# Patient Record
Sex: Male | Born: 1945 | Race: White | Hispanic: No | Marital: Married | Smoking: Never smoker
Health system: Southern US, Community
[De-identification: ages and names within clinical notes are randomized; demographics above are authoritative.]

## PROBLEM LIST (undated history)

## (undated) DIAGNOSIS — T7840XA Allergy, unspecified, initial encounter: Secondary | ICD-10-CM

## (undated) DIAGNOSIS — J45909 Unspecified asthma, uncomplicated: Secondary | ICD-10-CM

## (undated) DIAGNOSIS — I509 Heart failure, unspecified: Secondary | ICD-10-CM

## (undated) DIAGNOSIS — I219 Acute myocardial infarction, unspecified: Secondary | ICD-10-CM

## (undated) DIAGNOSIS — M1990 Unspecified osteoarthritis, unspecified site: Secondary | ICD-10-CM

## (undated) DIAGNOSIS — I10 Essential (primary) hypertension: Secondary | ICD-10-CM

## (undated) DIAGNOSIS — G4730 Sleep apnea, unspecified: Secondary | ICD-10-CM

## (undated) HISTORY — PX: ROTATOR CUFF REPAIR: SHX139

## (undated) HISTORY — DX: Allergy, unspecified, initial encounter: T78.40XA

## (undated) HISTORY — PX: TONSILLECTOMY: SUR1361

---

## 1988-11-19 HISTORY — PX: FINGER ARTHROSCOPY: SHX5001

## 2004-11-19 HISTORY — PX: BACK SURGERY: SHX140

## 2005-06-08 ENCOUNTER — Ambulatory Visit (HOSPITAL_COMMUNITY): Admission: RE | Admit: 2005-06-08 | Discharge: 2005-06-09 | Payer: Self-pay | Admitting: Neurosurgery

## 2006-11-04 ENCOUNTER — Encounter: Admission: RE | Admit: 2006-11-04 | Discharge: 2006-11-04 | Payer: Self-pay | Admitting: Family Medicine

## 2007-07-18 ENCOUNTER — Emergency Department: Payer: Self-pay | Admitting: Emergency Medicine

## 2011-05-16 ENCOUNTER — Emergency Department: Payer: Self-pay | Admitting: Emergency Medicine

## 2011-06-11 ENCOUNTER — Encounter: Payer: Self-pay | Admitting: Physician Assistant

## 2011-06-20 ENCOUNTER — Encounter: Payer: Self-pay | Admitting: Physician Assistant

## 2011-07-21 ENCOUNTER — Encounter: Payer: Self-pay | Admitting: Physician Assistant

## 2011-09-06 ENCOUNTER — Ambulatory Visit: Payer: Self-pay | Admitting: Orthopedic Surgery

## 2015-11-14 DIAGNOSIS — K429 Umbilical hernia without obstruction or gangrene: Secondary | ICD-10-CM | POA: Diagnosis not present

## 2015-11-14 DIAGNOSIS — K8000 Calculus of gallbladder with acute cholecystitis without obstruction: Secondary | ICD-10-CM | POA: Diagnosis not present

## 2015-11-14 DIAGNOSIS — K8020 Calculus of gallbladder without cholecystitis without obstruction: Secondary | ICD-10-CM | POA: Diagnosis not present

## 2015-11-15 ENCOUNTER — Emergency Department: Payer: Medicare Other | Admitting: Anesthesiology

## 2015-11-15 ENCOUNTER — Emergency Department: Payer: Medicare Other

## 2015-11-15 ENCOUNTER — Observation Stay
Admission: EM | Admit: 2015-11-15 | Discharge: 2015-11-16 | Disposition: A | Discharge status: Home / Self Care | Payer: Medicare Other | Attending: General Surgery | Admitting: General Surgery

## 2015-11-15 ENCOUNTER — Encounter
Admission: EM | Disposition: A | Discharge status: Home / Self Care | Payer: Self-pay | Source: Home / Self Care | Attending: Emergency Medicine

## 2015-11-15 ENCOUNTER — Encounter: Payer: Self-pay | Admitting: Emergency Medicine

## 2015-11-15 DIAGNOSIS — R1013 Epigastric pain: Secondary | ICD-10-CM | POA: Diagnosis not present

## 2015-11-15 DIAGNOSIS — K8000 Calculus of gallbladder with acute cholecystitis without obstruction: Secondary | ICD-10-CM | POA: Diagnosis not present

## 2015-11-15 DIAGNOSIS — R918 Other nonspecific abnormal finding of lung field: Secondary | ICD-10-CM | POA: Insufficient documentation

## 2015-11-15 DIAGNOSIS — J449 Chronic obstructive pulmonary disease, unspecified: Secondary | ICD-10-CM | POA: Insufficient documentation

## 2015-11-15 DIAGNOSIS — Z7982 Long term (current) use of aspirin: Secondary | ICD-10-CM | POA: Insufficient documentation

## 2015-11-15 DIAGNOSIS — Z79899 Other long term (current) drug therapy: Secondary | ICD-10-CM | POA: Insufficient documentation

## 2015-11-15 DIAGNOSIS — R079 Chest pain, unspecified: Secondary | ICD-10-CM | POA: Insufficient documentation

## 2015-11-15 DIAGNOSIS — N644 Mastodynia: Secondary | ICD-10-CM | POA: Diagnosis not present

## 2015-11-15 DIAGNOSIS — K810 Acute cholecystitis: Secondary | ICD-10-CM

## 2015-11-15 DIAGNOSIS — I10 Essential (primary) hypertension: Secondary | ICD-10-CM | POA: Diagnosis present

## 2015-11-15 DIAGNOSIS — K429 Umbilical hernia without obstruction or gangrene: Secondary | ICD-10-CM | POA: Insufficient documentation

## 2015-11-15 HISTORY — PX: CHOLECYSTECTOMY: SHX55

## 2015-11-15 HISTORY — DX: Sleep apnea, unspecified: G47.30

## 2015-11-15 HISTORY — PX: UMBILICAL HERNIA REPAIR: SHX196

## 2015-11-15 HISTORY — DX: Essential (primary) hypertension: I10

## 2015-11-15 LAB — LIPASE, BLOOD: LIPASE: 31 U/L (ref 11–51)

## 2015-11-15 LAB — CBC
HEMATOCRIT: 48.9 % (ref 40.0–52.0)
HEMOGLOBIN: 16.4 g/dL (ref 13.0–18.0)
MCH: 30.9 pg (ref 26.0–34.0)
MCHC: 33.5 g/dL (ref 32.0–36.0)
MCV: 92.1 fL (ref 80.0–100.0)
Platelets: 114 10*3/uL — ABNORMAL LOW (ref 150–440)
RBC: 5.31 MIL/uL (ref 4.40–5.90)
RDW: 14 % (ref 11.5–14.5)
WBC: 9 10*3/uL (ref 3.8–10.6)

## 2015-11-15 LAB — HEPATIC FUNCTION PANEL
ALBUMIN: 4.1 g/dL (ref 3.5–5.0)
ALK PHOS: 52 U/L (ref 38–126)
ALT: 28 U/L (ref 17–63)
AST: 23 U/L (ref 15–41)
Bilirubin, Direct: <0.1 mg/dL — ABNORMAL LOW (ref 0.1–0.5)
TOTAL PROTEIN: 6.7 g/dL (ref 6.5–8.1)
Total Bilirubin: 0.5 mg/dL (ref 0.3–1.2)

## 2015-11-15 LAB — TROPONIN I: Troponin I: <0.03 ng/mL (ref <0.031)

## 2015-11-15 LAB — BASIC METABOLIC PANEL
Anion gap: 7 (ref 5–15)
BUN: 15 mg/dL (ref 6–20)
CHLORIDE: 105 mmol/L (ref 101–111)
CO2: 25 mmol/L (ref 22–32)
CREATININE: 1.17 mg/dL (ref 0.61–1.24)
Calcium: 8.8 mg/dL — ABNORMAL LOW (ref 8.9–10.3)
GFR calc non Af Amer: >60 mL/min (ref >60)
Glucose, Bld: 107 mg/dL — ABNORMAL HIGH (ref 65–99)
POTASSIUM: 3.9 mmol/L (ref 3.5–5.1)
SODIUM: 137 mmol/L (ref 135–145)

## 2015-11-15 SURGERY — LAPAROSCOPIC CHOLECYSTECTOMY WITH INTRAOPERATIVE CHOLANGIOGRAM
Anesthesia: General | Site: Abdomen | Wound class: Clean Contaminated

## 2015-11-15 MED ORDER — LACTATED RINGERS IV SOLN
INTRAVENOUS | Status: DC | PRN
  Administered 2015-11-15 (×2): via INTRAVENOUS

## 2015-11-15 MED ORDER — LABETALOL HCL 5 MG/ML IV SOLN: 10.0000 mg | Freq: Once | INTRAVENOUS | Status: DC

## 2015-11-15 MED ORDER — SODIUM CHLORIDE 0.9 % IJ SOLN
INTRAMUSCULAR | Status: AC
  Filled 2015-11-15: qty 50

## 2015-11-15 MED ORDER — ONDANSETRON HCL 4 MG/2ML IJ SOLN
4.0000 mg | Freq: Once | INTRAMUSCULAR | Status: AC
  Administered 2015-11-15: 4 mg via INTRAVENOUS
  Filled 2015-11-15: qty 2

## 2015-11-15 MED ORDER — CEFAZOLIN SODIUM-DEXTROSE 2-3 GM-% IV SOLR
2.0000 g | Freq: Once | INTRAVENOUS | Status: AC
Start: 2015-11-15 — End: 2015-11-15
  Administered 2015-11-15: 2 g via INTRAVENOUS
  Filled 2015-11-15 (×2): qty 50

## 2015-11-15 MED ORDER — SODIUM CHLORIDE 0.9 % IV SOLN
Freq: Once | INTRAVENOUS | Status: AC
  Administered 2015-11-15: 21:00:00 via INTRAVENOUS

## 2015-11-15 MED ORDER — ACETAMINOPHEN 10 MG/ML IV SOLN
INTRAVENOUS | Status: AC
  Filled 2015-11-15: qty 100

## 2015-11-15 MED ORDER — MORPHINE SULFATE (PF) 4 MG/ML IV SOLN
4.0000 mg | Freq: Once | INTRAVENOUS | Status: AC
  Administered 2015-11-15: 4 mg via INTRAVENOUS
  Filled 2015-11-15: qty 1

## 2015-11-15 SURGICAL SUPPLY — 38 items
ANCHOR TIS RET SYS 235ML (MISCELLANEOUS) ×5 IMPLANT
APPLICATOR SURGIFLO (MISCELLANEOUS) IMPLANT
APPLIER CLIP LOGIC TI 5 (MISCELLANEOUS) ×5 IMPLANT
BLADE SURG 11 STRL SS SAFETY (MISCELLANEOUS) ×5 IMPLANT
CANISTER SUCT 1200ML W/VALVE (MISCELLANEOUS) ×5 IMPLANT
CANNULA DILATOR 10 W/SLV (CANNULA) ×4 IMPLANT
CANNULA DILATOR 10MM W/SLV (CANNULA) ×1
CATH CHOLANG 76X19 KUMAR (CATHETERS) IMPLANT
CHLORAPREP W/TINT 26ML (MISCELLANEOUS) ×5 IMPLANT
CLOSURE WOUND 1/2 X4 (GAUZE/BANDAGES/DRESSINGS) ×1
DEFOGGER SCOPE WARMER CLEARIFY (MISCELLANEOUS) ×5 IMPLANT
DRAPE C-ARM XRAY 36X54 (DRAPES) IMPLANT
DRAPE INCISE IOBAN 66X45 STRL (DRAPES) ×5 IMPLANT
DRESSING TELFA 4X3 1S ST N-ADH (GAUZE/BANDAGES/DRESSINGS) ×5 IMPLANT
DRSG TEGADERM 2-3/8X2-3/4 SM (GAUZE/BANDAGES/DRESSINGS) ×20 IMPLANT
GLOVE BIO SURGEON STRL SZ7 (GLOVE) ×25 IMPLANT
GOWN STRL REUS W/ TWL LRG LVL3 (GOWN DISPOSABLE) ×9 IMPLANT
GOWN STRL REUS W/TWL LRG LVL3 (GOWN DISPOSABLE) ×6
GRASPER SUT TROCAR 14GX15 (MISCELLANEOUS) IMPLANT
HEMOSTAT SURGICEL 2X3 (HEMOSTASIS) IMPLANT
IRRIGATION STRYKERFLOW (MISCELLANEOUS) ×3 IMPLANT
IRRIGATOR STRYKERFLOW (MISCELLANEOUS) ×5
IV LACTATED RINGERS 1000ML (IV SOLUTION) ×5 IMPLANT
KIT RM TURNOVER STRD PROC AR (KITS) ×5 IMPLANT
LABEL OR SOLS (LABEL) ×5 IMPLANT
NDL INSUFF ACCESS 14 VERSASTEP (NEEDLE) ×5 IMPLANT
PACK LAP CHOLECYSTECTOMY (MISCELLANEOUS) ×5 IMPLANT
PAD GROUND ADULT SPLIT (MISCELLANEOUS) ×5 IMPLANT
SCISSORS METZENBAUM CVD 33 (INSTRUMENTS) ×5 IMPLANT
SLEEVE ENDOPATH XCEL 5M (ENDOMECHANICALS) ×10 IMPLANT
SPOGE SURGIFLO 8M (HEMOSTASIS)
SPONGE SURGIFLO 8M (HEMOSTASIS) IMPLANT
STRIP CLOSURE SKIN 1/2X4 (GAUZE/BANDAGES/DRESSINGS) ×4 IMPLANT
SUT VIC AB 0 SH 27 (SUTURE) ×5 IMPLANT
SUT VIC AB 4-0 FS2 27 (SUTURE) ×5 IMPLANT
SWABSTK COMLB BENZOIN TINCTURE (MISCELLANEOUS) ×5 IMPLANT
TROCAR XCEL NON-BLD 5MMX100MML (ENDOMECHANICALS) ×5 IMPLANT
TUBING INSUFFLATOR HI FLOW (MISCELLANEOUS) ×5 IMPLANT

## 2015-11-15 NOTE — ED Notes (Signed)
Lab called for add on of blood work.

## 2015-11-15 NOTE — Anesthesia Preprocedure Evaluation (Signed)
Anesthesia Evaluation  Patient identified by MRN, date of birth, ID band Patient awake    Reviewed: Allergy & Precautions, NPO status , Patient's Chart, lab work & pertinent test results, reviewed documented beta blocker date and time   History of Anesthesia Complications Negative for: history of anesthetic complications  Airway Mallampati: III       Dental   Pulmonary sleep apnea and Continuous Positive Airway Pressure Ventilation , COPD,  COPD inhaler,           Cardiovascular hypertension, Pt. on medications and Pt. 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      Musculoskeletal   Abdominal   Peds  Hematology negative hematology ROS (+)   Anesthesia Other Findings   Reproductive/Obstetrics                             Anesthesia Physical Anesthesia Plan  ASA: III and emergent  Anesthesia Plan: General   Post-op Pain Management:    Induction: Intravenous  Airway Management Planned: Oral ETT  Additional Equipment:   Intra-op Plan:   Post-operative Plan:   Informed Consent: I have reviewed the patients History and Physical, chart, labs and discussed the procedure including the risks, benefits and alternatives for the proposed anesthesia with the patient or authorized representative who has indicated his/her understanding and acceptance.     Plan Discussed with:   Anesthesia Plan Comments:         Anesthesia Quick Evaluation

## 2015-11-15 NOTE — H&P (Signed)
Walter Anderson is an 69 y.o. male.   Chief Complaint: Abdominal pain HPI: This is a 69 year old male who in November had an episode of severe pain in the epigastric region. He could not get comfortable but the pain passed away in a couple of hours. About 3 days ago he had another episode of similar pain and lasted a couple of hours and resolved again. This morning around 7-8:00 he developed the same epigastric pain but this time have persisted and constant fashion. The pain is localized this region and does not radiate. No associated symptoms of  nausea or vomiting fever or chills.  Past Medical History  Diagnosis Date  . Hypertension   . COPD (chronic obstructive pulmonary disease) ([place])     History reviewed. No pertinent past surgical history.  History reviewed. No pertinent family history. Social History:  reports that he has never smoked. He does not have any smokeless tobacco history on file. He reports that he drinks alcohol. His drug history is not on file.  Allergies: No Known Allergies   (Not in a hospital admission)  Results for orders placed or performed during the hospital encounter of 11/15/15 (from the past 48 hour(s))  Basic metabolic panel     Status: Abnormal   Collection Time: 11/15/15  2:57 PM  Result Value Ref Range   Sodium 137 135 - 145 mmol/L   Potassium 3.9 3.5 - 5.1 mmol/L   Chloride 105 101 - 111 mmol/L   CO2 25 22 - 32 mmol/L   Glucose, Bld 107 (H) 65 - 99 mg/dL   BUN 15 6 - 20 mg/dL   Creatinine, Ser 1.17 0.61 - 1.24 mg/dL   Calcium 8.8 (L) 8.9 - 10.3 mg/dL   GFR calc non Af Amer >60 >60 mL/min   GFR calc Af Amer >60 >60 mL/min    Comment: (NOTE) The eGFR has been calculated using the CKD EPI equation. This calculation has not been validated in all clinical situations. eGFR's persistently <60 mL/min signify possible Chronic Kidney Disease.    Anion gap 7 5 - 15  Troponin I     Status: None   Collection Time: 11/15/15  2:57 PM  Result Value Ref  Range   Troponin I <0.03 <0.031 ng/mL    Comment:        NO INDICATION OF MYOCARDIAL INJURY.   CBC     Status: Abnormal   Collection Time: 11/15/15  2:57 PM  Result Value Ref Range   WBC 9.0 3.8 - 10.6 K/uL   RBC 5.31 4.40 - 5.90 MIL/uL   Hemoglobin 16.4 13.0 - 18.0 g/dL   HCT 48.9 40.0 - 52.0 %   MCV 92.1 80.0 - 100.0 fL   MCH 30.9 26.0 - 34.0 pg   MCHC 33.5 32.0 - 36.0 g/dL   RDW 14.0 11.5 - 14.5 %   Platelets 114 (L) 150 - 440 K/uL  Lipase, blood     Status: None   Collection Time: 11/15/15  2:57 PM  Result Value Ref Range   Lipase 31 11 - 51 U/L  Hepatic function panel     Status: Abnormal   Collection Time: 11/15/15  2:57 PM  Result Value Ref Range   Total Protein 6.7 6.5 - 8.1 g/dL   Albumin 4.1 3.5 - 5.0 g/dL   AST 23 15 - 41 U/L   ALT 28 17 - 63 U/L   Alkaline Phosphatase 52 38 - 126 U/L   Total Bilirubin 0.5  0.3 - 1.2 mg/dL   Bilirubin, Direct <0.1 (L) 0.1 - 0.5 mg/dL   Indirect Bilirubin NOT CALCULATED 0.3 - 0.9 mg/dL  Troponin I     Status: None   Collection Time: 11/15/15  6:56 PM  Result Value Ref Range   Troponin I <0.03 <0.031 ng/mL    Comment:        NO INDICATION OF MYOCARDIAL INJURY.    Dg Chest 2 View  11/15/2015  CLINICAL DATA:  69 year old male with pain radiating across the diaphragm and pain in the right upper quadrant and right breast as well as some shortness of breath. EXAM: CHEST  2 VIEW COMPARISON:  Prior chest x-ray 06/06/2005 FINDINGS: Cardiac and mediastinal contours remain within normal limits. Trace atherosclerosis in the transverse aorta. Stable chronic bronchitic change in interstitial prominence. No focal airspace consolidation, pulmonary edema, pleural effusion or pneumothorax. No suspicious mass or nodule. No acute osseous abnormality. IMPRESSION: No active cardiopulmonary disease. Electronically Signed   By: Jacqulynn Cadet M.D.   On: 11/15/2015 15:22   US Abdomen Limited Ruq  11/15/2015  CLINICAL DATA:  Epigastric pain for 1  month. EXAM: US ABDOMEN LIMITED - RIGHT UPPER QUADRANT COMPARISON:  None. FINDINGS: Gallbladder: Cholelithiasis is noted with mild gallbladder wall thickening measuring 4.2 mm. Mild pericholecystic fluid is noted. No sonographic Murphy's sign was reported. Largest stone measures 8 mm in diameter. Common bile duct: Diameter: 3.3 mm which is within normal limits. Liver: No focal lesion identified. Within normal limits in parenchymal echogenicity. IMPRESSION: Cholelithiasis with mild gallbladder wall thickening and mild pericholecystic fluid concerning for acute cholecystitis. HIDA scan may be performed for further evaluation. Electronically Signed   By: Marijo Conception, M.D.   On: 11/15/2015 19:53    Review of Systems  Constitutional: Negative for fever and chills.  HENT: Negative.   Respiratory: Positive for shortness of breath ( only with pain in epigastric area). Negative for cough, hemoptysis, sputum production and wheezing.   Cardiovascular: Negative.   Gastrointestinal: Positive for abdominal pain ( epigastric cobstant since 7 am today). Negative for heartburn, nausea, vomiting, diarrhea and constipation.  Genitourinary: Negative.   Neurological: Negative.     Blood pressure 195/104, pulse 74, temperature 98.1 F (36.7 C), temperature source Oral, resp. rate 20, height 6' (1.829 m), weight 280 lb (127.007 kg), SpO2 97 %. Physical Exam  Constitutional: He is oriented to person, place, and time. He appears well-developed and well-nourished.  Moderately obese, in NAD  Eyes: Conjunctivae are normal. No scleral icterus.  Neck: Neck supple. No tracheal deviation present. No thyroid mass and no thyromegaly present.  Cardiovascular: Normal rate, regular rhythm and normal heart sounds.   Respiratory: Effort normal and breath sounds normal.  GI: Soft. Bowel sounds are normal. There is no hepatomegaly. There is tenderness in the epigastric area. There is positive Murphy's sign. There is no rigidity,  no rebound and no guarding. A hernia is present.    Lymphadenopathy:    He has no cervical adenopathy.  Neurological: He is alert and oriented to person, place, and time.  Skin: Skin is warm and intact.  Psychiatric: He has a normal mood and affect. His speech is normal and behavior is normal. Judgment and thought content normal. Cognition and memory are normal.     Assessment/Plan Clinical findings and ultrasound supporting diagnosis of acute cholecystitis with cholelithiasis. Lab data were normal and showed no evidence of pancreatitis or   common duct involvement. Patient was advised fully on the findings  and recommended a cholecystectomy. Laparoscopic approach possible need for open cholecystectomy risks and benefits were all fully explained to the patient. He is agreeable to the procedure.  Lura Falor G 11/15/2015, 9:26 PM

## 2015-11-15 NOTE — ED Provider Notes (Signed)
[facility] Emergency Department Provider Note  ____________________________________________  Time seen: Approximately 6:55 PM  I have reviewed the triage vital signs and the nursing notes.   HISTORY  Chief Complaint Chest Pain    HPI Walter Anderson is a 69 y.o. male with a history of obesity, hypertension, and COPD who presents with about 4 days of intermittent upper abdominal and/or chest pain.  It tends to occur after he eats, although this is not consistent.  Gradual onset, lasts several hours.  Nothing makes better, nothing specifically makes worse.  Sharp and stabbing and aching at top of abdomen or lower thorax.    Constant pain since about 7am today, stabbing and aching.  Denies SOB, vomiting (some nausea though), diarrhea.  Describes pain as moderate but has a high tolerance for pain.  Denies fever/chills.   Past Medical History  Diagnosis Date  . Hypertension   . COPD (chronic obstructive pulmonary disease) ([facility])     There are no active problems to display for this patient.   History reviewed. No pertinent past surgical history.  No current outpatient prescriptions on file.  Allergies Review of patient's allergies indicates no known allergies.  History reviewed. No pertinent family history.  Social History Social History  Substance Use Topics  . Smoking status: Never Smoker   . Smokeless tobacco: None  . Alcohol Use: Yes    Review of Systems Constitutional: No fever/chills Eyes: No visual changes. ENT: No sore throat. Cardiovascular: Lower chest vs upper abdominal pain Respiratory: Denies shortness of breath. Gastrointestinal: Upper abdominal pain.  No nausea, no vomiting.  No diarrhea.  No constipation. Genitourinary: Negative for dysuria. Musculoskeletal: Negative for back pain. Skin: Negative for rash. Neurological: Negative for headaches, focal weakness or numbness.  10-point ROS otherwise  negative.  ____________________________________________   PHYSICAL EXAM:  VITAL SIGNS: ED Triage Vitals  Enc Vitals Group     BP 11/15/15 1448 196/106 mmHg     Pulse Rate 11/15/15 1448 75     Resp 11/15/15 1448 20     Temp 11/15/15 1448 98.1 F (36.7 C)     Temp Source 11/15/15 1448 Oral     SpO2 11/15/15 1448 96 %     Weight 11/15/15 1448 280 lb (127.007 kg)     Height 11/15/15 1448 6' (1.829 m)     Head Cir --      Peak Flow --      Pain Score 11/15/15 1446 5     Pain Loc --      Pain Edu? --      Excl. in GC? --     Constitutional: Alert and oriented. Well appearing and in no acute distress. Eyes: Conjunctivae are normal. PERRL. EOMI. Head: Atraumatic. Nose: No congestion/rhinnorhea. Mouth/Throat: Mucous membranes are moist.  Oropharynx non-erythematous. Neck: No stridor.   Cardiovascular: Normal rate, regular rhythm. Grossly normal heart sounds.  Good peripheral circulation. Respiratory: Normal respiratory effort.  No retractions. Lungs CTAB. Gastrointestinal: Obese, Soft and "uncomfortable" to palpation but no acute pain/tenderness. No distention. No abdominal bruits. No CVA tenderness. Musculoskeletal: No lower extremity tenderness nor edema.  No joint effusions. Neurologic:  Normal speech and language. No gross focal neurologic deficits are appreciated.  Skin:  Skin is warm, dry and intact. No rash noted. Psychiatric: Mood and affect are normal. Speech and behavior are normal.  ____________________________________________   LABS (all labs ordered are listed, but only abnormal results are displayed)  Labs Reviewed  BASIC METABOLIC PANEL -  Abnormal; Notable for the following:    Glucose, Bld 107 (*)    Calcium 8.8 (*)    All other components within normal limits  CBC - Abnormal; Notable for the following:    Platelets 114 (*)    All other components within normal limits  HEPATIC FUNCTION PANEL - Abnormal; Notable for the following:    Bilirubin, Direct <0.1  (*)    All other components within normal limits  TROPONIN I  TROPONIN I  LIPASE, BLOOD   ____________________________________________  EKG  ED ECG REPORT #1 I, Beckam Abdulaziz, the attending physician, personally viewed and interpreted this ECG.  Date: 11/15/2015 EKG Time: 14:44 Rate: 74 Rhythm: normal sinus rhythm QRS Axis: normal Intervals: normal ST/T Wave abnormalities: normal Conduction Disutrbances: none Narrative Interpretation: unremarkable  ED ECG REPORT #2 I, Darey Hershberger, the attending physician, personally viewed and interpreted this ECG.  Date: 11/15/2015 EKG Time: 19:57 Rate: 65 Rhythm: normal sinus rhythm QRS Axis: normal Intervals: normal ST/T Wave abnormalities: normal Conduction Disutrbances: none Narrative Interpretation: unremarkable   ____________________________________________  RADIOLOGY   Dg Chest 2 View  11/15/2015  CLINICAL DATA:  69 year old male with pain radiating across the diaphragm and pain in the right upper quadrant and right breast as well as some shortness of breath. EXAM: CHEST  2 VIEW COMPARISON:  Prior chest x-ray 06/06/2005 FINDINGS: Cardiac and mediastinal contours remain within normal limits. Trace atherosclerosis in the transverse aorta. Stable chronic bronchitic change in interstitial prominence. No focal airspace consolidation, pulmonary edema, pleural effusion or pneumothorax. No suspicious mass or nodule. No acute osseous abnormality. IMPRESSION: No active cardiopulmonary disease. Electronically Signed   By: Malachy MoanHeath  McCullough M.D.   On: 11/15/2015 15:22   [place] Abdomen Limited Ruq  11/15/2015  CLINICAL DATA:  Epigastric pain for 1 month. EXAM: US ABDOMEN LIMITED - RIGHT UPPER QUADRANT COMPARISON:  None. FINDINGS: Gallbladder: Cholelithiasis is noted with mild gallbladder wall thickening measuring 4.2 mm. Mild pericholecystic fluid is noted. No sonographic Murphy's sign was reported. Largest stone measures 8 mm in diameter.  Common bile duct: Diameter: 3.3 mm which is within normal limits. Liver: No focal lesion identified. Within normal limits in parenchymal echogenicity. IMPRESSION: Cholelithiasis with mild gallbladder wall thickening and mild pericholecystic fluid concerning for acute cholecystitis. HIDA scan may be performed for further evaluation. Electronically Signed   By: Lupita RaiderJames  Green Jr, M.D.   On: 11/15/2015 19:53    ____________________________________________   PROCEDURES  Procedure(s) performed: None  Critical Care performed: No ____________________________________________   INITIAL IMPRESSION / ASSESSMENT AND PLAN / ED COURSE  Pertinent labs & imaging results that were available during my care of the patient were reviewed by me and considered in my medical decision making (see chart for details).  Initially he was complaining of pain in the chest, but the more he rubs his stomach it is clear the pain is actually in the epigastrium.  When it initially started it seemed to be primarily after he ate and his wife appropriately suggests the possibility of biliary colic.  I obtained a right upper quadrant ultrasound and his ultrasound is concerning for cholecystitis with gallbladder wall thickening and some pericholecystic fluid.  I am treating empirically with cefazolin 2 g IV, IV fluids, nothing by mouth, and will call surgery as soon as the remainder of his labs come back.  ----------------------------------------- 8:50 PM on 11/15/2015 -----------------------------------------  Spoke with him with Dr. Evette CristalSankar approximately 30 minutes ago about the patient and he is coming to the emergency  department to personally evaluate and admit the patient.patient and wife understand and agree with the plan. ____________________________________________  FINAL CLINICAL IMPRESSION(S) / ED DIAGNOSES  Final diagnoses:  Acute cholecystitis  Essential hypertension      NEW MEDICATIONS STARTED DURING THIS  VISIT:  New Prescriptions   No medications on file     Loleta Rose, MD 11/15/15 2054

## 2015-11-15 NOTE — Anesthesia Procedure Notes (Signed)
Procedure Name: Intubation Date/Time: 11/15/2015 11:01 PM Performed by: Walter Anderson, Walter Anderson Pre-anesthesia Checklist: Patient identified, Emergency Drugs available, Suction available, Patient being monitored and Timeout performed Patient Re-evaluated:Patient Re-evaluated prior to inductionOxygen Delivery Method: Circle system utilized Preoxygenation: Pre-oxygenation with 100% oxygen Intubation Type: IV induction Ventilation: Mask ventilation with difficulty Laryngoscope Size: Miller, 2, Mac and 4 Grade View: Grade IV Tube size: 7.5 mm Number of attempts: 4 Airway Equipment and Method: Stylet,  Bougie stylet and Video-laryngoscopy Placement Confirmation: positive ETCO2,  ETT inserted through vocal cords under direct vision and breath sounds checked- equal and bilateral Secured at: 23 cm Tube secured with: Tape Dental Injury: Teeth and Oropharynx as per pre-operative assessment  Difficulty Due To: Difficulty was anticipated, Difficult Airway- due to large tongue and Difficult Airway- due to anterior larynx Future Recommendations: Recommend- induction with short-acting agent, and alternative techniques readily available

## 2015-11-15 NOTE — ED Notes (Addendum)
Developed chest pain this am about 7am denies any n/v or diaphoresis  Slight SOB   Per family he has had 3 episodes over the past 3 -4 days

## 2015-11-16 ENCOUNTER — Encounter: Payer: Self-pay | Admitting: *Deleted

## 2015-11-16 DIAGNOSIS — K8000 Calculus of gallbladder with acute cholecystitis without obstruction: Secondary | ICD-10-CM | POA: Diagnosis present

## 2015-11-16 MED ORDER — IRBESARTAN 75 MG PO TABS
300.0000 mg | ORAL_TABLET | Freq: Every day | ORAL | Status: DC
  Administered 2015-11-16: 300 mg via ORAL
  Filled 2015-11-16: qty 4

## 2015-11-16 MED ORDER — ONDANSETRON HCL 4 MG/2ML IJ SOLN
INTRAMUSCULAR | Status: DC | PRN
  Administered 2015-11-16: 4 mg via INTRAVENOUS

## 2015-11-16 MED ORDER — ASPIRIN 81 MG PO CHEW
81.0000 mg | CHEWABLE_TABLET | Freq: Every day | ORAL | Status: DC
  Administered 2015-11-16: 81 mg via ORAL
  Filled 2015-11-16: qty 1

## 2015-11-16 MED ORDER — ACETAMINOPHEN 650 MG RE SUPP: 650.0000 mg | Freq: Four times a day (QID) | RECTAL | Status: DC | PRN

## 2015-11-16 MED ORDER — MORPHINE SULFATE (PF) 2 MG/ML IV SOLN: 2.0000 mg | INTRAVENOUS | Status: DC | PRN

## 2015-11-16 MED ORDER — DEXAMETHASONE SODIUM PHOSPHATE 10 MG/ML IJ SOLN
INTRAMUSCULAR | Status: DC | PRN
  Administered 2015-11-15: 10 mg via INTRAVENOUS

## 2015-11-16 MED ORDER — ACETAMINOPHEN 325 MG PO TABS
650.0000 mg | ORAL_TABLET | Freq: Four times a day (QID) | ORAL | Status: DC | PRN
Start: 2015-11-16 — End: 2015-11-16

## 2015-11-16 MED ORDER — SUCCINYLCHOLINE CHLORIDE 20 MG/ML IJ SOLN
INTRAMUSCULAR | Status: DC | PRN
  Administered 2015-11-15 (×2): 120 mg via INTRAVENOUS

## 2015-11-16 MED ORDER — OXYCODONE-ACETAMINOPHEN 5-325 MG PO TABS: 1.0000 | ORAL_TABLET | ORAL | Status: DC | PRN

## 2015-11-16 MED ORDER — DEXTROSE-NACL 5-0.45 % IV SOLN
INTRAVENOUS | Status: DC
  Administered 2015-11-16: 02:00:00 via INTRAVENOUS

## 2015-11-16 MED ORDER — LIDOCAINE HCL (CARDIAC) 20 MG/ML IV SOLN
INTRAVENOUS | Status: DC | PRN
  Administered 2015-11-15: 50 mg via INTRAVENOUS

## 2015-11-16 MED ORDER — ALBUTEROL SULFATE (2.5 MG/3ML) 0.083% IN NEBU: 3.0000 mL | INHALATION_SOLUTION | Freq: Four times a day (QID) | RESPIRATORY_TRACT | Status: DC | PRN

## 2015-11-16 MED ORDER — PROPOFOL 10 MG/ML IV BOLUS
INTRAVENOUS | Status: DC | PRN
  Administered 2015-11-15: 200 mg via INTRAVENOUS
  Administered 2015-11-15: 150 mg via INTRAVENOUS

## 2015-11-16 MED ORDER — GLYCOPYRROLATE 0.2 MG/ML IJ SOLN
INTRAMUSCULAR | Status: DC | PRN
  Administered 2015-11-16: 0.6 mg via INTRAVENOUS

## 2015-11-16 MED ORDER — OMEGA-3-ACID ETHYL ESTERS 1 G PO CAPS
2.0000 g | ORAL_CAPSULE | Freq: Every day | ORAL | Status: DC
  Administered 2015-11-16: 2 g via ORAL
  Filled 2015-11-16: qty 2

## 2015-11-16 MED ORDER — DOXAZOSIN MESYLATE 4 MG PO TABS
4.0000 mg | ORAL_TABLET | Freq: Two times a day (BID) | ORAL | Status: DC
  Administered 2015-11-16: 4 mg via ORAL
  Filled 2015-11-16: qty 1

## 2015-11-16 MED ORDER — METOPROLOL SUCCINATE ER 50 MG PO TB24
50.0000 mg | ORAL_TABLET | Freq: Every day | ORAL | Status: DC
  Administered 2015-11-16: 50 mg via ORAL
  Filled 2015-11-16: qty 1

## 2015-11-16 MED ORDER — POTASSIUM CHLORIDE CRYS ER 20 MEQ PO TBCR
20.0000 meq | EXTENDED_RELEASE_TABLET | Freq: Every day | ORAL | Status: DC
  Administered 2015-11-16: 20 meq via ORAL
  Filled 2015-11-16: qty 1

## 2015-11-16 MED ORDER — COENZYME Q10 400 MG PO CAPS: 1.0000 | ORAL_CAPSULE | Freq: Every day | ORAL | Status: DC

## 2015-11-16 MED ORDER — TESTOSTERONE 50 MG/5GM (1%) TD GEL
5.0000 g | Freq: Every day | TRANSDERMAL | Status: DC
Start: 2015-11-16 — End: 2015-11-16

## 2015-11-16 MED ORDER — LACTATED RINGERS IR SOLN
Status: DC | PRN
  Administered 2015-11-16: 1000 mL

## 2015-11-16 MED ORDER — MIDAZOLAM HCL 2 MG/2ML IJ SOLN
INTRAMUSCULAR | Status: DC | PRN
  Administered 2015-11-15 (×2): 2 mg via INTRAVENOUS

## 2015-11-16 MED ORDER — ACETAMINOPHEN 10 MG/ML IV SOLN
INTRAVENOUS | Status: DC | PRN
  Administered 2015-11-16: 1000 mg via INTRAVENOUS

## 2015-11-16 MED ORDER — FENTANYL CITRATE (PF) 100 MCG/2ML IJ SOLN: 25.0000 ug | INTRAMUSCULAR | Status: DC | PRN

## 2015-11-16 MED ORDER — PSYLLIUM 95 % PO PACK
2.0000 | PACK | Freq: Every day | ORAL | Status: DC
  Administered 2015-11-16: 2 via ORAL
  Filled 2015-11-16: qty 2

## 2015-11-16 MED ORDER — ADULT MULTIVITAMIN W/MINERALS CH
1.0000 | ORAL_TABLET | Freq: Every day | ORAL | Status: DC
  Administered 2015-11-16: 1 via ORAL
  Filled 2015-11-16: qty 1

## 2015-11-16 MED ORDER — ONDANSETRON HCL 4 MG/2ML IJ SOLN: 4.0000 mg | Freq: Once | INTRAMUSCULAR | Status: DC | PRN

## 2015-11-16 MED ORDER — NEOSTIGMINE METHYLSULFATE 10 MG/10ML IV SOLN
INTRAVENOUS | Status: DC | PRN
  Administered 2015-11-16: 4 mg via INTRAVENOUS

## 2015-11-16 MED ORDER — ROCURONIUM BROMIDE 100 MG/10ML IV SOLN
INTRAVENOUS | Status: DC | PRN
  Administered 2015-11-15: 30 mg via INTRAVENOUS

## 2015-11-16 NOTE — Discharge Summary (Signed)
Physician Discharge Summary  Patient ID: Walter Anderson MRN: [id number] DOB/AGE: 67/02/1946 67 y.o.  Admit date: 11/15/2015 Discharge date: 11/16/2015  Admission Diagnoses:Acute cholecystitis  Discharge Diagnoses:  Active Problems:   Cholecystitis, acute with cholelithiasis   Discharged Condition: good  Hospital Course: 69 yr old male with acute cholecystitis.  He underwent lap chole overnight and is doing well this AM. He denis any pain, has been up and walking around and has been eating well.    Consults: None  Significant Diagnostic Studies: U/S  Treatments: surgery: Lap chole  Discharge Exam: Blood pressure 138/78, pulse 72, temperature 98.1 F (36.7 C), temperature source Oral, resp. rate 20, height 6' (1.829 m), weight 288 lb 8 oz (130.863 kg), SpO2 94 %. General appearance: alert, cooperative and no distress GI: soft, mild distension, non-tender, incision sites c/d/i Extremities: extremities normal, atraumatic, no cyanosis or edema  Disposition:   Discharge Instructions    Call MD for:  difficulty breathing, headache or visual disturbances    Complete by:  As directed      Call MD for:  persistant nausea and vomiting    Complete by:  As directed      Call MD for:  redness, tenderness, or signs of infection (pain, swelling, redness, odor or green/yellow discharge around incision site)    Complete by:  As directed      Call MD for:  severe uncontrolled pain    Complete by:  As directed      Call MD for:  temperature >100.4    Complete by:  As directed      Diet - low sodium heart healthy    Complete by:  As directed      Increase activity slowly    Complete by:  As directed      Lifting restrictions    Complete by:  As directed   No lifting over 15 lbs for 3 weeks     May shower / Bathe    Complete by:  As directed      May walk up steps    Complete by:  As directed      Walk with assistance    Complete by:  As directed             Medication List     TAKE these medications        albuterol 108 (90 Base) MCG/ACT inhaler  Commonly known as:  PROVENTIL HFA;VENTOLIN HFA  Inhale 1-2 puffs into the lungs every 6 (six) hours as needed for wheezing or shortness of breath.     aspirin 81 MG chewable tablet  Chew 81 mg by mouth daily.     Coenzyme Q10 400 MG Caps  Take 1 capsule by mouth daily.     doxazosin 4 MG tablet  Commonly known as:  CARDURA  Take 1 tablet by mouth 2 (two) times daily.     metoprolol succinate 100 MG 24 hr tablet  Commonly known as:  TOPROL-XL  Take 50 mg by mouth daily.     multivitamin with minerals Tabs tablet  Take 1 tablet by mouth daily.     omega-3 acid ethyl esters 1 g capsule  Commonly known as:  LOVAZA  Take 2 g by mouth daily.     oxyCODONE-acetaminophen 5-325 MG tablet  Commonly known as:  PERCOCET/ROXICET  Take 1-2 tablets by mouth every 4 (four) hours as needed for moderate pain.     potassium chloride SA 20 MEQ tablet  Commonly known as:  K-DUR,KLOR-CON  Take 1 tablet by mouth daily.     psyllium 95 % Pack  Commonly known as:  HYDROCIL/METAMUCIL  Take 2 packets by mouth daily.     testosterone 50 MG/5GM (1%) Gel  Commonly known as:  ANDROGEL  Place 5 g onto the skin daily.     valsartan 320 MG tablet  Commonly known as:  DIOVAN  Take 1 tablet by mouth daily.           Follow-up Information    Follow up with Gladis Riffle, MD On 11/23/2015.   Specialty:  Surgery   Why:  @ 1:30 PM [facility], 2nd floor   Contact information:   [address] [place] [address] [phone]       Signed: Gladis Riffle 11/16/2015, 3:01 PM

## 2015-11-16 NOTE — Anesthesia Postprocedure Evaluation (Signed)
Anesthesia Post Note  Patient: Francoise CeoHoyt D Wallander  Procedure(s) Performed: Procedure(s) (LRB): LAPAROSCOPIC CHOLECYSTECTOMY WITH INTRAOPERATIVE CHOLANGIOGRAM (N/A) HERNIA REPAIR UMBILICAL ADULT  Patient location during evaluation: PACU Anesthesia Type: General Level of consciousness: awake and alert Pain management: pain level controlled Vital Signs Assessment: post-procedure vital signs reviewed and stable Respiratory status: spontaneous breathing and respiratory function stable Cardiovascular status: stable Anesthetic complications: no    Last Vitals:  Filed Vitals:   11/16/15 0024 11/16/15 0101  BP:    Pulse:    Temp: 37 C 37.2 C  Resp:      Last Pain:  Filed Vitals:   11/16/15 0104  PainSc: 3                  KEPHART,WILLIAM K

## 2015-11-16 NOTE — Op Note (Signed)
Preop diagnosis: Acute cholecystitis with cholelithiasis. Small umbilical hernia.  Post op diagnosis: Same  Operation: Laparoscopy cholecystectomy and repair umbilical hernia  Surgeon: S.G.Sherlene Rickel  Assistant:     Anesthesia: Gen.  Complications: None  EBL: 25-50 mL  Drains: None  Description: Patient was put to sleep in the supine position the operating table.. The abdomen was prepped and draped as sterile field. Timeout was. Patient had a fingerbreadth-sized umbilical fascial defect. A transverse incision was made overlying this. The hernia constituted a 1-1/2 2 cm size of preperitoneal fat. The Veress needle was positioned into the peritoneal cavity through this area successfully and pneumoperitoneum obtained. A 10 mm port was then placed and the camera was introduced with subsequent placement of the epigastric and 2 lateral right upper quadrant 5 mm ports and angled scope was utilized to have better visualization of the gallbladder area. Given the patient's size of this angled scope allow for better visualization. Gallbladder was noted to be edematous and distended. A needle trocar was used to decompress the gallbladder containing mostly mucoid material. After this the gallbladder was easy to manipulate and was retracted cephalad. Adhesions surrounding the gallbladder were then taken down with the use of cautery and in the course of all this that couldn't there was noted be moderate amount of from oozing from the gallbladder wall itself consistent with the mildly hemorrhagic cholecystitis. After the adhesions were peeled away with the Hartman's pouch was identified and was then further dissected until the cystic duct was identified and freed. The cystic duct was noted to be very narrow. Since there was no clinical involvement of the common bile duct   attempt was not made to do a cholangiogram at this time. The cystic duct was hemoclipped and cut the cystic artery was then dissected and it was  noted that there were a posterior branch and the vein branch both of which were hemoclipped and cut separately. Following this the gallbladder was dissected free from its bed using cautery for control of bleeding. After this was freed the gallbladder bed was inspected and no unusual bleeding at this time was noted. The clips over the arteries and the cystic duct was noted to be intact. Moderate amount of fluid was used to irrigate out the right upper quadrant and all of this was suctioned out. The gallbladder was placed in a retrieval bag and brought out through the umbilical port site. The gallbladder contained compacted multiple tiny stones. The pneumoperitoneum was released and the ports were removed. The umbilical hernial fascial defect was then closed with 2 figure-of-eight stitches of 0 PDS. The skin incisions were then closed with subcuticular 4-0 Vicryl reinforced with Steri-Strips and tincture benzoin.. Telfa and Tegaderm dressings were placed. Patient subsequently was extubated and then returned to recovery room stable condition.

## 2015-11-16 NOTE — Transfer of Care (Signed)
Immediate Anesthesia Transfer of Care Note  Patient: Walter Anderson  Procedure(s) Performed: Procedure(s): LAPAROSCOPIC CHOLECYSTECTOMY WITH INTRAOPERATIVE CHOLANGIOGRAM (N/A) HERNIA REPAIR UMBILICAL ADULT  Patient Location: PACU  Anesthesia Type:General  Level of Consciousness: awake, alert , oriented and patient cooperative  Airway & Oxygen Therapy: Patient Spontanous Breathing and Patient connected to face mask oxygen  Post-op Assessment: Report given to RN and Post -op Vital signs reviewed and stable  Post vital signs: Reviewed and stable  Last Vitals:  Filed Vitals:   11/15/15 1858 11/15/15 2030  BP: 195/104 179/86  Pulse: 74   Temp:    Resp: 20 20    Complications: No apparent anesthesia complications

## 2015-11-16 NOTE — Progress Notes (Signed)
Pt d/c home; d/c instructions reviewed w/ pt; pt understanding was verbalized; IV's removed, catheters in tact, gauze dressings applied; incision dressings CDI at d/c;  all pt questions answered; pt left unit via wheelchair accompanied by staff

## 2015-11-16 NOTE — Discharge Instructions (Signed)
Follow all MD discharge instructions. Take all medications as prescribed. Keep all follow up appointments. If your symptoms return, call your doctor. If you experience any new symptoms that are of concern to you or that are bothersome to you, call your doctor. For all questions and/or concerns, call your doctor. ° °If you experience any bleeding or discharge from your incision sites, redness or swelling at your incision sites, fever, pain uncontrolled by medication, increase in pain, or any nausea, call your doctor.  ° ° If you have a medical emergency, call 911 °

## 2015-11-16 NOTE — Progress Notes (Signed)
PHARMACIST - PHYSICIAN ORDER COMMUNICATION  CONCERNING: P&T Medication Policy on Herbal Medications  DESCRIPTION:  This patient's order for:  Coenzyme Q-10  has been noted.  This product(s) is classified as an "herbal" or natural product. Due to a lack of definitive safety studies or FDA approval, nonstandard manufacturing practices, plus the potential risk of unknown drug-drug interactions while on inpatient medications, the Pharmacy and Therapeutics Committee does not permit the use of "herbal" or natural products of this type within [facility].   ACTION TAKEN: The pharmacy department is unable to verify this order at this time. Please reevaluate patient's clinical condition at discharge and address if the herbal or natural product(s) should be resumed at that time.   

## 2015-11-17 ENCOUNTER — Telehealth: Payer: Self-pay

## 2015-11-17 LAB — SURGICAL PATHOLOGY

## 2015-11-17 NOTE — Telephone Encounter (Signed)
Post discharge call to patient made at this time. Spoke with patient's wife as patient is in the restroom currently. Pain is controlled. Only discomfort patient is having is Bilateral shoulder pain. I explained that with the gas used during surgery, this is normal. He needs to increase is walking around as much as possible to promote this gas pain being relieved. She verbalizes understanding of this.  Denies nausea, vomiting, any difficulties with bowels, and fever at this time. No other questions or concerns.   Confirmed patient appointment scheduled. Encouraged patient to call with any questions that arise prior to appointment.

## 2015-11-23 ENCOUNTER — Ambulatory Visit (INDEPENDENT_AMBULATORY_CARE_PROVIDER_SITE_OTHER): Payer: Medicare Other | Admitting: Surgery

## 2015-11-23 ENCOUNTER — Encounter: Payer: Self-pay | Admitting: Surgery

## 2015-11-23 VITALS — BP 158/81 | HR 78 | Temp 98.5°F | Ht 72.0 in | Wt 281.4 lb

## 2015-11-23 DIAGNOSIS — K8000 Calculus of gallbladder with acute cholecystitis without obstruction: Secondary | ICD-10-CM

## 2015-11-23 NOTE — Progress Notes (Signed)
70 yr old s/p Lap chole for acute cholecystitis.  Patient doing well today, states he feels better now than he has in at least a year.  He states appetite is good, not taking any pain medications and has been back to normal.   Filed Vitals:   11/23/15 1326  BP: 158/81  Pulse: 78  Temp: 98.5 F (36.9 C)    PE:  Gen: NAD Abd: soft, nt, nd, incision sites, superior ones clean and dry, umbilical site with slight amount of erythema after steristrips removed but no drainage  A/P:  Doing well post op.  Instructed not to lift anything over 15 lbs for 3 more weeks.  Discussed with patient and wife that if redness spreads or the wound begins draining to call so he can start an antibiotic. Otherwise can f/u prn.

## 2015-11-23 NOTE — Patient Instructions (Signed)
If this area around your belly button becomes more red, call the office and we will place you on an antibiotic.  Please do not submerge in a tub, hot tub, or pool until incisions are completely sealed.  Use sun block to incision area over the next year if this area will be exposed to sun. This helps decrease scarring.  You may now resume your normal activities. Listen to your body when lifting, if you have pain when lifting, stop and then try again in a few days.  If you develop redness, drainage, or pain at incision sites- call our office immediately and speak with a nurse.  Please call with ANY questions or concerns.

## 2016-12-12 IMAGING — CR DG CHEST 2V
1 series · 2 of 2 positions shown · non-contrast
Comparison: Prior chest x-ray 06/06/2005

CLINICAL DATA: 69-year-old female with pain radiating across the
diaphragm and pain in the right upper quadrant and right breast as
well as some shortness of breath.

EXAM:
CHEST  2 VIEW

[Series 1: dg chest 2 view · 0.14mm/px · 2 of 2 slices shown]
[im 1/2]
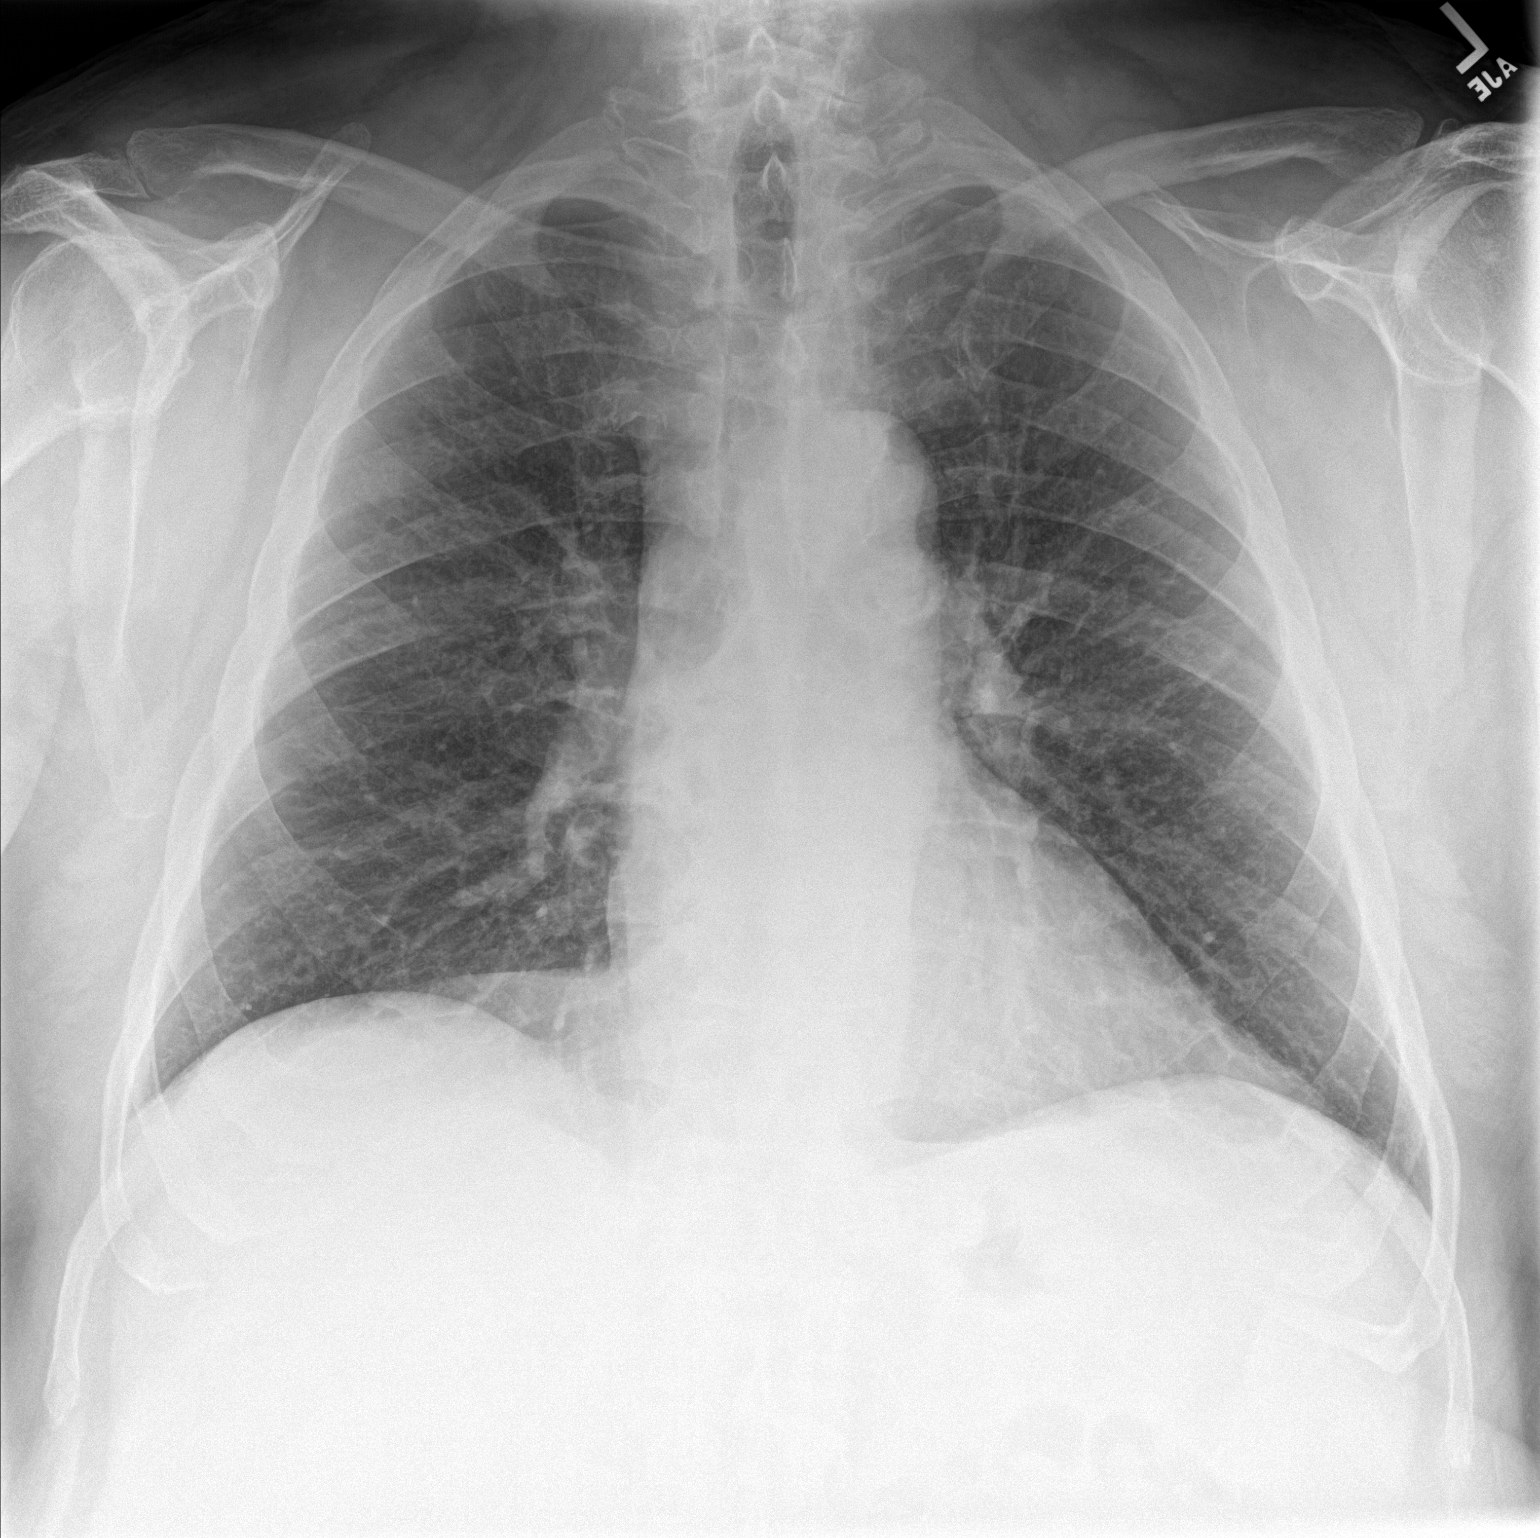
[im 2/2]
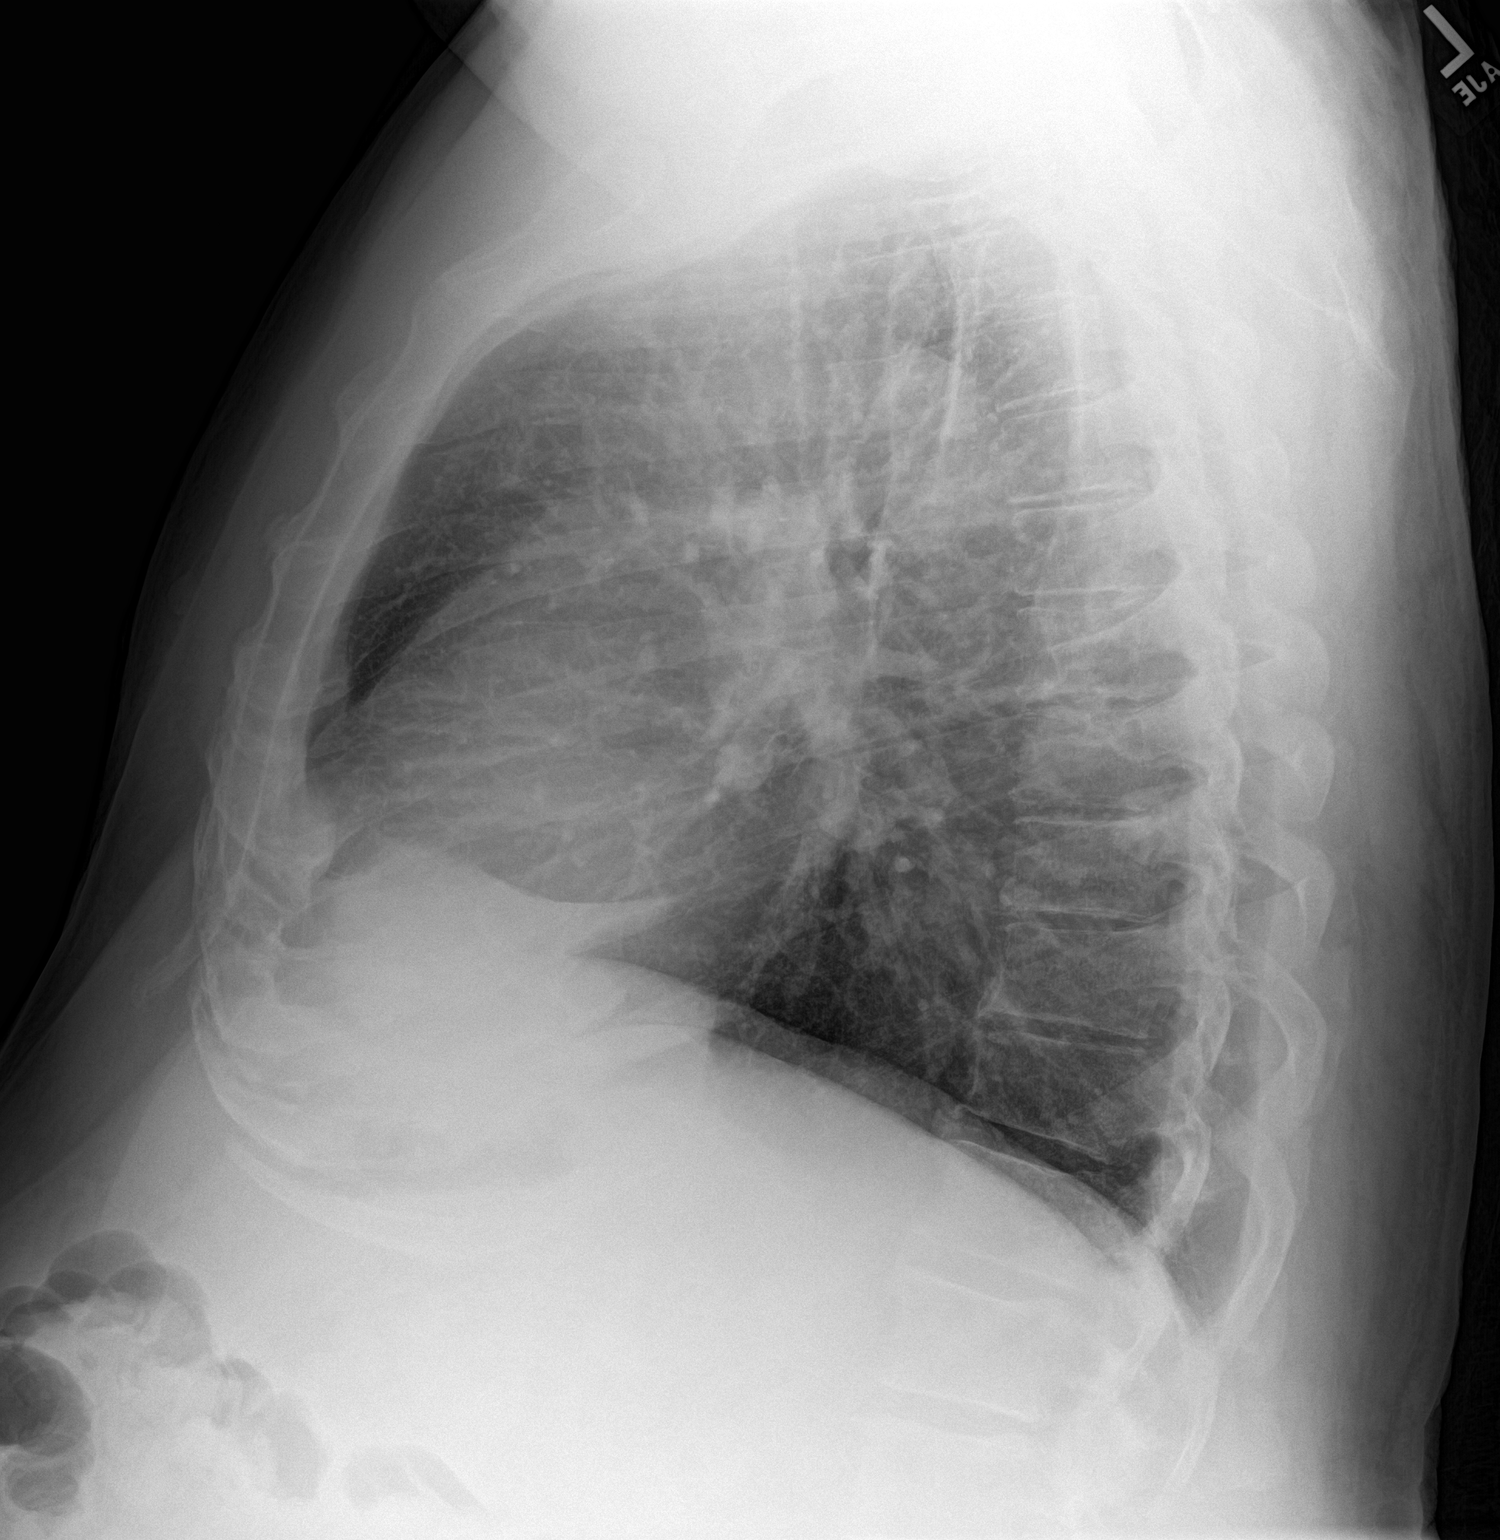

[2 of 2 positions shown; findings below may reference images not displayed]

FINDINGS: Cardiac and mediastinal contours remain within normal limits. Trace
atherosclerosis in the transverse aorta. Stable chronic bronchitic
change in interstitial prominence. No focal airspace consolidation,
pulmonary edema, pleural effusion or pneumothorax. No suspicious
mass or nodule. No acute osseous abnormality.
IMPRESSION: No active cardiopulmonary disease.

## 2018-02-18 ENCOUNTER — Encounter: Payer: Self-pay | Admitting: Physician Assistant

## 2018-03-10 ENCOUNTER — Encounter (HOSPITAL_COMMUNITY): Payer: Self-pay | Admitting: *Deleted

## 2018-03-21 NOTE — Patient Instructions (Addendum)
Walter Anderson  03/21/2018   Your procedure is scheduled on: Tuesday 04/01/2018  Report to [facility] Main  Entrance             Report to admitting at  1150  AM    Call this number if you have problems the morning of surgery [phone]              Please bring your CPAP mask and tubing with you to the hospital!   Remember: Do not eat food  :After Midnight. You may have clear liquids from midnight up until 0820 am then nothing until after surgery!     CLEAR LIQUID DIET   Foods Allowed                                                                     Foods Excluded  Coffee and tea, regular and decaf                             liquids that you cannot  Plain Jell-O in any flavor                                             see through such as: Fruit ices (not with fruit pulp)                                     milk, soups, orange juice  Iced Popsicles                                    All solid food Carbonated beverages, regular and diet                                    Cranberry, grape and apple juices Sports drinks like Gatorade Lightly seasoned clear broth or consume(fat free) Sugar, honey syrup  Sample Menu Breakfast                                Lunch                                     Supper Cranberry juice                    Beef broth                            Chicken broth Jell-O  Grape juice                           Apple juice Coffee or tea                        Jell-O                                      Popsicle                                                Coffee or tea                        Coffee or tea  _____________________________________________________________________     Take these medicines the morning of surgery with A SIP OF WATER: Metoprolol succinate (Toprol-XL), use Albuterol inhaler if needed and bring inhaler with you to the hospital.                                 You  may not have any metal on your body including hair pins and              piercings  Do not wear jewelry, make-up, lotions, powders or perfumes, deodorant             Men may shave face and neck.   Do not bring valuables to the hospital. [facility] IS NOT             RESPONSIBLE   FOR VALUABLES.  Contacts, dentures or bridgework may not be worn into surgery.  Leave suitcase in the car. After surgery it may be brought to your room.                  Please read over the following fact sheets you were given: _____________________________________________________________________             [facility] - Preparing for Surgery Before surgery, you can play an important role.  Because skin is not sterile, your skin needs to be as free of germs as possible.  You can reduce the number of germs on your skin by washing with CHG (chlorahexidine gluconate) soap before surgery.  CHG is an antiseptic cleaner which kills germs and bonds with the skin to continue killing germs even after washing. Please DO NOT use if you have an allergy to CHG or antibacterial soaps.  If your skin becomes reddened/irritated stop using the CHG and inform your nurse when you arrive at Short Stay. Do not shave (including legs and underarms) for at least 48 hours prior to the first CHG shower.  You may shave your face/neck. Please follow these instructions carefully:  1.  Shower with CHG Soap the night before surgery and the  morning of Surgery.  2.  If you choose to wash your hair, wash your hair first as usual with your  normal  shampoo.  3.  After you shampoo, rinse your hair and body thoroughly to remove the  shampoo.  4.  Use CHG as you would any other liquid soap.  You can apply chg directly  to the skin and wash                       Gently with a scrungie or clean washcloth.  5.  Apply the CHG Soap to your body ONLY FROM THE NECK DOWN.   Do not use on face/ open                           Wound  or open sores. Avoid contact with eyes, ears mouth and genitals (private parts).                       Wash face,  Genitals (private parts) with your normal soap.             6.  Wash thoroughly, paying special attention to the area where your surgery  will be performed.  7.  Thoroughly rinse your body with warm water from the neck down.  8.  DO NOT shower/wash with your normal soap after using and rinsing off  the CHG Soap.                9.  Pat yourself dry with a clean towel.            10.  Wear clean pajamas.            11.  Place clean sheets on your bed the night of your first shower and do not  sleep with pets. Day of Surgery : Do not apply any lotions/deodorants the morning of surgery.  Please wear clean clothes to the hospital/surgery center.  FAILURE TO FOLLOW THESE INSTRUCTIONS MAY RESULT IN THE CANCELLATION OF YOUR SURGERY PATIENT SIGNATURE_________________________________  NURSE SIGNATURE__________________________________  ________________________________________________________________________   Adam Phenix  An incentive spirometer is a tool that can help keep your lungs clear and active. This tool measures how well you are filling your lungs with each breath. Taking long deep breaths may help reverse or decrease the chance of developing breathing (pulmonary) problems (especially infection) following:  A long period of time when you are unable to move or be active. BEFORE THE PROCEDURE   If the spirometer includes an indicator to show your best effort, your nurse or respiratory therapist will set it to a desired goal.  If possible, sit up straight or lean slightly forward. Try not to slouch.  Hold the incentive spirometer in an upright position. INSTRUCTIONS FOR USE  1. Sit on the edge of your bed if possible, or sit up as far as you can in bed or on a chair. 2. Hold the incentive spirometer in an upright position. 3. Breathe out normally. 4. Place the  mouthpiece in your mouth and seal your lips tightly around it. 5. Breathe in slowly and as deeply as possible, raising the piston or the ball toward the top of the column. 6. Hold your breath for 3-5 seconds or for as long as possible. Allow the piston or ball to fall to the bottom of the column. 7. Remove the mouthpiece from your mouth and breathe out normally. 8. Rest for a few seconds and repeat Steps 1 through 7 at least 10 times every 1-2 hours when you are awake. Take your time and take a few normal breaths between deep breaths. 9. The spirometer may include an indicator to  show your best effort. Use the indicator as a goal to work toward during each repetition. 10. After each set of 10 deep breaths, practice coughing to be sure your lungs are clear. If you have an incision (the cut made at the time of surgery), support your incision when coughing by placing a pillow or rolled up towels firmly against it. Once you are able to get out of bed, walk around indoors and cough well. You may stop using the incentive spirometer when instructed by your caregiver.  RISKS AND COMPLICATIONS  Take your time so you do not get dizzy or light-headed.  If you are in pain, you may need to take or ask for pain medication before doing incentive spirometry. It is harder to take a deep breath if you are having pain. AFTER USE  Rest and breathe slowly and easily.  It can be helpful to keep track of a log of your progress. Your caregiver can provide you with a simple table to help with this. If you are using the spirometer at home, follow these instructions: [place] IF:   You are having difficultly using the spirometer.  You have trouble using the spirometer as often as instructed.  Your pain medication is not giving enough relief while using the spirometer.  You develop fever of 100.5 F (38.1 C) or higher. SEEK IMMEDIATE MEDICAL CARE IF:   You cough up bloody sputum that had not been present  before.  You develop fever of 102 F (38.9 C) or greater.  You develop worsening pain at or near the incision site. MAKE SURE YOU:   Understand these instructions.  Will watch your condition.  Will get help right away if you are not doing well or get worse. Document Released: 03/18/2007 Document Revised: 01/28/2012 Document Reviewed: 05/19/2007 Exit[employer] Patient Information 2014 [employer], [place].   ________________________________________________________________________  WHAT IS A BLOOD TRANSFUSION? Blood Transfusion Information  A transfusion is the replacement of blood or some of its parts. Blood is made up of multiple cells which provide different functions.  Red blood cells carry oxygen and are used for blood loss replacement.  White blood cells fight against infection.  Platelets control bleeding.  Plasma helps clot blood.  Other blood products are available for specialized needs, such as hemophilia or other clotting disorders. BEFORE THE TRANSFUSION  Who gives blood for transfusions?   Healthy volunteers who are fully evaluated to make sure their blood is safe. This is blood bank blood. Transfusion therapy is the safest it has ever been in the practice of medicine. Before blood is taken from a donor, a complete history is taken to make sure that person has no history of diseases nor engages in risky social behavior (examples are intravenous drug use or sexual activity with multiple partners). The donor's travel history is screened to minimize risk of transmitting infections, such as malaria. The donated blood is tested for signs of infectious diseases, such as HIV and hepatitis. The blood is then tested to be sure it is compatible with you in order to minimize the chance of a transfusion reaction. If you or a relative donates blood, this is often done in anticipation of surgery and is not appropriate for emergency situations. It takes many days to process the donated  blood. RISKS AND COMPLICATIONS Although transfusion therapy is very safe and saves many lives, the main dangers of transfusion include:   Getting an infectious disease.  Developing a transfusion reaction. This is an allergic reaction  to something in the blood you were given. Every precaution is taken to prevent this. The decision to have a blood transfusion has been considered carefully by your caregiver before blood is given. Blood is not given unless the benefits outweigh the risks. AFTER THE TRANSFUSION  Right after receiving a blood transfusion, you will usually feel much better and more energetic. This is especially true if your red blood cells have gotten low (anemic). The transfusion raises the level of the red blood cells which carry oxygen, and this usually causes an energy increase.  The nurse administering the transfusion will monitor you carefully for complications. HOME CARE INSTRUCTIONS  No special instructions are needed after a transfusion. You may find your energy is better. Speak with your caregiver about any limitations on activity for underlying diseases you may have. SEEK MEDICAL CARE IF:   Your condition is not improving after your transfusion.  You develop redness or irritation at the intravenous (IV) site. SEEK IMMEDIATE MEDICAL CARE IF:  Any of the following symptoms occur over the next 12 hours:  Shaking chills.  You have a temperature by mouth above 102 F (38.9 C), not controlled by medicine.  Chest, back, or muscle pain.  People around you feel you are not acting correctly or are confused.  Shortness of breath or difficulty breathing.  Dizziness and fainting.  You get a rash or develop hives.  You have a decrease in urine output.  Your urine turns a dark color or changes to pink, red, or brown. Any of the following symptoms occur over the next 10 days:  You have a temperature by mouth above 102 F (38.9 C), not controlled by  medicine.  Shortness of breath.  Weakness after normal activity.  The white part of the eye turns yellow (jaundice).  You have a decrease in the amount of urine or are urinating less often.  Your urine turns a dark color or changes to pink, red, or brown. Document Released: 11/02/2000 Document Revised: 01/28/2012 Document Reviewed: 06/21/2008 [facility] Patient Information 2014 [place], [place].  _______________________________________________________________________

## 2018-03-24 ENCOUNTER — Encounter (HOSPITAL_COMMUNITY): Payer: Self-pay

## 2018-03-24 ENCOUNTER — Other Ambulatory Visit: Payer: Self-pay

## 2018-03-24 ENCOUNTER — Encounter (HOSPITAL_COMMUNITY)
Admission: RE | Admit: 2018-03-24 | Discharge: 2018-03-24 | Disposition: A | Discharge status: Home / Self Care | Payer: Medicare Other | Source: Ambulatory Visit | Attending: Orthopedic Surgery | Admitting: Orthopedic Surgery

## 2018-03-24 DIAGNOSIS — I10 Essential (primary) hypertension: Secondary | ICD-10-CM | POA: Diagnosis not present

## 2018-03-24 DIAGNOSIS — R9431 Abnormal electrocardiogram [ECG] [EKG]: Secondary | ICD-10-CM | POA: Diagnosis not present

## 2018-03-24 DIAGNOSIS — Z01812 Encounter for preprocedural laboratory examination: Secondary | ICD-10-CM | POA: Diagnosis not present

## 2018-03-24 DIAGNOSIS — Z01810 Encounter for preprocedural cardiovascular examination: Secondary | ICD-10-CM | POA: Diagnosis not present

## 2018-03-24 DIAGNOSIS — M25551 Pain in right hip: Secondary | ICD-10-CM | POA: Diagnosis not present

## 2018-03-24 DIAGNOSIS — M1611 Unilateral primary osteoarthritis, right hip: Secondary | ICD-10-CM | POA: Insufficient documentation

## 2018-03-24 HISTORY — DX: Unspecified osteoarthritis, unspecified site: M19.90

## 2018-03-24 LAB — CBC
HCT: 48.8 % (ref 39.0–52.0)
Hemoglobin: 16.4 g/dL (ref 13.0–17.0)
MCH: 31.5 pg (ref 26.0–34.0)
MCHC: 33.6 g/dL (ref 30.0–36.0)
MCV: 93.7 fL (ref 78.0–100.0)
Platelets: 122 10*3/uL — ABNORMAL LOW (ref 150–400)
RBC: 5.21 MIL/uL (ref 4.22–5.81)
RDW: 14.2 % (ref 11.5–15.5)
WBC: 7.6 10*3/uL (ref 4.0–10.5)

## 2018-03-24 LAB — BASIC METABOLIC PANEL
Anion gap: 9 (ref 5–15)
BUN: 13 mg/dL (ref 6–20)
CALCIUM: 8.8 mg/dL — AB (ref 8.9–10.3)
CO2: 24 mmol/L (ref 22–32)
Chloride: 103 mmol/L (ref 101–111)
Creatinine, Ser: 1.08 mg/dL (ref 0.61–1.24)
GLUCOSE: 130 mg/dL — AB (ref 65–99)
POTASSIUM: 4.5 mmol/L (ref 3.5–5.1)
SODIUM: 136 mmol/L (ref 135–145)

## 2018-03-24 LAB — SURGICAL PCR SCREEN
MRSA, PCR: NEGATIVE
STAPHYLOCOCCUS AUREUS: NEGATIVE

## 2018-03-24 LAB — ABO/RH: ABO/RH(D): O POS

## 2018-03-31 MED ORDER — TRANEXAMIC ACID 1000 MG/10ML IV SOLN
1000.0000 mg | INTRAVENOUS | Status: AC
  Administered 2018-04-01: 1000 mg via INTRAVENOUS
  Filled 2018-03-31: qty 1100

## 2018-03-31 MED ORDER — DEXTROSE 5 % IV SOLN
3.0000 g | INTRAVENOUS | Status: AC
  Administered 2018-04-01: 3 g via INTRAVENOUS
  Filled 2018-03-31: qty 3

## 2018-03-31 NOTE — H&P (Signed)
TOTAL HIP ADMISSION H&P  Patient is admitted for right total hip arthroplasty, anterior approach.  Subjective:  Chief Complaint:    Right hip primary OA / pain  HPI: Walter Anderson, 72 y.o. male, has a history of pain and functional disability in the right hip(s) due to arthritis and patient has failed non-surgical conservative treatments for greater than 12 weeks to include NSAID's and/or analgesics and activity modification.  Onset of symptoms was gradual starting ~6 years ago with gradually worsening course since that time.The patient noted no past surgery on the right hip(s).  Patient currently rates pain in the right hip at 8 out of 10 with activity. Patient has night pain, worsening of pain with activity and weight bearing, trendelenberg gait, pain that interfers with activities of daily living and pain with passive range of motion. Patient has evidence of periarticular osteophytes and joint space narrowing by imaging studies. This condition presents safety issues increasing the risk of falls.   There is no current active infection.  Risks, benefits and expectations were discussed with the patient.  Risks including but not limited to the risk of anesthesia, blood clots, nerve damage, blood vessel damage, failure of the prosthesis, infection and up to and including death.  Patient understand the risks, benefits and expectations and wishes to proceed with surgery.   PCP: Lonie Peak, PA-C  D/C Plans:       Home  Post-op Meds:       No Rx given  Tranexamic Acid:      To be given - IV   Decadron:      Is to be given  FYI:      ASA  Norco  CPAP  DME:    Pt already has equipment  PT:    No PT   Patient Active Problem List   Diagnosis Date Noted  . Cholecystitis, acute with cholelithiasis 11/16/2015   Past Medical History:  Diagnosis Date  . Allergy   . Arthritis   . Hypertension   . Sleep apnea    uses CPAP    Past Surgical History:  Procedure Laterality Date  . BACK  SURGERY  2006  . CHOLECYSTECTOMY N/A 11/15/2015   Procedure: LAPAROSCOPIC CHOLECYSTECTOMY WITH INTRAOPERATIVE CHOLANGIOGRAM;  Surgeon: Kieth Brightly, MD;  Location: [facility] ORS;  Service: General;  Laterality: N/A;  . FINGER ARTHROSCOPY  1990   3rd Finger Left Hand  . ROTATOR CUFF REPAIR     right shoulder  . TONSILLECTOMY  72 years old  . UMBILICAL HERNIA REPAIR  11/15/2015   Procedure: HERNIA REPAIR UMBILICAL ADULT;  Surgeon: Kieth Brightly, MD;  Location: [facility] ORS;  Service: General;;    No current facility-administered medications for this encounter.    Current Outpatient Medications  Medication Sig Dispense Refill Last Dose  . albuterol (PROVENTIL HFA;VENTOLIN HFA) 108 (90 Base) MCG/ACT inhaler Inhale 1-2 puffs into the lungs every 6 (six) hours as needed for wheezing or shortness of breath.   Taking  . aspirin 81 MG chewable tablet Chew 81 mg by mouth daily.   Taking  . Bromelains (BROMELAIN PO) Take 240 mg by mouth daily.     . Coenzyme Q10 400 MG CAPS Take 1 capsule by mouth daily.   Taking  . doxazosin (CARDURA) 4 MG tablet Take 1 tablet by mouth 2 (two) times daily.   Taking  . magnesium gluconate (MAGONATE) 500 MG tablet Take 500 mg by mouth 2 (two) times daily.     Marland Kitchen  metoprolol succinate (TOPROL-XL) 100 MG 24 hr tablet Take 100 mg by mouth daily.    Taking  . Multiple Vitamin (MULTIVITAMIN WITH MINERALS) TABS tablet Take 1 tablet by mouth daily.   Taking  . niacin 500 MG tablet Take 500 mg by mouth at bedtime.     Marland Kitchen omega-3 acid ethyl esters (LOVAZA) 1 g capsule Take 2 g by mouth daily.   Taking  . potassium chloride SA (K-DUR,KLOR-CON) 20 MEQ tablet Take 1 tablet by mouth daily.   Taking  . psyllium (HYDROCIL/METAMUCIL) 95 % PACK Take 2 packets by mouth daily.   Taking  . testosterone (ANDROGEL) 50 MG/5GM (1%) GEL Place 5 g onto the skin daily.   Taking  . TURMERIC PO Take 750 mg by mouth daily.     . valsartan (DIOVAN) 320 MG tablet Take 1 tablet by mouth daily.    Taking   No Known Allergies   Social History   Tobacco Use  . Smoking status: Never Smoker  . Smokeless tobacco: Former Neurosurgeon    Types: Chew  Substance Use Topics  . Alcohol use: Yes    Comment: 1-2 Weekly    Family History  Problem Relation Age of Onset  . Diabetes Mother   . COPD Father   . Heart disease Father      Review of Systems  Constitutional: Negative.   HENT: Negative.   Eyes: Negative.   Respiratory: Negative.   Cardiovascular: Negative.   Gastrointestinal: Negative.   Genitourinary: Negative.   Musculoskeletal: Positive for joint pain.  Skin: Negative.   Neurological: Negative.   Endo/Heme/Allergies: Positive for environmental allergies.  Psychiatric/Behavioral: Negative.     Objective:  Physical Exam  Constitutional: He is oriented to person, place, and time. He appears well-developed.  HENT:  Head: Normocephalic.  Eyes: Pupils are equal, round, and reactive to light.  Neck: Neck supple. No JVD present. No tracheal deviation present. No thyromegaly present.  Cardiovascular: Normal rate, regular rhythm and intact distal pulses.  Respiratory: Effort normal and breath sounds normal. No respiratory distress. He has no wheezes.  GI: Soft. There is no tenderness. There is no guarding.  Musculoskeletal:       Right hip: He exhibits decreased range of motion, decreased strength, tenderness and bony tenderness. He exhibits no swelling, no deformity and no laceration.  Lymphadenopathy:    He has no cervical adenopathy.  Neurological: He is alert and oriented to person, place, and time. A sensory deficit (neuropathy bilateral LEs) is present.  Skin: Skin is warm and dry.  Psychiatric: He has a normal mood and affect.      Labs:  Estimated body mass index is 39.36 kg/m as calculated from the following:   Height as of 03/24/18: 6' (1.829 m).   Weight as of 03/24/18: 131.6 kg (290 lb 3.2 oz).   Imaging Review Plain radiographs demonstrate severe  degenerative joint disease of the right hip(s). The bone quality appears to be good for age and reported activity level.    Preoperative templating of the joint replacement has been completed, documented, and submitted to the Operating Room personnel in order to optimize intra-operative equipment management.       Assessment/Plan:  End stage arthritis, right hip  The patient history, physical examination, clinical judgement of the provider and imaging studies are consistent with end stage degenerative joint disease of the right hip and total hip arthroplasty is deemed medically necessary. The treatment options including medical management, injection therapy, arthroscopy and arthroplasty  were discussed at length. The risks and benefits of total hip arthroplasty were presented and reviewed. The risks due to aseptic loosening, infection, stiffness, dislocation/subluxation,  thromboembolic complications and other imponderables were discussed.  The patient acknowledged the explanation, agreed to proceed with the plan and consent was signed. Patient is being admitted for inpatient treatment for surgery, pain control, PT, OT, prophylactic antibiotics, VTE prophylaxis, progressive ambulation and ADL's and discharge planning.The patient is planning to be discharged home.     Anastasio Auerbach Judithe Keetch   PA-C  03/31/2018, 9:06 AM

## 2018-04-01 ENCOUNTER — Other Ambulatory Visit: Payer: Self-pay

## 2018-04-01 ENCOUNTER — Inpatient Hospital Stay (HOSPITAL_COMMUNITY): Payer: Medicare Other

## 2018-04-01 ENCOUNTER — Encounter (HOSPITAL_COMMUNITY)
Admission: RE | Disposition: A | Discharge status: Home / Self Care | Payer: Self-pay | Source: Ambulatory Visit | Attending: Orthopedic Surgery

## 2018-04-01 ENCOUNTER — Inpatient Hospital Stay (HOSPITAL_COMMUNITY): Payer: Medicare Other | Admitting: Certified Registered Nurse Anesthetist

## 2018-04-01 ENCOUNTER — Encounter (HOSPITAL_COMMUNITY): Payer: Self-pay | Admitting: *Deleted

## 2018-04-01 ENCOUNTER — Inpatient Hospital Stay (HOSPITAL_COMMUNITY)
Admission: RE | Admit: 2018-04-01 | Discharge: 2018-04-02 | DRG: 470 | Disposition: A | Discharge status: Home / Self Care | Payer: Medicare Other | Source: Ambulatory Visit | Attending: Orthopedic Surgery | Admitting: Orthopedic Surgery

## 2018-04-01 DIAGNOSIS — Z9049 Acquired absence of other specified parts of digestive tract: Secondary | ICD-10-CM | POA: Diagnosis not present

## 2018-04-01 DIAGNOSIS — Z96641 Presence of right artificial hip joint: Secondary | ICD-10-CM

## 2018-04-01 DIAGNOSIS — Z87891 Personal history of nicotine dependence: Secondary | ICD-10-CM

## 2018-04-01 DIAGNOSIS — M1611 Unilateral primary osteoarthritis, right hip: Secondary | ICD-10-CM | POA: Diagnosis present

## 2018-04-01 DIAGNOSIS — I10 Essential (primary) hypertension: Secondary | ICD-10-CM | POA: Diagnosis present

## 2018-04-01 DIAGNOSIS — Z9181 History of falling: Secondary | ICD-10-CM

## 2018-04-01 DIAGNOSIS — Z8249 Family history of ischemic heart disease and other diseases of the circulatory system: Secondary | ICD-10-CM

## 2018-04-01 DIAGNOSIS — Z6837 Body mass index (BMI) 37.0-37.9, adult: Secondary | ICD-10-CM | POA: Diagnosis not present

## 2018-04-01 DIAGNOSIS — Z96649 Presence of unspecified artificial hip joint: Secondary | ICD-10-CM

## 2018-04-01 DIAGNOSIS — Z7982 Long term (current) use of aspirin: Secondary | ICD-10-CM | POA: Diagnosis not present

## 2018-04-01 HISTORY — PX: TOTAL HIP ARTHROPLASTY: SHX124

## 2018-04-01 LAB — TYPE AND SCREEN
ABO/RH(D): O POS
Antibody Screen: NEGATIVE

## 2018-04-01 SURGERY — ARTHROPLASTY, HIP, TOTAL, ANTERIOR APPROACH
Anesthesia: Monitor Anesthesia Care | Site: Hip | Laterality: Right

## 2018-04-01 MED ORDER — MIDAZOLAM HCL 5 MG/5ML IJ SOLN
INTRAMUSCULAR | Status: DC | PRN
  Administered 2018-04-01: 2 mg via INTRAVENOUS

## 2018-04-01 MED ORDER — HYDROCODONE-ACETAMINOPHEN 7.5-325 MG PO TABS: 1.0000 | ORAL_TABLET | ORAL | 0 refills | Status: DC | PRN

## 2018-04-01 MED ORDER — CELECOXIB 200 MG PO CAPS
200.0000 mg | ORAL_CAPSULE | Freq: Two times a day (BID) | ORAL | Status: DC
  Administered 2018-04-01 – 2018-04-02 (×3): 200 mg via ORAL
  Filled 2018-04-01 (×3): qty 1

## 2018-04-01 MED ORDER — HYDROCODONE-ACETAMINOPHEN 5-325 MG PO TABS: 1.0000 | ORAL_TABLET | ORAL | Status: DC | PRN

## 2018-04-01 MED ORDER — ALBUTEROL SULFATE (2.5 MG/3ML) 0.083% IN NEBU: 3.0000 mL | INHALATION_SOLUTION | Freq: Four times a day (QID) | RESPIRATORY_TRACT | Status: DC | PRN

## 2018-04-01 MED ORDER — PHENOL 1.4 % MT LIQD
1.0000 | OROMUCOSAL | Status: DC | PRN
  Filled 2018-04-01: qty 177

## 2018-04-01 MED ORDER — NIACIN 500 MG PO TABS
500.0000 mg | ORAL_TABLET | Freq: Every day | ORAL | Status: DC
  Administered 2018-04-01: 500 mg via ORAL
  Filled 2018-04-01: qty 1

## 2018-04-01 MED ORDER — PROPOFOL 500 MG/50ML IV EMUL
INTRAVENOUS | Status: DC | PRN
  Administered 2018-04-01: 75 ug/kg/min via INTRAVENOUS
  Administered 2018-04-01: 50 ug/kg/min via INTRAVENOUS

## 2018-04-01 MED ORDER — DOXAZOSIN MESYLATE 4 MG PO TABS
4.0000 mg | ORAL_TABLET | Freq: Two times a day (BID) | ORAL | Status: DC
  Administered 2018-04-01 – 2018-04-02 (×2): 4 mg via ORAL
  Filled 2018-04-01 (×2): qty 1

## 2018-04-01 MED ORDER — METOCLOPRAMIDE HCL 5 MG/ML IJ SOLN: 5.0000 mg | Freq: Three times a day (TID) | INTRAMUSCULAR | Status: DC | PRN

## 2018-04-01 MED ORDER — TRANEXAMIC ACID 1000 MG/10ML IV SOLN
1000.0000 mg | Freq: Once | INTRAVENOUS | Status: AC
  Administered 2018-04-01: 1000 mg via INTRAVENOUS
  Filled 2018-04-01: qty 1100

## 2018-04-01 MED ORDER — PROPOFOL 10 MG/ML IV BOLUS
INTRAVENOUS | Status: AC
  Filled 2018-04-01: qty 60

## 2018-04-01 MED ORDER — IRBESARTAN 300 MG PO TABS
300.0000 mg | ORAL_TABLET | Freq: Every day | ORAL | Status: DC
  Administered 2018-04-01 – 2018-04-02 (×2): 300 mg via ORAL
  Filled 2018-04-01: qty 1
  Filled 2018-04-01 (×2): qty 2
  Filled 2018-04-01: qty 1

## 2018-04-01 MED ORDER — FERROUS SULFATE 325 (65 FE) MG PO TABS: 325.0000 mg | ORAL_TABLET | Freq: Three times a day (TID) | ORAL | 3 refills | Status: DC

## 2018-04-01 MED ORDER — BISACODYL 10 MG RE SUPP: 10.0000 mg | Freq: Every day | RECTAL | Status: DC | PRN

## 2018-04-01 MED ORDER — MENTHOL 3 MG MT LOZG: 1.0000 | LOZENGE | OROMUCOSAL | Status: DC | PRN

## 2018-04-01 MED ORDER — DEXAMETHASONE SODIUM PHOSPHATE 10 MG/ML IJ SOLN
INTRAMUSCULAR | Status: AC
  Filled 2018-04-01: qty 1

## 2018-04-01 MED ORDER — ONDANSETRON HCL 4 MG PO TABS: 4.0000 mg | ORAL_TABLET | Freq: Four times a day (QID) | ORAL | Status: DC | PRN

## 2018-04-01 MED ORDER — MAGNESIUM GLUCONATE 500 MG PO TABS
500.0000 mg | ORAL_TABLET | Freq: Two times a day (BID) | ORAL | Status: DC
  Administered 2018-04-01 – 2018-04-02 (×3): 500 mg via ORAL
  Filled 2018-04-01 (×3): qty 1

## 2018-04-01 MED ORDER — ALUM & MAG HYDROXIDE-SIMETH 200-200-20 MG/5ML PO SUSP: 15.0000 mL | ORAL | Status: DC | PRN

## 2018-04-01 MED ORDER — DEXAMETHASONE SODIUM PHOSPHATE 10 MG/ML IJ SOLN
10.0000 mg | Freq: Once | INTRAMUSCULAR | Status: AC
  Administered 2018-04-02: 10 mg via INTRAVENOUS
  Filled 2018-04-01: qty 1

## 2018-04-01 MED ORDER — FENTANYL CITRATE (PF) 100 MCG/2ML IJ SOLN
INTRAMUSCULAR | Status: DC | PRN
  Administered 2018-04-01: 50 ug via INTRAVENOUS

## 2018-04-01 MED ORDER — DIPHENHYDRAMINE HCL 12.5 MG/5ML PO ELIX: 12.5000 mg | ORAL_SOLUTION | ORAL | Status: DC | PRN

## 2018-04-01 MED ORDER — CHLORHEXIDINE GLUCONATE 4 % EX LIQD: 60.0000 mL | Freq: Once | CUTANEOUS | Status: DC

## 2018-04-01 MED ORDER — DOCUSATE SODIUM 100 MG PO CAPS: 100.0000 mg | ORAL_CAPSULE | Freq: Two times a day (BID) | ORAL | 0 refills | Status: DC

## 2018-04-01 MED ORDER — ONDANSETRON HCL 4 MG/2ML IJ SOLN
INTRAMUSCULAR | Status: AC
  Filled 2018-04-01: qty 2

## 2018-04-01 MED ORDER — PROPOFOL 10 MG/ML IV BOLUS
INTRAVENOUS | Status: DC | PRN
  Administered 2018-04-01: 10 mg via INTRAVENOUS
  Administered 2018-04-01: 20 mg via INTRAVENOUS
  Administered 2018-04-01 (×2): 10 mg via INTRAVENOUS
  Administered 2018-04-01 (×2): 20 mg via INTRAVENOUS

## 2018-04-01 MED ORDER — METHOCARBAMOL 500 MG PO TABS
500.0000 mg | ORAL_TABLET | Freq: Four times a day (QID) | ORAL | Status: DC | PRN
  Administered 2018-04-01 – 2018-04-02 (×2): 500 mg via ORAL
  Filled 2018-04-01 (×3): qty 1

## 2018-04-01 MED ORDER — BUPIVACAINE IN DEXTROSE 0.75-8.25 % IT SOLN
INTRATHECAL | Status: DC | PRN
  Administered 2018-04-01: 2 mL via INTRATHECAL

## 2018-04-01 MED ORDER — MAGNESIUM CITRATE PO SOLN: 1.0000 | Freq: Once | ORAL | Status: DC | PRN

## 2018-04-01 MED ORDER — POLYETHYLENE GLYCOL 3350 17 G PO PACK
17.0000 g | PACK | Freq: Two times a day (BID) | ORAL | Status: DC
  Administered 2018-04-01 – 2018-04-02 (×3): 17 g via ORAL
  Filled 2018-04-01 (×4): qty 1

## 2018-04-01 MED ORDER — POTASSIUM CHLORIDE CRYS ER 20 MEQ PO TBCR
20.0000 meq | EXTENDED_RELEASE_TABLET | Freq: Every day | ORAL | Status: DC
  Administered 2018-04-01 – 2018-04-02 (×2): 20 meq via ORAL
  Filled 2018-04-01 (×2): qty 1

## 2018-04-01 MED ORDER — CEFAZOLIN SODIUM-DEXTROSE 2-4 GM/100ML-% IV SOLN
2.0000 g | Freq: Four times a day (QID) | INTRAVENOUS | Status: AC
  Administered 2018-04-01 (×2): 2 g via INTRAVENOUS
  Filled 2018-04-01 (×2): qty 100

## 2018-04-01 MED ORDER — METOPROLOL SUCCINATE ER 100 MG PO TB24
100.0000 mg | ORAL_TABLET | Freq: Every day | ORAL | Status: DC
  Administered 2018-04-02: 100 mg via ORAL
  Filled 2018-04-01: qty 1

## 2018-04-01 MED ORDER — PROPOFOL 10 MG/ML IV BOLUS
INTRAVENOUS | Status: AC
Start: 2018-04-01 — End: ?
  Filled 2018-04-01: qty 20

## 2018-04-01 MED ORDER — METHOCARBAMOL 500 MG PO TABS: 500.0000 mg | ORAL_TABLET | Freq: Four times a day (QID) | ORAL | 0 refills | Status: DC | PRN

## 2018-04-01 MED ORDER — ONDANSETRON HCL 4 MG/2ML IJ SOLN: 4.0000 mg | Freq: Four times a day (QID) | INTRAMUSCULAR | Status: DC | PRN

## 2018-04-01 MED ORDER — HYDROMORPHONE HCL 1 MG/ML IJ SOLN: 0.5000 mg | INTRAMUSCULAR | Status: DC | PRN

## 2018-04-01 MED ORDER — MIDAZOLAM HCL 2 MG/2ML IJ SOLN
INTRAMUSCULAR | Status: AC
  Filled 2018-04-01: qty 2

## 2018-04-01 MED ORDER — SODIUM CHLORIDE 0.9 % IR SOLN
Status: DC | PRN
  Administered 2018-04-01: 1000 mL

## 2018-04-01 MED ORDER — POLYETHYLENE GLYCOL 3350 17 G PO PACK: 17.0000 g | PACK | Freq: Two times a day (BID) | ORAL | 0 refills | Status: DC

## 2018-04-01 MED ORDER — METHOCARBAMOL 1000 MG/10ML IJ SOLN
500.0000 mg | Freq: Four times a day (QID) | INTRAMUSCULAR | Status: DC | PRN
  Administered 2018-04-01: 500 mg via INTRAVENOUS
  Filled 2018-04-01: qty 550

## 2018-04-01 MED ORDER — HYDROCODONE-ACETAMINOPHEN 7.5-325 MG PO TABS
1.0000 | ORAL_TABLET | ORAL | Status: DC | PRN
  Administered 2018-04-01 – 2018-04-02 (×3): 1 via ORAL
  Filled 2018-04-01: qty 2
  Filled 2018-04-01 (×2): qty 1

## 2018-04-01 MED ORDER — LACTATED RINGERS IV SOLN
INTRAVENOUS | Status: DC
  Administered 2018-04-01 (×2): via INTRAVENOUS

## 2018-04-01 MED ORDER — FENTANYL CITRATE (PF) 100 MCG/2ML IJ SOLN
25.0000 ug | INTRAMUSCULAR | Status: DC | PRN
Start: 2018-04-01 — End: 2018-04-01

## 2018-04-01 MED ORDER — FERROUS SULFATE 325 (65 FE) MG PO TABS
325.0000 mg | ORAL_TABLET | Freq: Three times a day (TID) | ORAL | Status: DC
  Administered 2018-04-01 – 2018-04-02 (×2): 325 mg via ORAL
  Filled 2018-04-01 (×2): qty 1

## 2018-04-01 MED ORDER — ASPIRIN 81 MG PO CHEW: 81.0000 mg | CHEWABLE_TABLET | Freq: Two times a day (BID) | ORAL | 0 refills | Status: AC

## 2018-04-01 MED ORDER — SODIUM CHLORIDE 0.9 % IV SOLN
INTRAVENOUS | Status: DC
  Administered 2018-04-01: 21:00:00 via INTRAVENOUS
  Administered 2018-04-01: 1000 mL via INTRAVENOUS

## 2018-04-01 MED ORDER — DEXAMETHASONE SODIUM PHOSPHATE 10 MG/ML IJ SOLN
10.0000 mg | Freq: Once | INTRAMUSCULAR | Status: AC
  Administered 2018-04-01: 10 mg via INTRAVENOUS

## 2018-04-01 MED ORDER — FENTANYL CITRATE (PF) 100 MCG/2ML IJ SOLN
INTRAMUSCULAR | Status: AC
  Filled 2018-04-01: qty 2

## 2018-04-01 MED ORDER — ACETAMINOPHEN 325 MG PO TABS: 325.0000 mg | ORAL_TABLET | Freq: Four times a day (QID) | ORAL | Status: DC | PRN

## 2018-04-01 MED ORDER — ASPIRIN 81 MG PO CHEW
81.0000 mg | CHEWABLE_TABLET | Freq: Two times a day (BID) | ORAL | Status: DC
  Administered 2018-04-01 – 2018-04-02 (×2): 81 mg via ORAL
  Filled 2018-04-01 (×2): qty 1

## 2018-04-01 MED ORDER — ONDANSETRON HCL 4 MG/2ML IJ SOLN
INTRAMUSCULAR | Status: DC | PRN
  Administered 2018-04-01: 4 mg via INTRAVENOUS

## 2018-04-01 MED ORDER — PROPOFOL 10 MG/ML IV BOLUS
INTRAVENOUS | Status: AC
  Filled 2018-04-01: qty 20

## 2018-04-01 MED ORDER — DOCUSATE SODIUM 100 MG PO CAPS
100.0000 mg | ORAL_CAPSULE | Freq: Two times a day (BID) | ORAL | Status: DC
  Administered 2018-04-01 – 2018-04-02 (×3): 100 mg via ORAL
  Filled 2018-04-01 (×3): qty 1

## 2018-04-01 MED ORDER — METOCLOPRAMIDE HCL 5 MG PO TABS: 5.0000 mg | ORAL_TABLET | Freq: Three times a day (TID) | ORAL | Status: DC | PRN

## 2018-04-01 MED ORDER — PROMETHAZINE HCL 25 MG/ML IJ SOLN
6.2500 mg | INTRAMUSCULAR | Status: DC | PRN
Start: 2018-04-01 — End: 2018-04-01

## 2018-04-01 SURGICAL SUPPLY — 35 items
BAG DECANTER FOR FLEXI CONT (MISCELLANEOUS) IMPLANT
BAG ZIPLOCK 12X15 (MISCELLANEOUS) IMPLANT
BLADE SAG 18X100X1.27 (BLADE) ×3 IMPLANT
CAPT HIP TOTAL 2 ×3 IMPLANT
CLOTH BEACON ORANGE TIMEOUT ST (SAFETY) ×3 IMPLANT
COVER PERINEAL POST (MISCELLANEOUS) ×3 IMPLANT
COVER SURGICAL LIGHT HANDLE (MISCELLANEOUS) ×3 IMPLANT
DERMABOND ADVANCED (GAUZE/BANDAGES/DRESSINGS) ×2
DERMABOND ADVANCED .7 DNX12 (GAUZE/BANDAGES/DRESSINGS) ×1 IMPLANT
DRAPE STERI IOBAN 125X83 (DRAPES) ×3 IMPLANT
DRAPE U-SHAPE 47X51 STRL (DRAPES) ×6 IMPLANT
DRESSING AQUACEL AG SP 3.5X10 (GAUZE/BANDAGES/DRESSINGS) ×1 IMPLANT
DRSG AQUACEL AG SP 3.5X10 (GAUZE/BANDAGES/DRESSINGS) ×3
DURAPREP 26ML APPLICATOR (WOUND CARE) ×3 IMPLANT
ELECT REM PT RETURN 15FT ADLT (MISCELLANEOUS) ×3 IMPLANT
GLOVE BIOGEL M STRL SZ7.5 (GLOVE) IMPLANT
GLOVE BIOGEL PI IND STRL 7.5 (GLOVE) ×5 IMPLANT
GLOVE BIOGEL PI IND STRL 8.5 (GLOVE) ×1 IMPLANT
GLOVE BIOGEL PI INDICATOR 7.5 (GLOVE) ×10
GLOVE BIOGEL PI INDICATOR 8.5 (GLOVE) ×2
GLOVE ECLIPSE 8.0 STRL XLNG CF (GLOVE) ×6 IMPLANT
GLOVE ORTHO TXT STRL SZ7.5 (GLOVE) ×3 IMPLANT
GOWN STRL REUS W/TWL 2XL LVL3 (GOWN DISPOSABLE) ×6 IMPLANT
GOWN STRL REUS W/TWL LRG LVL3 (GOWN DISPOSABLE) ×6 IMPLANT
HOLDER FOLEY CATH W/STRAP (MISCELLANEOUS) ×3 IMPLANT
PACK ANTERIOR HIP CUSTOM (KITS) ×3 IMPLANT
SUT MNCRL AB 4-0 PS2 18 (SUTURE) ×3 IMPLANT
SUT STRATAFIX 0 PDS 27 VIOLET (SUTURE) ×3
SUT VIC AB 1 CT1 36 (SUTURE) ×9 IMPLANT
SUT VIC AB 2-0 CT1 27 (SUTURE) ×4
SUT VIC AB 2-0 CT1 TAPERPNT 27 (SUTURE) ×2 IMPLANT
SUTURE STRATFX 0 PDS 27 VIOLET (SUTURE) ×1 IMPLANT
TRAY FOLEY MTR SLVR 16FR STAT (SET/KITS/TRAYS/PACK) IMPLANT
WATER STERILE IRR 1000ML POUR (IV SOLUTION) ×3 IMPLANT
YANKAUER SUCT BULB TIP 10FT TU (MISCELLANEOUS) IMPLANT

## 2018-04-01 NOTE — Evaluation (Addendum)
Physical Therapy Evaluation Patient Details Name: Walter Anderson MRN: [id number] DOB: 03/30/1946 Today's Date: 04/01/2018   History of Present Illness  72 yo male s/p R THA-direct anterior 04/01/18  Clinical Impression  On eval POD 0, pt was Min guard assist for mobility. He walked ~100 feet with a RW. Pt c/o mild pain with activity. Will progress activity as tolerated. Per chart, plan is for HEP.     Follow Up Recommendations Follow surgeon's recommendation for DC plan and follow-up therapies    Equipment Recommendations  None recommended by PT    Recommendations for Other Services       Precautions / Restrictions Precautions Precautions: Fall Restrictions Weight Bearing Restrictions: No Other Position/Activity Restrictions: WBAT      Mobility  Bed Mobility Overal bed mobility: Needs Assistance Bed Mobility: Supine to Sit     Supine to sit: Min guard;HOB elevated     General bed mobility comments: close guard for safety.   Transfers Overall transfer level: Needs assistance Equipment used: Rolling walker (2 wheeled) Transfers: Sit to/from Stand Sit to Stand: Min guard;From elevated surface         General transfer comment: close guard for safety. VCS safety, hand placement  Ambulation/Gait Ambulation/Gait assistance: Min guard Ambulation Distance (Feet): 100 Feet Assistive device: Rolling walker (2 wheeled) Gait Pattern/deviations: Step-to pattern;Step-through pattern;Decreased stride length     General Gait Details: close guard for safety.  VCs safety, sequence  Stairs            Wheelchair Mobility    Modified Rankin (Stroke Patients Only)       Balance Overall balance assessment: Mild deficits observed, not formally tested                                           Pertinent Vitals/Pain Pain Assessment: 0-10 Pain Score: 5  Pain Location: R hip Pain Descriptors / Indicators: Discomfort;Sore Pain Intervention(s):  Monitored during session;Repositioned;Ice applied    Home Living Family/patient expects to be discharged to:: Private residence Living Arrangements: Spouse/significant other Available Help at Discharge: Family Type of Home: House Home Access: Stairs to enter Entrance Stairs-Rails: Right Entrance Stairs-Number of Steps: 2 Home Layout: One level Home Equipment: Environmental consultant - 2 wheels;Cane - single point      Prior Function Level of Independence: Independent               Hand Dominance        Extremity/Trunk Assessment   Upper Extremity Assessment Upper Extremity Assessment: Overall WFL for tasks assessed    Lower Extremity Assessment Lower Extremity Assessment: Generalized weakness(s/p R THA)    Cervical / Trunk Assessment Cervical / Trunk Assessment: Normal  Communication   Communication: No difficulties  Cognition Arousal/Alertness: Awake/alert Behavior During Therapy: WFL for tasks assessed/performed Overall Cognitive Status: Within Functional Limits for tasks assessed                                        General Comments      Exercises     Assessment/Plan    PT Assessment Patient needs continued PT services  PT Problem List Decreased strength;Decreased range of motion;Decreased balance;Decreased mobility;Pain;Decreased activity tolerance       PT Treatment Interventions DME instruction;Gait training;Functional mobility training;Therapeutic activities;Patient/family education;Balance training;Therapeutic exercise;Stair  training    PT Goals (Current goals can be found in the Care Plan section)  Acute Rehab PT Goals Patient Stated Goal: regain plof.  PT Goal Formulation: With patient/family Time For Goal Achievement: 04/15/18 Potential to Achieve Goals: Good    Frequency 7X/week   Barriers to discharge        Co-evaluation               AM-PAC PT "6 Clicks" Daily Activity  Outcome Measure Difficulty turning over in bed  (including adjusting bedclothes, sheets and blankets)?: A Little Difficulty moving from lying on back to sitting on the side of the bed? : A Little Difficulty sitting down on and standing up from a chair with arms (e.g., wheelchair, bedside commode, etc,.)?: A Little Help needed moving to and from a bed to chair (including a wheelchair)?: A Little Help needed walking in hospital room?: A Little Help needed climbing 3-5 steps with a railing? : A Little 6 Click Score: 18    End of Session Equipment Utilized During Treatment: Gait belt Activity Tolerance: Patient tolerated treatment well Patient left: in chair;with call bell/phone within reach;with family/visitor present   PT Visit Diagnosis: Pain;Difficulty in walking, not elsewhere classified (R26.2);Other abnormalities of gait and mobility (R26.89) Pain - Right/Left: Right Pain - part of body: Hip    Time: 9147-8295 PT Time Calculation (min) (ACUTE ONLY): 10 min   Charges:   PT Evaluation $PT Eval Low Complexity: 1 Low     PT G Codes:          Rebeca Alert, MPT Pager: [phone]

## 2018-04-01 NOTE — Anesthesia Preprocedure Evaluation (Addendum)
Anesthesia Evaluation  Patient identified by MRN, date of birth, ID band Patient awake    Reviewed: Allergy & Precautions, NPO status , Patient's Chart, lab work & pertinent test results, reviewed documented beta blocker date and time   History of Anesthesia Complications Negative for: history of anesthetic complications  Airway Mallampati: III  TM Distance: >3 FB Neck ROM: Full    Dental  (+) Poor Dentition, Dental Advisory Given   Pulmonary sleep apnea ,    Pulmonary exam normal        Cardiovascular hypertension, Pt. on home beta blockers Normal cardiovascular exam     Neuro/Psych negative neurological ROS  negative psych ROS   GI/Hepatic negative GI ROS, Neg liver ROS,   Endo/Other  Morbid obesity  Renal/GU negative Renal ROS     Musculoskeletal  (+) Arthritis ,   Abdominal   Peds  Hematology   Anesthesia Other Findings   Reproductive/Obstetrics                            Anesthesia Physical Anesthesia Plan  ASA: III  Anesthesia Plan: Spinal and MAC   Post-op Pain Management:    Induction:   PONV Risk Score and Plan: 2 and Ondansetron and Propofol infusion  Airway Management Planned: Natural Airway  Additional Equipment:   Intra-op Plan:   Post-operative Plan:   Informed Consent: I have reviewed the patients History and Physical, chart, labs and discussed the procedure including the risks, benefits and alternatives for the proposed anesthesia with the patient or authorized representative who has indicated his/her understanding and acceptance.   Dental advisory given  Plan Discussed with: Anesthesiologist and CRNA  Anesthesia Plan Comments:        Anesthesia Quick Evaluation

## 2018-04-01 NOTE — Op Note (Signed)
NAME:  Walter Anderson                ACCOUNT NO.: [id number]      MEDICAL RECORD NO.: [id number]      FACILITY:  [facility]      PHYSICIAN:  Shelda Pal  DATE OF BIRTH:  [birth year]     DATE OF PROCEDURE:  04/01/2018                                 OPERATIVE REPORT         PREOPERATIVE DIAGNOSIS: Right  hip osteoarthritis.      POSTOPERATIVE DIAGNOSIS:  Right hip osteoarthritis.      PROCEDURE:  Right total hip replacement through an anterior approach   utilizing DePuy THR system, component size 52mm pinnacle cup, a size 36+4 neutral   Altrex liner, a size 6 Hi Actis stem with a 36+8.5 delta ceramic   ball.      SURGEON:  Madlyn Frankel. Charlann Boxer, M.D.      ASSISTANT:  Lanney Gins, PA-C     ANESTHESIA:  Spinal.      SPECIMENS:  None.      COMPLICATIONS:  None.      BLOOD LOSS:  400 cc     DRAINS:  None.      INDICATION OF THE PROCEDURE:  Walter Anderson is a 72 y.o. male who had   presented to office for evaluation of right hip pain.  Radiographs revealed   progressive degenerative changes with bone-on-bone   articulation to the  hip joint.  The patient had painful limited range of   motion significantly affecting their overall quality of life.  The patient was failing to    respond to conservative measures, and at this point was ready   to proceed with more definitive measures.  The patient has noted progressive   degenerative changes in his hip, progressive problems and dysfunction   with regarding the hip prior to surgery.  Consent was obtained for   benefit of pain relief.  Specific risk of infection, DVT, component   failure, dislocation, need for revision surgery, as well discussion of   the anterior versus posterior approach were reviewed.  Consent was   obtained for benefit of anterior pain relief through an anterior   approach.      PROCEDURE IN DETAIL:  The patient was brought to operative theater.   Once adequate anesthesia, preoperative  antibiotics, 3 gm of Ancef, 1 gm of Tranexamic Acid, and 10 mg of Decadron administered.   The patient was positioned supine on the OSI Hanna table.  Once adequate   padding of boney process was carried out, we had predraped out the hip, and  used fluoroscopy to confirm orientation of the pelvis and position.      The right hip was then prepped and draped from proximal iliac crest to   mid thigh with shower curtain technique.      Time-out was performed identifying the patient, planned procedure, and   extremity.     An incision was then made 2 cm distal and lateral to the   anterior superior iliac spine extending over the orientation of the   tensor fascia lata muscle and sharp dissection was carried down to the   fascia of the muscle and protractor placed in the soft tissues.      The fascia  was then incised.  The muscle belly was identified and swept   laterally and retractor placed along the superior neck.  Following   cauterization of the circumflex vessels and removing some pericapsular   fat, a second cobra retractor was placed on the inferior neck.  A third   retractor was placed on the anterior acetabulum after elevating the   anterior rectus.  A L-capsulotomy was along the line of the   superior neck to the trochanteric fossa, then extended proximally and   distally.  Tag sutures were placed and the retractors were then placed   intracapsular.  We then identified the trochanteric fossa and   orientation of my neck cut, confirmed this radiographically   and then made a neck osteotomy with the femur on traction.  The femoral   head was removed without difficulty or complication.  Traction was let   off and retractors were placed posterior and anterior around the   acetabulum.      The labrum and foveal tissue were debrided.  I began reaming with a 46mm   reamer and reamed up to 51mm reamer with good bony bed preparation and a 52 mm  cup was chosen.  The final 52mm Pinnacle  cup was then impacted under fluoroscopy  to confirm the depth of penetration and orientation with respect to   abduction.  A screw was placed followed by the hole eliminator.  The final   36+4 neutral Altrex liner was impacted with good visualized rim fit.  The cup was positioned anatomically within the acetabular portion of the pelvis.      At this point, the femur was rolled at 80 degrees.  Further capsule was   released off the inferior aspect of the femoral neck.  I then   released the superior capsule proximally.  The hook was placed laterally   along the femur and elevated manually and held in position with the bed   hook.  The leg was then extended and adducted with the leg rolled to 100   degrees of external rotation.  Once the proximal femur was fully   exposed, I used a box osteotome to set orientation.  I then began   broaching with the starting chili pepper broach and passed this by hand and then broached up to 6.  With the 6 broach in place I chose a high offset neck and did several trial reductions.  The offset was appropriate, leg lengths   appeared to be equal best matched to preop films and based on comparing to his left hip with the +8.5 head ball confirmed radiographically.   Given these findings, I went ahead and dislocated the hip, repositioned all   retractors and positioned the right hip in the extended and abducted position.  The final 6 Hi Actis stem was   chosen and it was impacted down to the level of neck cut.  Based on this   and the trial reduction, a 36+8.5 delta ceramic ball was chosen and   impacted onto a clean and dry trunnion, and the hip was reduced.  The   hip had been irrigated throughout the case again at this point.  I did   reapproximate the superior capsular leaflet to the anterior leaflet   using #1 Vicryl.  The fascia of the   tensor fascia lata muscle was then reapproximated using #1 Vicryl and #0 Stratafix sutures.  The   remaining wound was  closed with 2-0 Vicryl and  running 4-0 Monocryl.   The hip was cleaned, dried, and dressed sterilely using Dermabond and   Aquacel dressing.  He was then brought   to recovery room in stable condition tolerating the procedure well.    Lanney Gins, PA-C was present for the entirety of the case involved from   preoperative positioning, perioperative retractor management, general   facilitation of the case, as well as primary wound closure as assistant.            Madlyn Frankel Charlann Boxer, M.D.        04/01/2018 10:58 AM

## 2018-04-01 NOTE — Anesthesia Procedure Notes (Signed)
Spinal  Patient location during procedure: OR Start time: 04/01/2018 9:32 AM End time: 04/01/2018 9:42 AM Staffing Anesthesiologist: Heather Roberts, MD Performed: anesthesiologist  Preanesthetic Checklist Completed: patient identified, surgical consent, pre-op evaluation, timeout performed, IV checked, risks and benefits discussed and monitors and equipment checked Spinal Block Patient position: sitting Prep: DuraPrep Patient monitoring: cardiac monitor, continuous pulse ox and blood pressure Approach: midline Location: L2-3 Injection technique: single-shot Needle Needle type: Pencan  Needle gauge: 24 G Needle length: 9 cm Additional Notes Functioning IV was confirmed and monitors were applied. Sterile prep and drape, including hand hygiene and sterile gloves were used. The patient was positioned and the spine was prepped. The skin was anesthetized with lidocaine.  Free flow of clear CSF was obtained prior to injecting local anesthetic into the CSF.  The spinal needle aspirated freely following injection.  The needle was carefully withdrawn.  The patient tolerated the procedure well.

## 2018-04-01 NOTE — Discharge Instructions (Signed)

## 2018-04-01 NOTE — Interval H&P Note (Signed)
History and Physical Interval Note:  04/01/2018 8:46 AM  Walter Anderson  has presented today for surgery, with the diagnosis of Right hip osteoarthritis  The various methods of treatment have been discussed with the patient and family. After consideration of risks, benefits and other options for treatment, the patient has consented to  Procedure(s) with comments: RIGHT TOTAL HIP ARTHROPLASTY ANTERIOR APPROACH (Right) - 70 mins as a surgical intervention .  The patient's history has been reviewed, patient examined, no change in status, stable for surgery.  I have reviewed the patient's chart and labs.  Questions were answered to the patient's satisfaction.     Shelda Pal

## 2018-04-01 NOTE — Transfer of Care (Signed)
Immediate Anesthesia Transfer of Care Note  Patient: Walter Anderson  Procedure(s) Performed: RIGHT TOTAL HIP ARTHROPLASTY ANTERIOR APPROACH (Right Hip)  Patient Location: PACU  Anesthesia Type:Spinal  Level of Consciousness: drowsy and patient cooperative  Airway & Oxygen Therapy: Patient Spontanous Breathing and Patient connected to face mask oxygen  Post-op Assessment: Report given to RN and Post -op Vital signs reviewed and stable  Post vital signs: Reviewed and stable  Last Vitals:  Vitals Value Taken Time  BP    Temp    Pulse 53 04/01/2018 11:31 AM  Resp 15 04/01/2018 11:31 AM  SpO2 96 % 04/01/2018 11:31 AM  Vitals shown include unvalidated device data.  Last Pain:  Vitals:   04/01/18 0803  TempSrc:   PainSc: 0-No pain         Complications: No apparent anesthesia complications

## 2018-04-01 NOTE — Anesthesia Postprocedure Evaluation (Signed)
Anesthesia Post Note  Patient: Walter Anderson  Procedure(s) Performed: RIGHT TOTAL HIP ARTHROPLASTY ANTERIOR APPROACH (Right Hip)     Patient location during evaluation: PACU Anesthesia Type: MAC and Spinal Level of consciousness: awake and alert Pain management: pain level controlled Vital Signs Assessment: post-procedure vital signs reviewed and stable Respiratory status: spontaneous breathing and respiratory function stable Cardiovascular status: blood pressure returned to baseline and stable Postop Assessment: spinal receding Anesthetic complications: no    Last Vitals:  Vitals:   04/01/18 1245 04/01/18 1258  BP: 134/79   Pulse: (!) 49   Resp: 16 16  Temp: (!) 36.3 C   SpO2: 99%     Last Pain:  Vitals:   04/01/18 1258  TempSrc:   PainSc: 0-No pain    LLE Motor Response: Purposeful movement (04/01/18 1258) LLE Sensation: Decreased;Numbness (04/01/18 1258) RLE Motor Response: Purposeful movement (04/01/18 1258) RLE Sensation: Decreased;Numbness (04/01/18 1258) L Sensory Level: L2-Upper inner thigh, upper buttock (04/01/18 1258) R Sensory Level: L2-Upper inner thigh, upper buttock (04/01/18 1258)  Fortune Torosian DANIEL

## 2018-04-02 LAB — BASIC METABOLIC PANEL
ANION GAP: 8 (ref 5–15)
BUN: 16 mg/dL (ref 6–20)
CALCIUM: 8.3 mg/dL — AB (ref 8.9–10.3)
CO2: 22 mmol/L (ref 22–32)
CREATININE: 0.98 mg/dL (ref 0.61–1.24)
Chloride: 106 mmol/L (ref 101–111)
Glucose, Bld: 146 mg/dL — ABNORMAL HIGH (ref 65–99)
Potassium: 4.5 mmol/L (ref 3.5–5.1)
SODIUM: 136 mmol/L (ref 135–145)

## 2018-04-02 LAB — CBC
HCT: 42.2 % (ref 39.0–52.0)
HEMOGLOBIN: 14.3 g/dL (ref 13.0–17.0)
MCH: 31.4 pg (ref 26.0–34.0)
MCHC: 33.9 g/dL (ref 30.0–36.0)
MCV: 92.7 fL (ref 78.0–100.0)
PLATELETS: 139 10*3/uL — AB (ref 150–400)
RBC: 4.55 MIL/uL (ref 4.22–5.81)
RDW: 14.5 % (ref 11.5–15.5)
WBC: 16.2 10*3/uL — ABNORMAL HIGH (ref 4.0–10.5)

## 2018-04-02 NOTE — Progress Notes (Signed)
Physical Therapy Treatment Patient Details Name: Walter Anderson MRN: [id number] DOB: Mar 20, 1946 Today's Date: 04/02/2018    History of Present Illness 72 yo male s/p R THA-direct anterior 04/01/18    PT Comments    Mobility is progressing very well. Reviewed/practiced exercises, gait training, and stair training. Issued HEP for pt to perform 2x/day until he follows up with surgeon. All education completed. Okay to d/c from PT standpoint-made RN aware.     Follow Up Recommendations  Follow surgeon's recommendation for DC plan and follow-up therapies     Equipment Recommendations  None recommended by PT    Recommendations for Other Services       Precautions / Restrictions Precautions Precautions: Fall Restrictions Weight Bearing Restrictions: No Other Position/Activity Restrictions: WBAT    Mobility  Bed Mobility Overal bed mobility: Modified Independent                Transfers Overall transfer level: Needs assistance Equipment used: Rolling walker (2 wheeled) Transfers: Sit to/from Stand Sit to Stand: Supervision         General transfer comment: for safety. Cues for hand placement  Ambulation/Gait Ambulation/Gait assistance: Supervision Ambulation Distance (Feet): 125 Feet Assistive device: Rolling walker (2 wheeled) Gait Pattern/deviations: Step-to pattern;Step-through pattern;Decreased stride length     General Gait Details: for safety   Stairs Stairs: Yes Stairs assistance: Min guard Stair Management: One rail Right;Step to pattern Number of Stairs: 5 General stair comments: up and over portable steps x 2. VCs safety, sequence. close guard for safety   Wheelchair Mobility    Modified Rankin (Stroke Patients Only)       Balance Overall balance assessment: Mild deficits observed, not formally tested                                          Cognition Arousal/Alertness: Awake/alert Behavior During Therapy: WFL for  tasks assessed/performed Overall Cognitive Status: Within Functional Limits for tasks assessed                                        Exercises Total Joint Exercises Ankle Circles/Pumps: AROM;Both;15 reps;Supine Quad Sets: AROM;15 reps;Supine Heel Slides: AAROM;Right;15 reps;Supine;AROM Hip ABduction/ADduction: AROM;Right;15 reps;Supine Knee Flexion: AROM;Right;Standing;10 reps Marching in Standing: AROM;Both;Standing;10 reps General Exercises - Lower Extremity Heel Raises: AROM;Both;10 reps;Standing    General Comments        Pertinent Vitals/Pain Pain Assessment: 0-10 Pain Score: 5  Pain Location: R hip Pain Descriptors / Indicators: Discomfort;Sore Pain Intervention(s): Patient requesting pain meds-RN notified    Home Living                      Prior Function            PT Goals (current goals can now be found in the care plan section) Progress towards PT goals: Progressing toward goals    Frequency    7X/week      PT Plan Current plan remains appropriate    Co-evaluation              AM-PAC PT "6 Clicks" Daily Activity  Outcome Measure  Difficulty turning over in bed (including adjusting bedclothes, sheets and blankets)?: None Difficulty moving from lying on back to sitting on the side of the bed? : None  Difficulty sitting down on and standing up from a chair with arms (e.g., wheelchair, bedside commode, etc,.)?: A Little Help needed moving to and from a bed to chair (including a wheelchair)?: A Little Help needed walking in hospital room?: A Little Help needed climbing 3-5 steps with a railing? : A Little 6 Click Score: 20    End of Session Equipment Utilized During Treatment: Gait belt Activity Tolerance: Patient tolerated treatment well Patient left: in bed;with call bell/phone within reach;with family/visitor present   PT Visit Diagnosis: Pain;Difficulty in walking, not elsewhere classified (R26.2);Other  abnormalities of gait and mobility (R26.89) Pain - Right/Left: Right Pain - part of body: Hip     Time: 1610-9604 PT Time Calculation (min) (ACUTE ONLY): 20 min  Charges:  $Gait Training: 8-22 mins                    G Codes:          Rebeca Alert, MPT Pager: [phone]

## 2018-04-02 NOTE — Progress Notes (Signed)
Patient discharged to home with family. Given all belongings, instructions prescriptions. Escorted to pov via w/c.

## 2018-04-02 NOTE — Progress Notes (Signed)
     Subjective: 1 Day Post-Op Procedure(s) (LRB): RIGHT TOTAL HIP ARTHROPLASTY ANTERIOR APPROACH (Right)   Patient reports pain as mild, pain controlled.  No events throughout the night.  Feels that he is doing well already.  Looking forward to progressing in his recovery.  Ready to be discharged home.   Patient's anticipated LOS is less than 2 midnights, meeting these requirements: - Lives within 1 hour of care - Has a competent adult at home to recover with post-op recover - NO history of  - Chronic pain requiring opiods  - Diabetes  - Coronary Artery Disease  - Heart failure  - Heart attack  - Stroke  - DVT/VTE  - Cardiac arrhythmia  - Respiratory Failure/COPD  - Renal failure  - Anemia  - Advanced Liver disease       Objective:   VITALS:   Vitals:   04/02/18 0121 04/02/18 0634  BP: 140/75 123/78  Pulse: (!) 59 (!) 54  Resp: 18 20  Temp: 98.4 F (36.9 C) (!) 97.5 F (36.4 C)  SpO2: 98% 95%    Dorsiflexion/Plantar flexion intact Incision: dressing C/D/I No cellulitis present Compartment soft  LABS Recent Labs    04/02/18 0508  HGB 14.3  HCT 42.2  WBC 16.2*  PLT 139*    Recent Labs    04/02/18 0508  NA 136  K 4.5  BUN 16  CREATININE 0.98  GLUCOSE 146*     Assessment/Plan: 1 Day Post-Op Procedure(s) (LRB): RIGHT TOTAL HIP ARTHROPLASTY ANTERIOR APPROACH (Right) Foley cath d/c'ed Advance diet Up with therapy D/C IV fluids Discharge home Follow up in 2 weeks at [facility]. Follow up with OLIN,Jalysa Swopes D in 2 weeks.  Contact information:  [facility] [address] [place] [address] [phone]    Obese (BMI 30-39.9) Estimated body mass index is 37.3 kg/m as calculated from the following:   Height as of this encounter: 6' (1.829 m).   Weight as of this encounter: 124.7 kg (275 lb). Patient also counseled that weight may inhibit the healing process Patient counseled  that losing weight will help with future health issues         Anastasio Auerbach. Merida Alcantar   PAC  04/02/2018, 8:57 AM

## 2018-04-08 NOTE — Discharge Summary (Signed)
Physician Discharge Summary  Patient ID: Walter Anderson MRN: [id number] DOB/AGE: 1946/07/16 72 y.o.  Admit date: 04/01/2018 Discharge date: 04/02/2018   Procedures:  Procedure(s) (LRB): RIGHT TOTAL HIP ARTHROPLASTY ANTERIOR APPROACH (Right)  Attending Physician:  Dr. Durene Romans   Admission Diagnoses:   Right hip primary OA / pain  Discharge Diagnoses:  Principal Problem:   S/P right THA, AA Active Problems:   S/P hip replacement  Past Medical History:  Diagnosis Date  . Allergy   . Arthritis   . Hypertension   . Sleep apnea    uses CPAP    HPI:    SHEDRICK Anderson, 72 y.o. male, has a history of pain and functional disability in the right hip(s) due to arthritis and patient has failed non-surgical conservative treatments for greater than 12 weeks to include NSAID's and/or analgesics and activity modification.  Onset of symptoms was gradual starting ~6 years ago with gradually worsening course since that time.The patient noted no past surgery on the right hip(s).  Patient currently rates pain in the right hip at 8 out of 10 with activity. Patient has night pain, worsening of pain with activity and weight bearing, trendelenberg gait, pain that interfers with activities of daily living and pain with passive range of motion. Patient has evidence of periarticular osteophytes and joint space narrowing by imaging studies. This condition presents safety issues increasing the risk of falls.  There is no current active infection.  Risks, benefits and expectations were discussed with the patient.  Risks including but not limited to the risk of anesthesia, blood clots, nerve damage, blood vessel damage, failure of the prosthesis, infection and up to and including death.  Patient understand the risks, benefits and expectations and wishes to proceed with surgery.   PCP: Lonie Peak, PA-C   Discharged Condition: good  Hospital Course:  Patient underwent the above stated procedure on  04/01/2018. Patient tolerated the procedure well and brought to the recovery room in good condition and subsequently to the floor.  POD #1 BP: 123/78 ; Pulse: 54 ; Temp: 97.5 F (36.4 C) ; Resp: 20 Patient reports pain as mild, pain controlled.  No events throughout the night.  Feels that he is doing well already.  Looking forward to progressing in his recovery.  Ready to be discharged home. Dorsiflexion/plantar flexion intact, incision: dressing C/D/I, no cellulitis present and compartment soft.   LABS  Basename    HGB     14.3  HCT     42.2    Discharge Exam: General appearance: alert, cooperative and no distress Extremities: Homans sign is negative, no sign of DVT, no edema, redness or tenderness in the calves or thighs and no ulcers, gangrene or trophic changes  Disposition:  Home with follow up in 2 weeks   Follow-up Information    Durene Romans, MD. Schedule an appointment as soon as possible for a visit in 2 weeks.   Specialty:  Orthopedic Surgery Contact information: [address] [place] [address] [phone]           Discharge Instructions    Call MD / Call 911   Complete by:  As directed    If you experience chest pain or shortness of breath, CALL 911 and be transported to the hospital emergency room.  If you develope a fever above 101 F, pus (white drainage) or increased drainage or redness at the wound, or calf pain, call your surgeon's office.   Call MD /  Call 911   Complete by:  As directed    If you experience chest pain or shortness of breath, CALL 911 and be transported to the hospital emergency room.  If you develope a fever above 101 F, pus (white drainage) or increased drainage or redness at the wound, or calf pain, call your surgeon's office.   Change dressing   Complete by:  As directed    Maintain surgical dressing until follow up in the clinic. If the edges start to pull up, may reinforce with tape. If the dressing is no longer  working, may remove and cover with gauze and tape, but must keep the area dry and clean.  Call with any questions or concerns.   Change dressing   Complete by:  As directed    Maintain surgical dressing until follow up in the clinic. If the edges start to pull up, may reinforce with tape. If the dressing is no longer working, may remove and cover with gauze and tape, but must keep the area dry and clean.  Call with any questions or concerns.   Constipation Prevention   Complete by:  As directed    Drink plenty of fluids.  Prune juice may be helpful.  You may use a stool softener, such as Colace (over the counter) 100 mg twice a day.  Use MiraLax (over the counter) for constipation as needed.   Constipation Prevention   Complete by:  As directed    Drink plenty of fluids.  Prune juice may be helpful.  You may use a stool softener, such as Colace (over the counter) 100 mg twice a day.  Use MiraLax (over the counter) for constipation as needed.   Diet - low sodium heart healthy   Complete by:  As directed    Diet - low sodium heart healthy   Complete by:  As directed    Discharge instructions   Complete by:  As directed    Maintain surgical dressing until follow up in the clinic. If the edges start to pull up, may reinforce with tape. If the dressing is no longer working, may remove and cover with gauze and tape, but must keep the area dry and clean.  Follow up in 2 weeks at [facility]. Call with any questions or concerns.   Discharge instructions   Complete by:  As directed    Maintain surgical dressing until follow up in the clinic. If the edges start to pull up, may reinforce with tape. If the dressing is no longer working, may remove and cover with gauze and tape, but must keep the area dry and clean.  Follow up in 2 weeks at [facility]. Call with any questions or concerns.   Increase activity slowly as tolerated   Complete by:  As directed    Weight bearing as  tolerated with assist device (walker, cane, etc) as directed, use it as long as suggested by your surgeon or therapist, typically at least 4-6 weeks.   Increase activity slowly as tolerated   Complete by:  As directed    Weight bearing as tolerated with assist device (walker, cane, etc) as directed, use it as long as suggested by your surgeon or therapist, typically at least 4-6 weeks.   TED hose   Complete by:  As directed    Use stockings (TED hose) for 2 weeks on both leg(s).  You may remove them at night for sleeping.   TED hose   Complete by:  As directed    Use stockings (TED hose) for 2 weeks on both leg(s).  You may remove them at night for sleeping.      Allergies as of 04/02/2018   No Known Allergies     Medication List    TAKE these medications   albuterol 108 (90 Base) MCG/ACT inhaler Commonly known as:  PROVENTIL HFA;VENTOLIN HFA Inhale 1-2 puffs into the lungs every 6 (six) hours as needed for wheezing or shortness of breath.   aspirin 81 MG chewable tablet Commonly known as:  ASPIRIN CHILDRENS Chew 1 tablet (81 mg total) by mouth 2 (two) times daily. Take for 4 weeks, then resume regular dose. What changed:    when to take this  additional instructions   BROMELAIN PO Take 240 mg by mouth daily.   Coenzyme Q10 400 MG Caps Take 1 capsule by mouth daily.   docusate sodium 100 MG capsule Commonly known as:  COLACE Take 1 capsule (100 mg total) by mouth 2 (two) times daily.   doxazosin 4 MG tablet Commonly known as:  CARDURA Take 1 tablet by mouth 2 (two) times daily.   ferrous sulfate 325 (65 FE) MG tablet Commonly known as:  FERROUSUL Take 1 tablet (325 mg total) by mouth 3 (three) times daily with meals.   HYDROcodone-acetaminophen 7.5-325 MG tablet Commonly known as:  NORCO Take 1-2 tablets by mouth every 4 (four) hours as needed for moderate pain.   magnesium gluconate 500 MG tablet Commonly known as:  MAGONATE Take 500 mg by mouth 2 (two) times  daily.   methocarbamol 500 MG tablet Commonly known as:  ROBAXIN Take 1 tablet (500 mg total) by mouth every 6 (six) hours as needed for muscle spasms.   metoprolol succinate 100 MG 24 hr tablet Commonly known as:  TOPROL-XL Take 100 mg by mouth daily.   multivitamin with minerals Tabs tablet Take 1 tablet by mouth daily.   niacin 500 MG tablet Take 500 mg by mouth at bedtime.   omega-3 acid ethyl esters 1 g capsule Commonly known as:  LOVAZA Take 2 g by mouth daily.   polyethylene glycol packet Commonly known as:  MIRALAX / GLYCOLAX Take 17 g by mouth 2 (two) times daily.   potassium chloride SA 20 MEQ tablet Commonly known as:  K-DUR,KLOR-CON Take 1 tablet by mouth daily.   psyllium 95 % Pack Commonly known as:  HYDROCIL/METAMUCIL Take 2 packets by mouth daily.   testosterone 50 MG/5GM (1%) Gel Commonly known as:  ANDROGEL Place 5 g onto the skin daily.   TURMERIC PO Take 750 mg by mouth daily.   valsartan 320 MG tablet Commonly known as:  DIOVAN Take 1 tablet by mouth daily.            Discharge Care Instructions  (From admission, onward)        Start     Ordered   04/02/18 0000  Change dressing    Comments:  Maintain surgical dressing until follow up in the clinic. If the edges start to pull up, may reinforce with tape. If the dressing is no longer working, may remove and cover with gauze and tape, but must keep the area dry and clean.  Call with any questions or concerns.   04/02/18 0901   04/02/18 0000  Change dressing    Comments:  Maintain surgical dressing until follow up in the clinic. If the edges start to pull up, may reinforce with tape. If the dressing is  no longer working, may remove and cover with gauze and tape, but must keep the area dry and clean.  Call with any questions or concerns.   04/02/18 1191       Signed: Anastasio Auerbach. Zia Kanner   PA-C  04/08/2018, 9:35 AM

## 2019-08-13 ENCOUNTER — Other Ambulatory Visit: Payer: Self-pay | Admitting: Physician Assistant

## 2019-08-13 DIAGNOSIS — R0602 Shortness of breath: Secondary | ICD-10-CM

## 2019-08-24 ENCOUNTER — Ambulatory Visit
Admission: RE | Admit: 2019-08-24 | Discharge: 2019-08-24 | Disposition: A | Discharge status: Home / Self Care | Payer: Medicare Other | Source: Ambulatory Visit | Attending: Physician Assistant | Admitting: Physician Assistant

## 2019-08-24 ENCOUNTER — Other Ambulatory Visit: Payer: Self-pay

## 2019-08-24 DIAGNOSIS — R0602 Shortness of breath: Secondary | ICD-10-CM | POA: Insufficient documentation

## 2019-08-24 NOTE — Progress Notes (Signed)
*  PRELIMINARY RESULTS* Echocardiogram 2D Echocardiogram has been performed.  [name] 08/24/2019, 10:29 AM

## 2019-09-10 ENCOUNTER — Ambulatory Visit
Admission: RE | Admit: 2019-09-10 | Discharge: 2019-09-10 | Disposition: A | Discharge status: Home / Self Care | Payer: Medicare Other | Source: Ambulatory Visit | Attending: Pulmonary Disease | Admitting: Pulmonary Disease

## 2019-09-10 ENCOUNTER — Encounter: Payer: Self-pay | Admitting: Pulmonary Disease

## 2019-09-10 ENCOUNTER — Ambulatory Visit: Payer: Medicare Other | Admitting: Pulmonary Disease

## 2019-09-10 ENCOUNTER — Other Ambulatory Visit: Payer: Self-pay

## 2019-09-10 VITALS — BP 144/80 | HR 82 | Temp 97.4°F | Ht 72.0 in | Wt 301.8 lb

## 2019-09-10 DIAGNOSIS — Z9989 Dependence on other enabling machines and devices: Secondary | ICD-10-CM

## 2019-09-10 DIAGNOSIS — J302 Other seasonal allergic rhinitis: Secondary | ICD-10-CM

## 2019-09-10 DIAGNOSIS — G4733 Obstructive sleep apnea (adult) (pediatric): Secondary | ICD-10-CM

## 2019-09-10 DIAGNOSIS — R0600 Dyspnea, unspecified: Secondary | ICD-10-CM

## 2019-09-10 DIAGNOSIS — Z6841 Body Mass Index (BMI) 40.0 and over, adult: Secondary | ICD-10-CM

## 2019-09-10 DIAGNOSIS — J3089 Other allergic rhinitis: Secondary | ICD-10-CM | POA: Diagnosis not present

## 2019-09-10 MED ORDER — MONTELUKAST SODIUM 10 MG PO TABS: 10.0000 mg | ORAL_TABLET | Freq: Every day | ORAL | 6 refills | Status: DC

## 2019-09-10 NOTE — Patient Instructions (Signed)
1.  We are going to add a chest x-ray and breathing test.  2.  We sent a prescription for Singulair 1 tablet daily for your congestion.  3.  We will see you in follow-up in 2 months time.  Call sooner should any new difficulties arise.

## 2019-09-10 NOTE — Progress Notes (Signed)
Subjective:    Patient ID: Walter Anderson, male    DOB: [birth year], 73 y.o.   MRN: [id number]  HPI This is a 73 year old morbidly obese lifelong never smoker, who presents for evaluation of dyspnea and upper airway congestion with seasonal variation for the last 5 years.  Patient has noted increasing dyspnea of late.  He also notices that over the last year he has had over 20 pounds of weight gain.  Today he weighs in at 301 pounds.  BMI is 41.  The patient has not had any pulmonary function testing previously last chest x-ray was in 2017.  He recently had a 2D echo that does show that he has grade 1 diastolic dysfunction.  He has a history of sleep apnea and is on CPAP he has unsure what pressure setting he is on.  He notes good sleep with his CPAP.  He still has occasional daytime somnolence.  He does not endorse any gastroesophageal reflux symptoms or heartburn.  He does note occasional lower extremity edema.  He does not describe any paroxysmal nocturnal dyspnea nor orthopnea.  He has not had any fevers, chills or sweats.  When he has cough is mostly in the mornings he wakes up feeling congestion which he points to his throat.  Office productive of mostly whitish sputum.  Does not occur again for the rest of the day.  He has been given albuterol inhaler he uses with mixed results.  Past medical history, surgical history and family history have been reviewed.  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  He has never smoked as noted above.  He has not done any [employer].   Review of Systems  Constitutional: Positive for unexpected weight change (Weight gain of over 20 pounds over the last year).  HENT: Positive for congestion and postnasal drip.   Eyes: Positive for redness and itching.  Respiratory: Positive for cough and shortness of breath.   Cardiovascular: Positive for leg swelling.  Gastrointestinal: Negative.   Endocrine: Negative.   Genitourinary: Negative.    Musculoskeletal: Negative.   Skin: Negative.   Allergic/Immunologic: Negative.   Neurological: Negative.   Hematological: Negative.   Psychiatric/Behavioral: Negative.   All other systems reviewed and are negative.      Objective:   Physical Exam Vitals signs and nursing note reviewed.  Constitutional:      Appearance: He is morbidly obese.     Comments: Plethoric appearing  HENT:     Head: Normocephalic and atraumatic.     Right Ear: Tympanic membrane, ear canal and external ear normal.     Left Ear: Tympanic membrane, ear canal and external ear normal.     Nose: Mucosal edema and congestion present.     Right Turbinates: Enlarged and swollen.     Left Turbinates: Enlarged and swollen.     Mouth/Throat:     Lips: Pink.     Mouth: Mucous membranes are moist.     Dentition: Dental caries present.     Tongue: No lesions.     Palate: No mass.     Pharynx: Oropharynx is clear. Uvula midline.     Comments: Mallampati III Neck:     Musculoskeletal: Normal range of motion.     Thyroid: No thyromegaly.     Trachea: Trachea and phonation normal.     Comments: Thick neck Cardiovascular:     Rate and Rhythm: Normal rate and regular rhythm.     Pulses:  Normal pulses.  Pulmonary:     Effort: Pulmonary effort is normal.     Breath sounds: Normal breath sounds. No wheezing or rhonchi.  Abdominal:     General: Abdomen is protuberant. There is no distension.  Musculoskeletal: Normal range of motion.     Right lower leg: No edema.     Left lower leg: No edema.  Lymphadenopathy:     Cervical: No cervical adenopathy.  Skin:    General: Skin is warm and dry.     Comments: Plethoric  Neurological:     General: No focal deficit present.     Mental Status: He is alert and oriented to person, place, and time.  Psychiatric:        Mood and Affect: Mood normal.        Behavior: Behavior normal.     No recent imaging studies     Assessment & Plan:   1.  Dyspnea: Possible  multiple etiologies.  It appears that he is having some issues with diastolic dysfunction of left ventricle and has significant obesity which could be leading to some obesity with obesity hypoventilation syndrome.  We will obtain pulmonary function testing to better characterize this issue.  We will also obtain chest x-ray.  He also has issues with upper airway congestion, he has evidence of significant rhinitis, this may be aggravating this issue.  2.  Perennial rhinitis with seasonal variation: We will give him a trial of Singulair 10 mg daily.  3.  Morbid obesity BMI of 41: This issue adds complexity to his management and may also be aggravating dyspnea sensation above.  Patient was counseled regards to weight loss.  4.  Struct of sleep apnea on CPAP: He appears to be well compensated in this regard.  Continue follow-up with his primary provider.   We will see the patient back in 2 months time.  He is to contact us prior to that time should any new difficulties arise.  Thank you for allowing of [place] to participate in this patient's care.  This chart was dictated using voice recognition software/Dragon.  Despite best efforts to proofread, errors can occur which can change the meaning.  Any change was purely unintentional.

## 2019-09-28 ENCOUNTER — Telehealth: Payer: Self-pay | Admitting: Pulmonary Disease

## 2019-09-28 NOTE — Telephone Encounter (Signed)
Left message for pt's spouse, Vaughan Basta (dpr).  Notes recorded by Tyler Pita, MD on 09/11/2019 at 10:31 AM EDT  Chest xray was normal.

## 2019-09-30 NOTE — Telephone Encounter (Signed)
Pt's spouse, Velva Harman is aware of results and voiced her understanding.  Nothing further is needed.

## 2019-10-19 ENCOUNTER — Telehealth: Payer: Self-pay | Admitting: Pulmonary Disease

## 2019-10-19 NOTE — Telephone Encounter (Signed)
Lm to relay date/time of covid test. 112/2020 prior to 11:[facility].

## 2019-10-20 ENCOUNTER — Other Ambulatory Visit: Payer: Self-pay

## 2019-10-20 NOTE — Telephone Encounter (Signed)
Pt's spouse, Linda(DPR) is aware of date/time of covid test.

## 2019-10-21 ENCOUNTER — Other Ambulatory Visit: Payer: Self-pay

## 2019-10-21 ENCOUNTER — Other Ambulatory Visit
Admission: RE | Admit: 2019-10-21 | Discharge: 2019-10-21 | Disposition: A | Discharge status: Home / Self Care | Payer: Medicare Other | Source: Ambulatory Visit | Attending: Pulmonary Disease | Admitting: Pulmonary Disease

## 2019-10-21 DIAGNOSIS — Z01812 Encounter for preprocedural laboratory examination: Secondary | ICD-10-CM | POA: Insufficient documentation

## 2019-10-21 DIAGNOSIS — Z20828 Contact with and (suspected) exposure to other viral communicable diseases: Secondary | ICD-10-CM | POA: Insufficient documentation

## 2019-10-21 LAB — SARS CORONAVIRUS 2 (TAT 6-24 HRS): SARS Coronavirus 2: NEGATIVE

## 2019-10-22 ENCOUNTER — Ambulatory Visit: Payer: Medicare Other | Attending: Pulmonary Disease

## 2019-10-22 DIAGNOSIS — R0600 Dyspnea, unspecified: Secondary | ICD-10-CM | POA: Diagnosis not present

## 2019-10-22 DIAGNOSIS — J984 Other disorders of lung: Secondary | ICD-10-CM | POA: Insufficient documentation

## 2019-10-22 MED ORDER — ALBUTEROL SULFATE (2.5 MG/3ML) 0.083% IN NEBU
2.5000 mg | INHALATION_SOLUTION | Freq: Once | RESPIRATORY_TRACT | Status: AC
  Administered 2019-10-22: 2.5 mg via RESPIRATORY_TRACT
  Filled 2019-10-22: qty 3

## 2019-11-02 ENCOUNTER — Ambulatory Visit: Payer: Medicare Other | Admitting: Pulmonary Disease

## 2019-11-02 ENCOUNTER — Other Ambulatory Visit: Payer: Self-pay

## 2019-11-02 ENCOUNTER — Encounter: Payer: Self-pay | Admitting: Pulmonary Disease

## 2019-11-02 VITALS — BP 148/88 | HR 66 | Temp 98.1°F | Ht 72.0 in | Wt 297.0 lb

## 2019-11-02 DIAGNOSIS — Z9989 Dependence on other enabling machines and devices: Secondary | ICD-10-CM

## 2019-11-02 DIAGNOSIS — Z6841 Body Mass Index (BMI) 40.0 and over, adult: Secondary | ICD-10-CM

## 2019-11-02 DIAGNOSIS — G4733 Obstructive sleep apnea (adult) (pediatric): Secondary | ICD-10-CM

## 2019-11-02 DIAGNOSIS — J3089 Other allergic rhinitis: Secondary | ICD-10-CM

## 2019-11-02 DIAGNOSIS — R0600 Dyspnea, unspecified: Secondary | ICD-10-CM | POA: Diagnosis not present

## 2019-11-02 DIAGNOSIS — J302 Other seasonal allergic rhinitis: Secondary | ICD-10-CM

## 2019-11-02 MED ORDER — BREO ELLIPTA 100-25 MCG/INH IN AEPB: 1.0000 | INHALATION_SPRAY | Freq: Every day | RESPIRATORY_TRACT | 0 refills | Status: AC

## 2019-11-02 MED ORDER — BREO ELLIPTA 100-25 MCG/INH IN AEPB: 1.0000 | INHALATION_SPRAY | Freq: Every day | RESPIRATORY_TRACT | 6 refills | Status: DC

## 2019-11-02 NOTE — Patient Instructions (Signed)
1.  We will discontinue Singulair (montelukast) as it did not help your symptoms  2.  Weight loss is highly recommended  3.  We will give you a trial of Breo Ellipta 1 inhalation daily  4.  Follow-up in 2 months time

## 2019-11-02 NOTE — Progress Notes (Signed)
Subjective:    Patient ID: Walter Anderson, male    DOB: 1946/09/24, 73 y.o.   MRN: [id number]  HPI Patient is a 73 year old morbidly obese lifelong never smoker presents for evaluation of dyspnea and upper airway congestion with seasonal variation.  We evaluate him for initially on 10 September 2019.  He has issues with obesity, he has been trying to lose weight, on his initial visit that he was 301 pounds today he is 297 pounds.  He has issues with diastolic dysfunction and a history of sleep apnea on CPAP he is unsure what pressure setting he is on.  He does note good sleep with the CPAP and is compliant.  His main issue is upper airway symptoms particularly post nasal discharge that collects in his throat and then he has to clear it.  He does note that this is worse in the mornings and then after it clears he has usually not much problems during the day.  He had pulmonary function testing performed on 22 October 2019 which were independently reviewed.  These showed mild restrictive physiology related to obesity (ERV 2%).  He had no evidence of obstruction and larger airways and diffusion capacity was 100%.  Flow volume loop was normal with perhaps some very mild component of mid flow obstruction.  We discussed his findings with the patient.  We discussed his current medication regimen he does not feel that montelukast helps him.  He has not had any fevers, chills or sweats he voices no other complaint.   Review of Systems A 10 point review of systems was performed and it is as noted above otherwise negative.    Objective:   Physical Exam BP (!) 148/88 (BP Location: Left Arm, Cuff Size: Normal)   Pulse 66   Temp 98.1 F (36.7 C) (Temporal)   Ht 6' (1.829 m)   Wt 297 lb (134.7 kg)   SpO2 98%   BMI 40.28 kg/m   GENERAL: Morbidly obese gentleman in no acute respiratory distress. HEAD: Normocephalic, atraumatic.  EYES: Pupils equal, round, reactive to light.  No scleral icterus.  MOUTH:  Nose/mouth/throat not examined due to masking requirements for COVID 19. NECK: Supple. No thyromegaly. No nodules. No JVD.  Trachea midline PULMONARY: Lungs with some coarse breath sounds in the upper lung zones otherwise, clear to auscultation bilaterally. CARDIOVASCULAR: S1 and S2. Regular rate and rhythm.  No rubs murmurs gallops heard GASTROINTESTINAL: Abdomen is protuberant, nondistended MUSCULOSKELETAL: No joint deformity, no clubbing, no edema.  NEUROLOGIC: No overt focal deficits, SKIN: Intact,warm,dry.  Seborrheic dermatitis of the face. PSYCH: Mood and behavior normal.     Assessment & Plan:   Dyspnea, multifactorial Main component is obesity, recommend weight loss Additional issues may be due to upper airway resistance from postnasal drip and upper airway congestion, nasal hygiene Trial of Breo Ellipta for minimal small airways component noted on PFTs Follow-up 2 months time call sooner should any new difficulties arise  Perennial allergic rhinitis with seasonal variation Nasal hygiene Discontinue montelukast as it is not helping him Consider ENT evaluation if symptoms persist  Obstructive sleep apnea on CPAP Patient compliant with CPAP Recommend that it moisture to CPAP if not already doing Patient aware that I do not manage sleep apnea as a general rule  Morbid obesity Recommend weight loss This issue adds complexity to his management vis--vis his dyspnea   C. Danice Goltz, MD [facility]  *This note was dictated using voice recognition software/Dragon.  Despite best efforts  to proofread, errors can occur which can change the meaning.  Any change was purely unintentional.

## 2019-12-11 ENCOUNTER — Ambulatory Visit: Payer: Medicare PPO | Attending: Internal Medicine

## 2019-12-11 DIAGNOSIS — Z23 Encounter for immunization: Secondary | ICD-10-CM | POA: Insufficient documentation

## 2019-12-11 NOTE — Progress Notes (Signed)
   Covid-19 Vaccination Clinic  Name:  Walter Anderson    MRN: [id number] DOB: August 03, 1946  12/11/2019  Mr. Walter Anderson was observed post Covid-19 immunization for 15 minutes without incidence. He was provided with Vaccine Information Sheet and instruction to access the V-Safe system.   Mr. Walter Anderson was instructed to call 911 with any severe reactions post vaccine: Marland Kitchen Difficulty breathing  . Swelling of your face and throat  . A fast heartbeat  . A bad rash all over your body  . Dizziness and weakness    Immunizations Administered    Name Date Dose VIS Date Route   Pfizer COVID-19 Vaccine 12/11/2019  1:42 PM 0.3 mL 10/30/2019 Intramuscular   Manufacturer: [employer], [employer]   Lot: [id number]   NDC: [id number]

## 2020-01-01 ENCOUNTER — Ambulatory Visit: Payer: Medicare PPO | Attending: Internal Medicine

## 2020-01-01 DIAGNOSIS — Z23 Encounter for immunization: Secondary | ICD-10-CM | POA: Insufficient documentation

## 2020-01-01 NOTE — Progress Notes (Signed)
   Covid-19 Vaccination Clinic  Name:  Walter Anderson    MRN: [id number] DOB: 05/09/1946  01/01/2020  [name] was observed post Covid-19 immunization for 15 minutes without incidence. He was provided with Vaccine Information Sheet and instruction to access the V-Safe system.   [name] was instructed to call 911 with any severe reactions post vaccine: Marland Kitchen Difficulty breathing  . Swelling of your face and throat  . A fast heartbeat  . A bad rash all over your body  . Dizziness and weakness    Immunizations Administered    Name Date Dose VIS Date Route   Pfizer COVID-19 Vaccine 01/01/2020 11:35 AM 0.3 mL 10/30/2019 Intramuscular   Manufacturer: [employer], [employer]   Lot: [id number]   NDC: [address]-7

## 2020-01-06 ENCOUNTER — Encounter: Payer: Self-pay | Admitting: Pulmonary Disease

## 2020-01-06 ENCOUNTER — Ambulatory Visit: Payer: Medicare PPO | Admitting: Pulmonary Disease

## 2020-01-06 ENCOUNTER — Other Ambulatory Visit: Payer: Self-pay

## 2020-01-06 VITALS — BP 136/64 | HR 61 | Temp 98.4°F | Ht 72.0 in | Wt 294.6 lb

## 2020-01-06 DIAGNOSIS — R0600 Dyspnea, unspecified: Secondary | ICD-10-CM

## 2020-01-06 DIAGNOSIS — E669 Obesity, unspecified: Secondary | ICD-10-CM | POA: Diagnosis not present

## 2020-01-06 DIAGNOSIS — R0982 Postnasal drip: Secondary | ICD-10-CM

## 2020-01-06 DIAGNOSIS — J302 Other seasonal allergic rhinitis: Secondary | ICD-10-CM

## 2020-01-06 DIAGNOSIS — J3089 Other allergic rhinitis: Secondary | ICD-10-CM | POA: Diagnosis not present

## 2020-01-06 NOTE — Patient Instructions (Signed)
Continue your weight loss efforts.  You are doing well.  We will see you in follow-up in 6 months time call sooner should any new difficulties arise.

## 2020-01-06 NOTE — Progress Notes (Signed)
Subjective:    Patient ID: Walter Anderson, male    DOB: 06/18/1946, 74 y.o.   MRN: [id number]  HPI Patient is a 74 year old morbidly obese lifelong never smoker who follows here for the issue of dyspnea and upper airway congestion.  We last saw him on 02 November 2019.  He has had a trial of Breo Ellipta which he finds may or may not help him.  This was for minimal mid flow obstruction noted on PFT.  The majority of his PFTs are consistent with restriction due to obesity.  He has continued on his weight loss efforts he is down to 294 pounds from 301 pounds on his original visit.  He has noted some improvement also with nasal hygiene and maintaining nasal congestion at bay.  He has a history of sleep apnea and is compliant with his CPAP.  During his last visit we discontinued montelukast as he felt this medication did not help him.  He has not noted any difficulties off of this medication.  Has had no fevers, chills or sweats.  Cough only occurs in the mornings to clear postnasal drip collected in the upper airway once he does this he is fine.  No discoloration.  No hemoptysis.  He has not had any chest pain, orthopnea, paroxysmal nocturnal dyspnea or lower extremity edema.  Today he feels well and looks well.     Review of Systems A 10 point review of systems was performed and it is as noted above otherwise negative.    Objective:   Physical Exam BP 136/64 (BP Location: Left Arm, Patient Position: Sitting, Cuff Size: Large)   Pulse 61   Temp 98.4 F (36.9 C) (Temporal)   Ht 6' (1.829 m)   Wt 294 lb 9.6 oz (133.6 kg)   SpO2 98% Comment: on ra  BMI 39.95 kg/m  GENERAL: Morbidly obese gentleman in no acute respiratory distress. HEAD: Normocephalic, atraumatic.  EYES: Pupils equal, round, reactive to light.  No scleral icterus.  MOUTH: Nose/mouth/throat not examined due to masking requirements for COVID 19. NECK: Supple. No thyromegaly. No nodules. No JVD.  Trachea midline PULMONARY: Lungs  with some coarse breath sounds in the upper lung zones otherwise, clear to auscultation bilaterally. CARDIOVASCULAR: S1 and S2. Regular rate and rhythm.  No rubs murmurs gallops heard GASTROINTESTINAL: Abdomen is protuberant, nondistended MUSCULOSKELETAL: No joint deformity, no clubbing, no edema.  NEUROLOGIC: No overt focal deficits, SKIN: Intact,warm,dry.  Seborrheic dermatitis of the face. PSYCH: Mood and behavior normal.  Exam unchanged from prior     Assessment & Plan:   Dyspnea, multifactorial  Slowly noticing improvements with weight loss Also noticing improvements with management of upper airway congestion Consider ENT evaluation if issues persist Has element of upper airway resistance due to chronic nasal congestion Continue Breo for now but consider discontinuation if no significant/dramatic change Follow-up in 6 months time call sooner should any new difficulties arise  Perennial rhinitis with seasonal variation Continue nasal hygiene as he is doing Consider ENT evaluation/allergy if symptoms persist  Obstructive sleep apnea on CPAP Continue CPAP use Patient states he is compliant Patient is aware that I do not manage sleep apnea as a general rule  Morbid obesity Patient commended on his weight loss efforts Continue weight loss This issue adds complexity to his management vis--vis his dyspnea  C. Danice Goltz, MD [facility]  *This note was dictated using voice recognition software/Dragon.  Despite best efforts to proofread, errors can occur which can change  the meaning.  Any change was purely unintentional.

## 2020-01-13 ENCOUNTER — Emergency Department: Payer: Medicare PPO

## 2020-01-13 ENCOUNTER — Other Ambulatory Visit: Payer: Self-pay | Admitting: Internal Medicine

## 2020-01-13 ENCOUNTER — Other Ambulatory Visit: Payer: Self-pay

## 2020-01-13 ENCOUNTER — Ambulatory Visit
Admission: RE | Admit: 2020-01-13 | Discharge: 2020-01-13 | Disposition: A | Discharge status: Home / Self Care | Payer: Medicare PPO | Source: Ambulatory Visit | Attending: Internal Medicine | Admitting: Internal Medicine

## 2020-01-13 ENCOUNTER — Inpatient Hospital Stay
Admission: EM | Admit: 2020-01-13 | Discharge: 2020-01-16 | DRG: 389 | Disposition: A | Discharge status: Home / Self Care | Payer: Medicare PPO | Source: Ambulatory Visit | Attending: Internal Medicine | Admitting: Internal Medicine

## 2020-01-13 DIAGNOSIS — K56609 Unspecified intestinal obstruction, unspecified as to partial versus complete obstruction: Secondary | ICD-10-CM | POA: Diagnosis not present

## 2020-01-13 DIAGNOSIS — Z79899 Other long term (current) drug therapy: Secondary | ICD-10-CM | POA: Diagnosis not present

## 2020-01-13 DIAGNOSIS — G4733 Obstructive sleep apnea (adult) (pediatric): Secondary | ICD-10-CM | POA: Diagnosis present

## 2020-01-13 DIAGNOSIS — Z9049 Acquired absence of other specified parts of digestive tract: Secondary | ICD-10-CM | POA: Diagnosis not present

## 2020-01-13 DIAGNOSIS — J4520 Mild intermittent asthma, uncomplicated: Secondary | ICD-10-CM

## 2020-01-13 DIAGNOSIS — Z9089 Acquired absence of other organs: Secondary | ICD-10-CM

## 2020-01-13 DIAGNOSIS — Z96641 Presence of right artificial hip joint: Secondary | ICD-10-CM | POA: Diagnosis present

## 2020-01-13 DIAGNOSIS — E860 Dehydration: Secondary | ICD-10-CM | POA: Diagnosis present

## 2020-01-13 DIAGNOSIS — I119 Hypertensive heart disease without heart failure: Secondary | ICD-10-CM | POA: Diagnosis present

## 2020-01-13 DIAGNOSIS — R1110 Vomiting, unspecified: Secondary | ICD-10-CM | POA: Diagnosis present

## 2020-01-13 DIAGNOSIS — I10 Essential (primary) hypertension: Secondary | ICD-10-CM

## 2020-01-13 DIAGNOSIS — Z833 Family history of diabetes mellitus: Secondary | ICD-10-CM | POA: Diagnosis not present

## 2020-01-13 DIAGNOSIS — Z8249 Family history of ischemic heart disease and other diseases of the circulatory system: Secondary | ICD-10-CM

## 2020-01-13 DIAGNOSIS — M109 Gout, unspecified: Secondary | ICD-10-CM | POA: Diagnosis present

## 2020-01-13 DIAGNOSIS — Z6841 Body Mass Index (BMI) 40.0 and over, adult: Secondary | ICD-10-CM

## 2020-01-13 DIAGNOSIS — K5732 Diverticulitis of large intestine without perforation or abscess without bleeding: Secondary | ICD-10-CM

## 2020-01-13 DIAGNOSIS — I700 Atherosclerosis of aorta: Secondary | ICD-10-CM | POA: Diagnosis present

## 2020-01-13 DIAGNOSIS — J45909 Unspecified asthma, uncomplicated: Secondary | ICD-10-CM | POA: Diagnosis present

## 2020-01-13 DIAGNOSIS — N179 Acute kidney failure, unspecified: Secondary | ICD-10-CM | POA: Diagnosis present

## 2020-01-13 DIAGNOSIS — Z825 Family history of asthma and other chronic lower respiratory diseases: Secondary | ICD-10-CM

## 2020-01-13 DIAGNOSIS — Z8719 Personal history of other diseases of the digestive system: Secondary | ICD-10-CM

## 2020-01-13 DIAGNOSIS — K56600 Partial intestinal obstruction, unspecified as to cause: Secondary | ICD-10-CM | POA: Diagnosis present

## 2020-01-13 DIAGNOSIS — Z20822 Contact with and (suspected) exposure to covid-19: Secondary | ICD-10-CM | POA: Diagnosis present

## 2020-01-13 DIAGNOSIS — Z87891 Personal history of nicotine dependence: Secondary | ICD-10-CM

## 2020-01-13 DIAGNOSIS — M1990 Unspecified osteoarthritis, unspecified site: Secondary | ICD-10-CM | POA: Diagnosis present

## 2020-01-13 HISTORY — DX: Unspecified asthma, uncomplicated: J45.909

## 2020-01-13 LAB — CBC WITH DIFFERENTIAL/PLATELET
Abs Immature Granulocytes: 0.03 10*3/uL (ref 0.00–0.07)
Basophils Absolute: 0 10*3/uL (ref 0.0–0.1)
Basophils Relative: 0 %
Eosinophils Absolute: 0 10*3/uL (ref 0.0–0.5)
Eosinophils Relative: 0 %
HCT: 48.6 % (ref 39.0–52.0)
Hemoglobin: 16.3 g/dL (ref 13.0–17.0)
Immature Granulocytes: 0 %
Lymphocytes Relative: 10 %
Lymphs Abs: 0.8 10*3/uL (ref 0.7–4.0)
MCH: 30.8 pg (ref 26.0–34.0)
MCHC: 33.5 g/dL (ref 30.0–36.0)
MCV: 91.9 fL (ref 80.0–100.0)
Monocytes Absolute: 0.8 10*3/uL (ref 0.1–1.0)
Monocytes Relative: 9 %
Neutro Abs: 7 10*3/uL (ref 1.7–7.7)
Neutrophils Relative %: 81 %
Platelets: 187 10*3/uL (ref 150–400)
RBC: 5.29 MIL/uL (ref 4.22–5.81)
RDW: 13 % (ref 11.5–15.5)
WBC: 8.6 10*3/uL (ref 4.0–10.5)
nRBC: 0 % (ref 0.0–0.2)

## 2020-01-13 LAB — COMPREHENSIVE METABOLIC PANEL
ALT: 23 U/L (ref 0–44)
AST: 18 U/L (ref 15–41)
Albumin: 4.3 g/dL (ref 3.5–5.0)
Alkaline Phosphatase: 61 U/L (ref 38–126)
Anion gap: 13 (ref 5–15)
BUN: 19 mg/dL (ref 8–23)
CO2: 19 mmol/L — ABNORMAL LOW (ref 22–32)
Calcium: 8.8 mg/dL — ABNORMAL LOW (ref 8.9–10.3)
Chloride: 103 mmol/L (ref 98–111)
Creatinine, Ser: 1.5 mg/dL — ABNORMAL HIGH (ref 0.61–1.24)
GFR calc Af Amer: 54 mL/min — ABNORMAL LOW (ref >60)
GFR calc non Af Amer: 46 mL/min — ABNORMAL LOW (ref >60)
Glucose, Bld: 120 mg/dL — ABNORMAL HIGH (ref 70–99)
Potassium: 3.9 mmol/L (ref 3.5–5.1)
Sodium: 135 mmol/L (ref 135–145)
Total Bilirubin: 1.2 mg/dL (ref 0.3–1.2)
Total Protein: 7.4 g/dL (ref 6.5–8.1)

## 2020-01-13 LAB — LACTIC ACID, PLASMA
Lactic Acid, Venous: 1 mmol/L (ref 0.5–1.9)
Lactic Acid, Venous: 1.1 mmol/L (ref 0.5–1.9)

## 2020-01-13 LAB — RESPIRATORY PANEL BY RT PCR (FLU A&B, COVID)
Influenza A by PCR: NEGATIVE
Influenza B by PCR: NEGATIVE
SARS Coronavirus 2 by RT PCR: NEGATIVE

## 2020-01-13 LAB — POCT I-STAT CREATININE: Creatinine, Ser: 1.7 mg/dL — ABNORMAL HIGH (ref 0.61–1.24)

## 2020-01-13 LAB — LIPASE, BLOOD: Lipase: 24 U/L (ref 11–51)

## 2020-01-13 MED ORDER — FLUTICASONE FUROATE-VILANTEROL 100-25 MCG/INH IN AEPB
1.0000 | INHALATION_SPRAY | Freq: Every day | RESPIRATORY_TRACT | Status: DC
  Administered 2020-01-13 – 2020-01-16 (×4): 1 via RESPIRATORY_TRACT
  Filled 2020-01-13: qty 28

## 2020-01-13 MED ORDER — BROMELAIN 250 MG PO CAPS: 240.0000 mg | ORAL_CAPSULE | Freq: Every day | ORAL | Status: DC

## 2020-01-13 MED ORDER — ENOXAPARIN SODIUM 40 MG/0.4ML ~~LOC~~ SOLN
40.0000 mg | Freq: Two times a day (BID) | SUBCUTANEOUS | Status: DC
  Administered 2020-01-13 – 2020-01-16 (×6): 40 mg via SUBCUTANEOUS
  Filled 2020-01-13 (×6): qty 0.4

## 2020-01-13 MED ORDER — FUROSEMIDE 40 MG PO TABS: 40.0000 mg | ORAL_TABLET | Freq: Two times a day (BID) | ORAL | Status: DC

## 2020-01-13 MED ORDER — OMEGA-3-ACID ETHYL ESTERS 1 G PO CAPS
2.0000 g | ORAL_CAPSULE | Freq: Every day | ORAL | Status: DC
  Administered 2020-01-15 – 2020-01-16 (×2): 2 g via ORAL
  Filled 2020-01-13 (×3): qty 2

## 2020-01-13 MED ORDER — LABETALOL HCL 5 MG/ML IV SOLN: 20.0000 mg | INTRAVENOUS | Status: DC | PRN

## 2020-01-13 MED ORDER — PANTOPRAZOLE SODIUM 40 MG IV SOLR
40.0000 mg | Freq: Two times a day (BID) | INTRAVENOUS | Status: DC
  Administered 2020-01-13 – 2020-01-16 (×6): 40 mg via INTRAVENOUS
  Filled 2020-01-13 (×6): qty 40

## 2020-01-13 MED ORDER — DOXAZOSIN MESYLATE 4 MG PO TABS
4.0000 mg | ORAL_TABLET | Freq: Two times a day (BID) | ORAL | Status: DC
  Administered 2020-01-13 – 2020-01-16 (×6): 4 mg via ORAL
  Filled 2020-01-13 (×7): qty 1

## 2020-01-13 MED ORDER — METOPROLOL SUCCINATE ER 100 MG PO TB24
100.0000 mg | ORAL_TABLET | Freq: Every day | ORAL | Status: DC
  Administered 2020-01-14 – 2020-01-16 (×3): 100 mg via ORAL
  Filled 2020-01-13 (×3): qty 1

## 2020-01-13 MED ORDER — ACETAMINOPHEN 650 MG RE SUPP: 650.0000 mg | Freq: Four times a day (QID) | RECTAL | Status: DC | PRN

## 2020-01-13 MED ORDER — SODIUM CHLORIDE 0.9 % IV SOLN: INTRAVENOUS | Status: DC

## 2020-01-13 MED ORDER — TRAZODONE HCL 50 MG PO TABS: 25.0000 mg | ORAL_TABLET | Freq: Every evening | ORAL | Status: DC | PRN

## 2020-01-13 MED ORDER — ACETAMINOPHEN 325 MG PO TABS
650.0000 mg | ORAL_TABLET | Freq: Four times a day (QID) | ORAL | Status: DC | PRN
  Administered 2020-01-15: 650 mg via ORAL
  Filled 2020-01-13: qty 2

## 2020-01-13 MED ORDER — ONDANSETRON HCL 4 MG/2ML IJ SOLN: 4.0000 mg | Freq: Four times a day (QID) | INTRAMUSCULAR | Status: DC | PRN

## 2020-01-13 MED ORDER — ALBUTEROL SULFATE (2.5 MG/3ML) 0.083% IN NEBU: 2.5000 mg | INHALATION_SOLUTION | Freq: Four times a day (QID) | RESPIRATORY_TRACT | Status: DC | PRN

## 2020-01-13 MED ORDER — AMLODIPINE BESYLATE 5 MG PO TABS
2.5000 mg | ORAL_TABLET | Freq: Every day | ORAL | Status: DC
  Administered 2020-01-14: 2.5 mg via ORAL
  Filled 2020-01-13: qty 1

## 2020-01-13 MED ORDER — TESTOSTERONE 50 MG/5GM (1%) TD GEL: 10.0000 g | Freq: Every day | TRANSDERMAL | Status: DC

## 2020-01-13 MED ORDER — IRBESARTAN 150 MG PO TABS: 150.0000 mg | ORAL_TABLET | Freq: Every day | ORAL | Status: DC

## 2020-01-13 MED ORDER — ALLOPURINOL 100 MG PO TABS
100.0000 mg | ORAL_TABLET | Freq: Every day | ORAL | Status: DC
  Administered 2020-01-14 – 2020-01-16 (×3): 100 mg via ORAL
  Filled 2020-01-13 (×3): qty 1

## 2020-01-13 MED ORDER — FERROUS SULFATE 325 (65 FE) MG PO TABS
325.0000 mg | ORAL_TABLET | Freq: Three times a day (TID) | ORAL | Status: DC
  Filled 2020-01-13: qty 1

## 2020-01-13 MED ORDER — ADULT MULTIVITAMIN W/MINERALS CH
1.0000 | ORAL_TABLET | Freq: Every day | ORAL | Status: DC
  Administered 2020-01-15 – 2020-01-16 (×2): 1 via ORAL
  Filled 2020-01-13 (×3): qty 1

## 2020-01-13 MED ORDER — IPRATROPIUM-ALBUTEROL 0.5-2.5 (3) MG/3ML IN SOLN: 3.0000 mL | Freq: Four times a day (QID) | RESPIRATORY_TRACT | Status: DC | PRN

## 2020-01-13 MED ORDER — IOHEXOL 300 MG/ML  SOLN
100.0000 mL | Freq: Once | INTRAMUSCULAR | Status: AC | PRN
  Administered 2020-01-13: 17:00:00 100 mL via INTRAVENOUS

## 2020-01-13 MED ORDER — POTASSIUM CHLORIDE CRYS ER 20 MEQ PO TBCR
20.0000 meq | EXTENDED_RELEASE_TABLET | Freq: Every day | ORAL | Status: DC
  Administered 2020-01-14: 20 meq via ORAL
  Filled 2020-01-13: qty 1

## 2020-01-13 MED ORDER — ONDANSETRON HCL 4 MG PO TABS: 4.0000 mg | ORAL_TABLET | Freq: Four times a day (QID) | ORAL | Status: DC | PRN

## 2020-01-13 MED ORDER — SODIUM CHLORIDE 0.9 % IV BOLUS
1000.0000 mL | Freq: Once | INTRAVENOUS | Status: AC
Start: 2020-01-13 — End: 2020-01-13
  Administered 2020-01-13: 1000 mL via INTRAVENOUS

## 2020-01-13 MED ORDER — SODIUM CHLORIDE 0.9 % IV SOLN: Freq: Once | INTRAVENOUS | Status: AC

## 2020-01-13 MED ORDER — COLCHICINE 0.6 MG PO TABS
1.2000 mg | ORAL_TABLET | Freq: Every day | ORAL | Status: DC | PRN
  Filled 2020-01-13: qty 2

## 2020-01-13 NOTE — ED Notes (Signed)
 noted at 1900 suctioned from NG Tube

## 2020-01-13 NOTE — ED Provider Notes (Signed)
[facility] Emergency Department Provider Note  ____________________________________________   None    (approximate)  I have reviewed the triage vital signs and the nursing notes.   HISTORY  Chief Complaint Bowel Obstruction    HPI Walter Anderson is a 74 y.o. male  With h/o HTN, asthma, morbid obesity, h/o cholecystitis s/p cholecystectomy, here with abd pain and distension. Pt has had progressively worsening abdominal pain, distension, and nausea. Sx began gradually 3 days ago as aching, diffuse, abd pain. Since then, he's had worsening distension with nausea, vomiting and inability to eat/drink. He also has had constipation, with passage of only a very small amount of stool intermittently. No fever, chills. He got an appt with Dr. Alice Reichert today who sent him for CT, which showed a high grade SBO. Subsequently brought to ED for evaluation. No other complaints.       Past Medical History:  Diagnosis Date  . Allergy   . Arthritis   . Asthma   . Hypertension   . Sleep apnea    uses CPAP    Patient Active Problem List   Diagnosis Date Noted  . 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 Cholecystitis, acute with cholelithiasis 11/16/2015    Past Surgical History:  Procedure Laterality Date  . BACK SURGERY  2006  . CHOLECYSTECTOMY N/A 11/15/2015   Procedure: LAPAROSCOPIC CHOLECYSTECTOMY WITH INTRAOPERATIVE CHOLANGIOGRAM;  Surgeon: Christene Lye, MD;  Location: [facility] ORS;  Service: General;  Laterality: N/A;  . FINGER ARTHROSCOPY  1990   3rd Finger Left Hand  . ROTATOR CUFF REPAIR     right shoulder  . TONSILLECTOMY  74 years old  . TOTAL HIP ARTHROPLASTY Right 04/01/2018   Procedure: RIGHT TOTAL HIP ARTHROPLASTY ANTERIOR APPROACH;  Surgeon: Paralee Cancel, MD;  Location: [facility] ORS;  Service: Orthopedics;  Laterality: Right;  70 mins  . UMBILICAL HERNIA REPAIR  11/15/2015   Procedure: HERNIA REPAIR UMBILICAL ADULT;   Surgeon: Christene Lye, MD;  Location: [facility] ORS;  Service: General;;    Prior to Admission medications   Medication Sig Start Date End Date Taking? Authorizing Provider  allopurinol (ZYLOPRIM) 100 MG tablet Take 100 mg by mouth daily.  12/16/19  Yes [provider]  amLODipine (NORVASC) 2.5 MG tablet Take 2.5 mg by mouth daily. 07/28/19  Yes [provider]  colchicine 0.6 MG tablet Take 1.2 mg by mouth daily as needed (gout).    Yes [provider]  doxazosin (CARDURA) 4 MG tablet Take 4 mg by mouth 2 (two) times daily.    Yes [provider]  fluticasone furoate-vilanterol (BREO ELLIPTA) 100-25 MCG/INH AEPB Inhale 1 puff into the lungs daily. 11/02/19  Yes Tyler Pita, MD  furosemide (LASIX) 40 MG tablet Take 40 mg by mouth 2 (two) times daily.    Yes [provider]  metoprolol succinate (TOPROL-XL) 100 MG 24 hr tablet Take 100 mg by mouth daily.  11/04/15  Yes [provider]  testosterone (ANDROGEL) 50 MG/5GM (1%) GEL Place 10 g onto the skin daily.    Yes [provider]  valsartan (DIOVAN) 320 MG tablet Take 320 mg by mouth daily.    Yes [provider]  albuterol (PROVENTIL HFA;VENTOLIN HFA) 108 (90 Base) MCG/ACT inhaler Inhale 1-2 puffs into the lungs every 6 (six) hours as needed for wheezing or shortness of breath.    [provider]  Bromelains (BROMELAIN PO) Take 240 mg by  mouth daily.    [provider]  Coenzyme Q10 400 MG CAPS Take 1 capsule by mouth daily.    [provider]  docusate sodium (COLACE) 100 MG capsule Take 1 capsule (100 mg total) by mouth 2 (two) times daily. 04/01/18   Lanney Gins, PA-C  ferrous sulfate (FERROUSUL) 325 (65 FE) MG tablet Take 1 tablet (325 mg total) by mouth 3 (three) times daily with meals. 04/01/18   Lanney Gins, PA-C  magnesium gluconate (MAGONATE) 500 MG tablet Take 500 mg by mouth 2 (two) times daily.    [provider]    Multiple Vitamin (MULTIVITAMIN WITH MINERALS) TABS tablet Take 1 tablet by mouth daily.    [provider]  niacin 500 MG tablet Take 500 mg by mouth at bedtime.    [provider]  omega-3 acid ethyl esters (LOVAZA) 1 g capsule Take 2 g by mouth daily.    [provider]  potassium chloride SA (K-DUR,KLOR-CON) 20 MEQ tablet Take 1 tablet by mouth daily. 11/10/15   [provider]  psyllium (HYDROCIL/METAMUCIL) 95 % PACK Take 2 packets by mouth daily.    [provider]  TURMERIC PO Take 750 mg by mouth daily.    [provider]    Allergies Patient has no known allergies.  Family History  Problem Relation Age of Onset  . Diabetes Mother   . COPD Father   . Heart disease Father     Social History Social History   Tobacco Use  . Smoking status: Never Smoker  . Smokeless tobacco: Former Neurosurgeon    Types: Chew  Substance Use Topics  . Alcohol use: Yes    Comment: 1-2 Weekly  . Drug use: No    Review of Systems  Review of Systems  Constitutional: Positive for fatigue. Negative for chills and fever.  HENT: Negative for sore throat.   Respiratory: Negative for shortness of breath.   Cardiovascular: Negative for chest pain.  Gastrointestinal: Positive for abdominal pain, constipation, nausea and vomiting.  Genitourinary: Negative for flank pain.  Musculoskeletal: Negative for neck pain.  Skin: Negative for rash and wound.  Allergic/Immunologic: Negative for immunocompromised state.  Neurological: Positive for weakness. Negative for numbness.  Hematological: Does not bruise/bleed easily.  All other systems reviewed and are negative.    ____________________________________________  PHYSICAL EXAM:      VITAL SIGNS: ED Triage Vitals  Enc Vitals Group     BP      Pulse      Resp      Temp      Temp src      SpO2      Weight      Height      Head Circumference      Peak Flow      Pain Score      Pain Loc       Pain Edu?      Excl. in GC?      Physical Exam Vitals and nursing note reviewed.  Constitutional:      General: He is not in acute distress.    Appearance: He is well-developed.  HENT:     Head: Normocephalic and atraumatic.  Eyes:     Conjunctiva/sclera: Conjunctivae normal.  Cardiovascular:     Rate and Rhythm: Regular rhythm. Tachycardia present.     Heart sounds: Normal heart sounds.  Pulmonary:     Effort: Pulmonary effort is normal. No respiratory distress.  Breath sounds: No wheezing.  Abdominal:     General: Abdomen is flat. Bowel sounds are decreased. There is distension.     Tenderness: There is abdominal tenderness.  Musculoskeletal:     Cervical back: Neck supple.  Skin:    General: Skin is warm.     Capillary Refill: Capillary refill takes less than 2 seconds.     Findings: No rash.  Neurological:     Mental Status: He is alert and oriented to person, place, and time.     Motor: No abnormal muscle tone.       ____________________________________________   LABS (all labs ordered are listed, but only abnormal results are displayed)  Labs Reviewed  COMPREHENSIVE METABOLIC PANEL - Abnormal; Notable for the following components:      Result Value   CO2 19 (*)    Glucose, Bld 120 (*)    Creatinine, Ser 1.50 (*)    Calcium 8.8 (*)    GFR calc non Af Amer 46 (*)    GFR calc Af Amer 54 (*)    All other components within normal limits  RESPIRATORY PANEL BY RT PCR (FLU A&B, COVID)  CBC WITH DIFFERENTIAL/PLATELET  LACTIC ACID, PLASMA  LIPASE, BLOOD  LACTIC ACID, PLASMA    ____________________________________________  EKG: None ________________________________________  RADIOLOGY All imaging, including plain films, CT scans, and ultrasounds, independently reviewed by me, and interpretations confirmed via formal radiology reads.  ED MD interpretation:   CT A/P W: High grade SBO, no definitive ischemia, mild mesenteric edema noted  Official  radiology report(s): CT ABDOMEN PELVIS W CONTRAST  Result Date: 01/13/2020 CLINICAL DATA:  Abdominal cramping and bloating for 4 days. Diarrhea. EXAM: CT ABDOMEN AND PELVIS WITH CONTRAST TECHNIQUE: Multidetector CT imaging of the abdomen and pelvis was performed using the standard protocol following bolus administration of intravenous contrast. CONTRAST:  OMNIPAQUE IOHEXOL 300 MG/ML  SOLN COMPARISON:  None. FINDINGS: Lower chest: Lung bases are clear. Atherosclerotic calcification of the aorta and aortic valve. Heart size normal. No pericardial or pleural effusion. Distal esophagus is unremarkable. Hepatobiliary: Liver is unremarkable. Cholecystectomy. No biliary ductal dilatation. Pancreas: Negative. Spleen: Negative. Adrenals/Urinary Tract: Adrenal glands are unremarkable. 2.0 cm low-attenuation lesion in the interpolar right kidney is likely a cyst. Left kidney is markedly atrophic and scarred. Ureters are decompressed. Bladder is low in volume and somewhat obscured by streak artifact from a right hip arthroplasty. Stomach/Bowel: Stomach and proximal and mid small bowel are distended with fluid and air. Suspected transition point in the mid abdomen (2/50 and 5/23) after which there is decompression to normal caliber small bowel. Small bowel wall attenuation is within normal limits. Appendix is unremarkable. Fluid is seen in the proximal colon. Colon is otherwise unremarkable. Vascular/Lymphatic: Atherosclerotic calcification of the aorta without aneurysm. No pathologically enlarged lymph nodes. Reproductive: Prostate is minimally prominent. Other: Scattered free fluid. Minimal mesenteric edema. No free air. Musculoskeletal: Right hip arthroplasty. Degenerative changes in the spine. No worrisome lytic or sclerotic lesions. IMPRESSION: 1. High-grade small bowel obstruction with transition point suspected in the mid small bowel. No definitive evidence of ischemia. Scattered free fluid and mild mesenteric  edema can be indicators of impending ischemia. Critical Value/emergent results were called by telephone at the time of interpretation on 01/13/2020 at 5:09 pm to provider [facility] NP, who verbally acknowledged these results. 2.  Aortic atherosclerosis (ICD10-I70.0). Electronically Signed   By: Leanna Battles M.D.   On: 01/13/2020 17:13    ____________________________________________  PROCEDURES  Procedure(s) performed (including Critical Care):  Procedures  ____________________________________________  INITIAL IMPRESSION / MDM / ASSESSMENT AND PLAN / ED COURSE  As part of my medical decision making, I reviewed the following data within the electronic MEDICAL RECORD NUMBER Nursing notes reviewed and incorporated, Old chart reviewed, Notes from prior ED visits, and [place] Controlled Substance Database       *ETHERIDGE GEIL was evaluated in Emergency Department on 01/13/2020 for the symptoms described in the history of present illness. He was evaluated in the context of the global COVID-19 pandemic, which necessitated consideration that the patient might be at risk for infection with the SARS-CoV-2 virus that causes COVID-19. Institutional protocols and algorithms that pertain to the evaluation of patients at risk for COVID-19 are in a state of rapid change based on information released by regulatory bodies including the CDC and federal and state organizations. These policies and algorithms were followed during the patient's care in the ED.  Some ED evaluations and interventions may be delayed as a result of limited staffing during the pandemic.*     Medical Decision Making:  74 yo M here with abd pain and distension 2/2 high grade SBO. Mild mesenteric edema noted on CT but LA normal, abd soft without guarding - do not suspect acute ischemia and pain is minimal. NGT placed. D/w Dr. Maia Plan. Will admit to medicine with surgical c/s. NGT, NPO,  fluids.  ____________________________________________  FINAL CLINICAL IMPRESSION(S) / ED DIAGNOSES  Final diagnoses:  Small bowel obstruction (HCC)     MEDICATIONS GIVEN DURING THIS VISIT:  Medications  sodium chloride 0.9 % bolus 1,000 mL (1,000 mLs Intravenous New Bag/Given 01/13/20 1827)  0.9 %  sodium chloride infusion ( Intravenous New Bag/Given 01/13/20 1826)     ED Discharge Orders    None       Note:  This document was prepared using Dragon voice recognition software and may include unintentional dictation errors.   Shaune Pollack, MD 01/13/20 [phone]

## 2020-01-13 NOTE — H&P (Addendum)
[place] [facility]   PATIENT NAME: Walter Anderson    MR#:  [id number]  DATE OF BIRTH:  Feb 18, 1946  DATE OF ADMISSION:  01/13/2020  PRIMARY CARE PHYSICIAN: Lonie Peak, PA-C   REQUESTING/REFERRING PHYSICIAN: Shaune Pollack, MD CHIEF COMPLAINT:   Chief Complaint  Patient presents with  . Bowel Obstruction  Intractable nausea and vomiting  HISTORY OF PRESENT ILLNESS:  Walter Anderson  is a 74 y.o. male with a known history of asthma, hypertension, obstructive sleep apnea on home CPAP, diastolic dysfunction and allergic rhinitis, who presented to the emergency room with acute onset of intractable vomiting since 3 PM on Sunday.  He was seen by Dr. Norma Fredrickson today and referred to the emergency room.  He denied any coffee-ground emesis or bilious vomitus.  He admitted to associated periumbilical abdominal pain with no fever or chills.  He has been having mild dyspnea without cough or wheezing or hemoptysis.  No chest pain or palpitations.  No dysuria, oliguria or hematuria or flank pain.  Upon presentation to the emergency room, blood pressure was 168/98 with respirate of 25 with otherwise normal vital signs.  Labs revealed a creatinine of 1.5 with a CO2 of 19 and serum lipase was 24 with lactic acid of 1 L 1.1 and unremarkable CBC.  His influenza antigens and COVID-19 PCR came back negative.  Abdominal and pelvic CT scan revealed 1. High-grade small bowel obstruction with transition point suspected in the mid small bowel. No definitive evidence of ischemia, scattered free fluid and mild mesenteric edema can be indicators of impending ischemia as well as aortic atherosclerosis. 1 view abdomen revealed enteric catheter overlying gastric body.  The patient was given 1 L bolus of IV normal saline followed by 100 mL/h.  General surgery consultation was obtained by Dr. Maia Plan and an NG tube was inserted with persistent yellowish aspirate.  He will be admitted to a medically monitored bed for  further evaluation and management. PAST MEDICAL HISTORY:   Past Medical History:  Diagnosis Date  . Allergy   . Arthritis   . Asthma   . Hypertension   . Sleep apnea    uses CPAP  Diastolic dysfunction Perineal rhinitis with seasonal variation Gout  PAST SURGICAL HISTORY:   Past Surgical History:  Procedure Laterality Date  . BACK SURGERY  2006  . CHOLECYSTECTOMY N/A 11/15/2015   Procedure: LAPAROSCOPIC CHOLECYSTECTOMY WITH INTRAOPERATIVE CHOLANGIOGRAM;  Surgeon: Kieth Brightly, MD;  Location: [facility] ORS;  Service: General;  Laterality: N/A;  . FINGER ARTHROSCOPY  1990   3rd Finger Left Hand  . ROTATOR CUFF REPAIR     right shoulder  . TONSILLECTOMY  74 years old  . TOTAL HIP ARTHROPLASTY Right 04/01/2018   Procedure: RIGHT TOTAL HIP ARTHROPLASTY ANTERIOR APPROACH;  Surgeon: Durene Romans, MD;  Location: [facility] ORS;  Service: Orthopedics;  Laterality: Right;  70 mins  . UMBILICAL HERNIA REPAIR  11/15/2015   Procedure: HERNIA REPAIR UMBILICAL ADULT;  Surgeon: Kieth Brightly, MD;  Location: [facility] ORS;  Service: General;;    SOCIAL HISTORY:   Social History   Tobacco Use  . Smoking status: Never Smoker  . Smokeless tobacco: Former Neurosurgeon    Types: Chew  Substance Use Topics  . Alcohol use: Yes    Comment: 1-2 Weekly    FAMILY HISTORY:   Family History  Problem Relation Age of Onset  . Diabetes Mother   . COPD Father   . Heart disease Father  DRUG ALLERGIES:  No Known Allergies  REVIEW OF SYSTEMS:   ROS As per history of present illness. All pertinent systems were reviewed above. Constitutional,  HEENT, cardiovascular, respiratory, GI, GU, musculoskeletal, neuro, psychiatric, endocrine,  integumentary and hematologic systems were reviewed and are otherwise  negative/unremarkable except for positive findings mentioned above in the HPI.   MEDICATIONS AT HOME:   Prior to Admission medications   Medication Sig Start Date End Date Taking?  Authorizing Provider  allopurinol (ZYLOPRIM) 100 MG tablet Take 100 mg by mouth daily.  12/16/19  Yes [provider]  amLODipine (NORVASC) 2.5 MG tablet Take 2.5 mg by mouth daily. 07/28/19  Yes [provider]  colchicine 0.6 MG tablet Take 1.2 mg by mouth daily as needed (gout).    Yes [provider]  doxazosin (CARDURA) 4 MG tablet Take 4 mg by mouth 2 (two) times daily.    Yes [provider]  fluticasone furoate-vilanterol (BREO ELLIPTA) 100-25 MCG/INH AEPB Inhale 1 puff into the lungs daily. 11/02/19  Yes Salena Saner, MD  furosemide (LASIX) 40 MG tablet Take 40 mg by mouth 2 (two) times daily.    Yes [provider]  metoprolol succinate (TOPROL-XL) 100 MG 24 hr tablet Take 100 mg by mouth daily.  11/04/15  Yes [provider]  testosterone (ANDROGEL) 50 MG/5GM (1%) GEL Place 10 g onto the skin daily.    Yes [provider]  valsartan (DIOVAN) 320 MG tablet Take 320 mg by mouth daily.    Yes [provider]  albuterol (PROVENTIL HFA;VENTOLIN HFA) 108 (90 Base) MCG/ACT inhaler Inhale 1-2 puffs into the lungs every 6 (six) hours as needed for wheezing or shortness of breath.    [provider]  Bromelains (BROMELAIN PO) Take 240 mg by mouth daily.    [provider]  Coenzyme Q10 400 MG CAPS Take 1 capsule by mouth daily.    [provider]  docusate sodium (COLACE) 100 MG capsule Take 1 capsule (100 mg total) by mouth 2 (two) times daily. 04/01/18   Lanney Gins, PA-C  ferrous sulfate (FERROUSUL) 325 (65 FE) MG tablet Take 1 tablet (325 mg total) by mouth 3 (three) times daily with meals. 04/01/18   Lanney Gins, PA-C  magnesium gluconate (MAGONATE) 500 MG tablet Take 500 mg by mouth 2 (two) times daily.    [provider]  Multiple Vitamin (MULTIVITAMIN WITH MINERALS) TABS tablet Take 1 tablet by mouth daily.    [provider]  niacin 500 MG tablet Take 500 mg by  mouth at bedtime.    [provider]  omega-3 acid ethyl esters (LOVAZA) 1 g capsule Take 2 g by mouth daily.    [provider]  potassium chloride SA (K-DUR,KLOR-CON) 20 MEQ tablet Take 1 tablet by mouth daily. 11/10/15   [provider]  psyllium (HYDROCIL/METAMUCIL) 95 % PACK Take 2 packets by mouth daily.    [provider]  TURMERIC PO Take 750 mg by mouth daily.    [provider]      VITAL SIGNS:  Blood pressure 135/85, pulse 94, temperature 98.3 F (36.8 C), temperature source Oral, resp. rate 20, height 6' (1.829 m), weight 134.5 kg, SpO2 94 %.  PHYSICAL EXAMINATION:  Physical Exam  GENERAL:  74 y.o.-year-old obese Caucasian male patient lying in the bed with no acute distress.  EYES: Pupils equal, round, reactive to light and accommodation. No scleral icterus. Extraocular muscles intact.  HEENT: Head atraumatic,  normocephalic. Oropharynx and nasopharynx clear.  NECK:  Supple, no jugular venous distention. No thyroid enlargement, no tenderness.  LUNGS: Normal breath sounds bilaterally, no wheezing, rales,rhonchi or crepitation. No use of accessory muscles of respiration.  CARDIOVASCULAR: Regular rate and rhythm, S1, S2 normal. No murmurs, rubs, or gallops.  ABDOMEN: Soft, nondistended with mild peri-umbilical tenderness without rebound tenderness guarding or rigidity.  He had tympanic bowel sounds.  No organomegaly or mass.  EXTREMITIES: No pedal edema, cyanosis, or clubbing.  NEUROLOGIC: Cranial nerves II through XII are intact. Muscle strength 5/5 in all extremities. Sensation intact. Gait not checked.  PSYCHIATRIC: The patient is alert and oriented x 3.  Normal affect and good eye contact. SKIN: No obvious rash, lesion, or ulcer.   LABORATORY PANEL:   CBC Recent Labs  Lab 01/13/20 1823  WBC 8.6  HGB 16.3  HCT 48.6  PLT 187    ------------------------------------------------------------------------------------------------------------------  Chemistries  Recent Labs  Lab 01/13/20 1823  NA 135  K 3.9  CL 103  CO2 19*  GLUCOSE 120*  BUN 19  CREATININE 1.50*  CALCIUM 8.8*  AST 18  ALT 23  ALKPHOS 61  BILITOT 1.2   ------------------------------------------------------------------------------------------------------------------  Cardiac Enzymes No results for input(s): TROPONINI in the last 168 hours. ------------------------------------------------------------------------------------------------------------------  RADIOLOGY:  DG Abdomen 1 View  Result Date: 01/13/2020 CLINICAL DATA:  Enteric catheter placement EXAM: ABDOMEN - 1 VIEW COMPARISON:  01/13/2020 FINDINGS: Frontal view of the lower chest and upper abdomen demonstrates enteric catheter tip projecting over gastric body. Distended gas-filled loops of small bowel consistent with small-bowel obstruction unchanged. Lung bases are clear. IMPRESSION: 1. Enteric catheter overlying gastric body. Electronically Signed   By: Sharlet Salina M.D.   On: 01/13/2020 19:31   CT ABDOMEN PELVIS W CONTRAST  Result Date: 01/13/2020 CLINICAL DATA:  Abdominal cramping and bloating for 4 days. Diarrhea. EXAM: CT ABDOMEN AND PELVIS WITH CONTRAST TECHNIQUE: Multidetector CT imaging of the abdomen and pelvis was performed using the standard protocol following bolus administration of intravenous contrast. CONTRAST:  OMNIPAQUE IOHEXOL 300 MG/ML  SOLN COMPARISON:  None. FINDINGS: Lower chest: Lung bases are clear. Atherosclerotic calcification of the aorta and aortic valve. Heart size normal. No pericardial or pleural effusion. Distal esophagus is unremarkable. Hepatobiliary: Liver is unremarkable. Cholecystectomy. No biliary ductal dilatation. Pancreas: Negative. Spleen: Negative. Adrenals/Urinary Tract: Adrenal glands are unremarkable. 2.0 cm low-attenuation lesion in  the interpolar right kidney is likely a cyst. Left kidney is markedly atrophic and scarred. Ureters are decompressed. Bladder is low in volume and somewhat obscured by streak artifact from a right hip arthroplasty. Stomach/Bowel: Stomach and proximal and mid small bowel are distended with fluid and air. Suspected transition point in the mid abdomen (2/50 and 5/23) after which there is decompression to normal caliber small bowel. Small bowel wall attenuation is within normal limits. Appendix is unremarkable. Fluid is seen in the proximal colon. Colon is otherwise unremarkable. Vascular/Lymphatic: Atherosclerotic calcification of the aorta without aneurysm. No pathologically enlarged lymph nodes. Reproductive: Prostate is minimally prominent. Other: Scattered free fluid. Minimal mesenteric edema. No free air. Musculoskeletal: Right hip arthroplasty. Degenerative changes in the spine. No worrisome lytic or sclerotic lesions. IMPRESSION: 1. High-grade small bowel obstruction with transition point suspected in the mid small bowel. No definitive evidence of ischemia. Scattered free fluid and mild mesenteric edema can be indicators of impending ischemia. Critical Value/emergent results were called by telephone at the time of interpretation on 01/13/2020 at 5:09 pm to provider [facility] NP, who  verbally acknowledged these results. 2.  Aortic atherosclerosis (ICD10-I70.0). Electronically Signed   By: Lorin Picket M.D.   On: 01/13/2020 17:13      IMPRESSION AND PLAN:   1.  High-grade small bowel obstruction. -This likely secondary to adhesions from previous laparoscopic cholecystectomy and umbilical herniorrhaphy. -The patient will be admitted to a medically monitored bed. -He will be kept n.p.o.  We will maintain NG tube to low suction. -A general surgery consultation will was obtained by Dr. Peyton Najjar who will be planning a Gastrografin study tomorrow and will follow with [place].  2.  Acute kidney  injury. -This likely prerenal secondary to volume depletion and dehydration. -We will place on hydration with IV normal saline and hold off his Lasix and ARB.  3.  Hypertension, currently uncontrolled. -The patient will be placed on as needed IV labetalol. -We will continue his antihypertensives with temporary clamping of his NG tube only as tolerated while holding ARB and Lasix given acute kidney injury.  4.  Asthma with no current exacerbation. -We will place him on as needed duo nebs and continue his Breo and albuterol MDI.  5.  Gout. -We will continue allopurinol.  6.  Obstructive sleep apnea. -We will continue CPAP nightly.  7.  DVT prophylaxis. -Subcutaneous Lovenox.  8.  GI prophylaxis. -IV PPI therapy with Protonix.   All the records are reviewed and case discussed with ED provider. The plan of care was discussed in details with the patient (and family). I answered all questions. The patient agreed to proceed with the above mentioned plan. Further management will depend upon hospital course.   CODE STATUS: Full code  TOTAL TIME TAKING CARE OF THIS PATIENT: 55 minutes.    Christel Mormon M.D on 01/13/2020 at 8:19 PM  [facility]   From 7 PM-7 AM, contact night-coverage [email or web address]  CC: Primary care physician; Cyndi Bender, PA-C   Note: This dictation was prepared with Dragon dictation along with smaller phrase technology. Any transcriptional errors that result from this process are unintentional.

## 2020-01-13 NOTE — Plan of Care (Signed)
  Problem: Education: Goal: Knowledge of General Education information will improve Description: Including pain rating scale, medication(s)/side effects and non-pharmacologic comfort measures Outcome: Progressing   Problem: Clinical Measurements: Goal: Ability to maintain clinical measurements within normal limits will improve Outcome: Progressing Goal: Will remain free from infection Outcome: Progressing Goal: Diagnostic test results will improve Outcome: Progressing   Problem: Activity: Goal: Risk for activity intolerance will decrease Outcome: Progressing   Problem: Nutrition: Goal: Adequate nutrition will be maintained Outcome: Progressing   Problem: Coping: Goal: Level of anxiety will decrease Outcome: Progressing   Problem: Nutrition: Goal: Adequate nutrition will be maintained Outcome: Progressing   Problem: Coping: Goal: Level of anxiety will decrease Outcome: Progressing

## 2020-01-13 NOTE — ED Triage Notes (Signed)
Patient brought over from CT after concerns of Bowel Obstruction and possible necrosis. Patient arrived via POV, from home, AOx4 and ambulatory.  20G Left AC

## 2020-01-13 NOTE — Consult Note (Signed)
SURGICAL CONSULTATION NOTE   HISTORY OF PRESENT ILLNESS (HPI):  74 y.o. male presented to [facility] ED for evaluation of abdominal pain since 4 days ago. Patient reports he ate a lots of peanut on Sunday. Since then he has been feeling with lower abdominal pain. Pain does not radiates to other part of the body. There has been no other aggravating factor. There is no alleviating factor. Yesterday developed nausea and vomiting. He thought that it was diverticulitis and went to see a Solicitor. GI service ordered labs and CT scan of the abdomen. Labs done at [facility] shows no leukocytosis. CT scan shows small bowel dilation with transition point at distal ileum. There is no free air. I personally evaluated the images. Patient denies fever or chills.   Surgery is consulted by Dr. Arville Care in this context for evaluation and management of small bowel obstruction.  PAST MEDICAL HISTORY (PMH):  Past Medical History:  Diagnosis Date  . Allergy   . Arthritis   . Asthma   . Hypertension   . Sleep apnea    uses CPAP     PAST SURGICAL HISTORY (PSH):  Past Surgical History:  Procedure Laterality Date  . BACK SURGERY  2006  . CHOLECYSTECTOMY N/A 11/15/2015   Procedure: LAPAROSCOPIC CHOLECYSTECTOMY WITH INTRAOPERATIVE CHOLANGIOGRAM;  Surgeon: Kieth Brightly, MD;  Location: [facility] ORS;  Service: General;  Laterality: N/A;  . FINGER ARTHROSCOPY  1990   3rd Finger Left Hand  . ROTATOR CUFF REPAIR     right shoulder  . TONSILLECTOMY  74 years old  . TOTAL HIP ARTHROPLASTY Right 04/01/2018   Procedure: RIGHT TOTAL HIP ARTHROPLASTY ANTERIOR APPROACH;  Surgeon: Durene Romans, MD;  Location: [facility] ORS;  Service: Orthopedics;  Laterality: Right;  70 mins  . UMBILICAL HERNIA REPAIR  11/15/2015   Procedure: HERNIA REPAIR UMBILICAL ADULT;  Surgeon: Kieth Brightly, MD;  Location: [facility] ORS;  Service: General;;     MEDICATIONS:  Prior to Admission medications   Medication Sig Start Date End Date Taking?  Authorizing Provider  allopurinol (ZYLOPRIM) 100 MG tablet Take 100 mg by mouth daily.  12/16/19  Yes [provider]  amLODipine (NORVASC) 2.5 MG tablet Take 2.5 mg by mouth daily. 07/28/19  Yes [provider]  colchicine 0.6 MG tablet Take 1.2 mg by mouth daily as needed (gout).    Yes [provider]  doxazosin (CARDURA) 4 MG tablet Take 4 mg by mouth 2 (two) times daily.    Yes [provider]  fluticasone furoate-vilanterol (BREO ELLIPTA) 100-25 MCG/INH AEPB Inhale 1 puff into the lungs daily. 11/02/19  Yes Salena Saner, MD  furosemide (LASIX) 40 MG tablet Take 40 mg by mouth 2 (two) times daily.    Yes [provider]  metoprolol succinate (TOPROL-XL) 100 MG 24 hr tablet Take 100 mg by mouth daily.  11/04/15  Yes [provider]  testosterone (ANDROGEL) 50 MG/5GM (1%) GEL Place 10 g onto the skin daily.    Yes [provider]  valsartan (DIOVAN) 320 MG tablet Take 320 mg by mouth daily.    Yes [provider]  albuterol (PROVENTIL HFA;VENTOLIN HFA) 108 (90 Base) MCG/ACT inhaler Inhale 1-2 puffs into the lungs every 6 (six) hours as needed for wheezing or shortness of breath.    [provider]  Bromelains (BROMELAIN PO) Take 240 mg by mouth daily.    [provider]  Coenzyme Q10 400 MG CAPS Take 1 capsule by mouth daily.  [provider]  docusate sodium (COLACE) 100 MG capsule Take 1 capsule (100 mg total) by mouth 2 (two) times daily. 04/01/18   Lanney Gins, PA-C  ferrous sulfate (FERROUSUL) 325 (65 FE) MG tablet Take 1 tablet (325 mg total) by mouth 3 (three) times daily with meals. 04/01/18   Lanney Gins, PA-C  magnesium gluconate (MAGONATE) 500 MG tablet Take 500 mg by mouth 2 (two) times daily.    [provider]  Multiple Vitamin (MULTIVITAMIN WITH MINERALS) TABS tablet Take 1 tablet by mouth daily.    [provider]  niacin 500 MG tablet Take 500 mg by  mouth at bedtime.    [provider]  omega-3 acid ethyl esters (LOVAZA) 1 g capsule Take 2 g by mouth daily.    [provider]  potassium chloride SA (K-DUR,KLOR-CON) 20 MEQ tablet Take 1 tablet by mouth daily. 11/10/15   [provider]  psyllium (HYDROCIL/METAMUCIL) 95 % PACK Take 2 packets by mouth daily.    [provider]  TURMERIC PO Take 750 mg by mouth daily.    [provider]     ALLERGIES:  No Known Allergies   SOCIAL HISTORY:  Social History   Socioeconomic History  . Marital status: Divorced    Spouse name: Not on file  . Number of children: Not on file  . Years of education: Not on file  . Highest education level: Not on file  Occupational History  . Not on file  Tobacco Use  . Smoking status: Never Smoker  . Smokeless tobacco: Former Neurosurgeon    Types: Chew  Substance and Sexual Activity  . Alcohol use: Yes    Comment: 1-2 Weekly  . Drug use: No  . Sexual activity: Not Currently  Other Topics Concern  . Not on file  Social History Narrative  . Not on file   Social Determinants of Health   Financial Resource Strain:   . Difficulty of Paying Living Expenses: Not on file  Food Insecurity:   . Worried About Programme researcher, broadcasting/film/video in the Last Year: Not on file  . Ran Out of Food in the Last Year: Not on file  Transportation Needs:   . Lack of Transportation (Medical): Not on file  . Lack of Transportation (Non-Medical): Not on file  Physical Activity:   . Days of Exercise per Week: Not on file  . Minutes of Exercise per Session: Not on file  Stress:   . Feeling of Stress : Not on file  Social Connections:   . Frequency of Communication with Friends and Family: Not on file  . Frequency of Social Gatherings with Friends and Family: Not on file  . Attends Religious Services: Not on file  . Active Member of Clubs or Organizations: Not on file  . Attends Banker Meetings: Not on file  . Marital Status:  Not on file  Intimate Partner Violence:   . Fear of Current or Ex-Partner: Not on file  . Emotionally Abused: Not on file  . Physically Abused: Not on file  . Sexually Abused: Not on file    The patient currently resides (home / rehab facility / nursing home): Home The patient normally is (ambulatory / bedbound): Ambulatory   FAMILY HISTORY:  Family History  Problem Relation Age of Onset  . Diabetes Mother   . COPD Father   . Heart disease Father      REVIEW OF SYSTEMS:  Constitutional: denies weight loss,  fever, chills, or sweats  Eyes: denies any other vision changes, history of eye injury  ENT: denies sore throat, hearing problems  Respiratory: denies shortness of breath, wheezing  Cardiovascular: denies chest pain, palpitations  Gastrointestinal: positive abdominal pain, N/V Genitourinary: denies burning with urination or urinary frequency Musculoskeletal: denies any other joint pains or cramps  Skin: denies any other rashes or skin discolorations  Neurological: denies any other headache, dizziness, weakness  Psychiatric: denies any other depression, anxiety   All other review of systems were negative   VITAL SIGNS:  Temp:  [98.3 F (36.8 C)] 98.3 F (36.8 C) (02/24 1800) Pulse Rate:  [94-99] 94 (02/24 2000) Resp:  [20-25] 20 (02/24 2000) BP: (135-168)/(85-98) 135/85 (02/24 2000) SpO2:  [94 %-97 %] 94 % (02/24 2000) Weight:  [134.5 kg] 134.5 kg (02/24 1744)     Height: 6' (182.9 cm) Weight: 134.5 kg BMI (Calculated): 40.21   INTAKE/OUTPUT:  This shift: No intake/output data recorded.  Last 2 shifts: @IOLAST2SHIFTS @   PHYSICAL EXAM:  Constitutional:  -- Normal body habitus  -- Awake, alert, and oriented x3  Eyes:  -- Pupils equally round and reactive to light  -- No scleral icterus  Ear, nose, and throat:  -- No jugular venous distension  Pulmonary:  -- No crackles  -- Equal breath sounds bilaterally -- Breathing non-labored at rest Cardiovascular:  --  S1, S2 present  -- No pericardial rubs Gastrointestinal:  -- Abdomen soft, nontender, distended and tympanic, no guarding or rebound tenderness -- No abdominal masses appreciated, pulsatile or otherwise  Musculoskeletal and Integumentary:  -- Wounds or skin discoloration: None appreciated -- Extremities: B/L UE and LE FROM, hands and feet warm, no edema  Neurologic:  -- Motor function: intact and symmetric -- Sensation: intact and symmetric   Labs:  CBC Latest Ref Rng & Units 01/13/2020 04/02/2018 03/24/2018  WBC 4.0 - 10.5 K/uL 8.6 16.2(H) 7.6  Hemoglobin 13.0 - 17.0 g/dL 16.3 14.3 16.4  Hematocrit 39.0 - 52.0 % 48.6 42.2 48.8  Platelets 150 - 400 K/uL 187 139(L) 122(L)   CMP Latest Ref Rng & Units 01/13/2020 01/13/2020 04/02/2018  Glucose 70 - 99 mg/dL 120(H) - 146(H)  BUN 8 - 23 mg/dL 19 - 16  Creatinine 0.61 - 1.24 mg/dL 1.50(H) 1.70(H) 0.98  Sodium 135 - 145 mmol/L 135 - 136  Potassium 3.5 - 5.1 mmol/L 3.9 - 4.5  Chloride 98 - 111 mmol/L 103 - 106  CO2 22 - 32 mmol/L 19(L) - 22  Calcium 8.9 - 10.3 mg/dL 8.8(L) - 8.3(L)  Total Protein 6.5 - 8.1 g/dL 7.4 - -  Total Bilirubin 0.3 - 1.2 mg/dL 1.2 - -  Alkaline Phos 38 - 126 U/L 61 - -  AST 15 - 41 U/L 18 - -  ALT 0 - 44 U/L 23 - -    Imaging studies:  EXAM: CT ABDOMEN AND PELVIS WITH CONTRAST  TECHNIQUE: Multidetector CT imaging of the abdomen and pelvis was performed using the standard protocol following bolus administration of intravenous contrast.  CONTRAST:  130mL OMNIPAQUE IOHEXOL 300 MG/ML  SOLN  COMPARISON:  None.  FINDINGS: Lower chest: Lung bases are clear. Atherosclerotic calcification of the aorta and aortic valve. Heart size normal. No pericardial or pleural effusion. Distal esophagus is unremarkable.  Hepatobiliary: Liver is unremarkable. Cholecystectomy. No biliary ductal dilatation.  Pancreas: Negative.  Spleen: Negative.  Adrenals/Urinary Tract: Adrenal glands are unremarkable. 2.0  cm low-attenuation lesion in the interpolar right kidney is likely a cyst.  Left kidney is markedly atrophic and scarred. Ureters are decompressed. Bladder is low in volume and somewhat obscured by streak artifact from a right hip arthroplasty.  Stomach/Bowel: Stomach and proximal and mid small bowel are distended with fluid and air. Suspected transition point in the mid abdomen (2/50 and 5/23) after which there is decompression to normal caliber small bowel. Small bowel wall attenuation is within normal limits. Appendix is unremarkable. Fluid is seen in the proximal colon. Colon is otherwise unremarkable.  Vascular/Lymphatic: Atherosclerotic calcification of the aorta without aneurysm. No pathologically enlarged lymph nodes.  Reproductive: Prostate is minimally prominent.  Other: Scattered free fluid. Minimal mesenteric edema. No free air.  Musculoskeletal: Right hip arthroplasty. Degenerative changes in the spine. No worrisome lytic or sclerotic lesions.  IMPRESSION: 1. High-grade small bowel obstruction with transition point suspected in the mid small bowel. No definitive evidence of ischemia. Scattered free fluid and mild mesenteric edema can be indicators of impending ischemia. Critical Value/emergent results were called by telephone at the time of interpretation on 01/13/2020 at 5:09 pm to provider [facility] NP, who verbally acknowledged these results. 2.  Aortic atherosclerosis (ICD10-I70.0).   Electronically Signed   By: Leanna Battles M.D.   On: 01/13/2020 17:13  Assessment/Plan:  74 y.o. male with small bowel obstruction, complicated by pertinent comorbidities including asthma, Sleep Apnea and Hypertension.  Patient with clinical and imaging consisted with small bowel obstruction most likely due to adhesions. Patient with history of lap cholecystectomy and umbilical hernia repair. At the moment of my evaluation there is no indication for surgical  management. Abdomen is soft and depressible, no guarding or any sign of acute abdomen. WBC count normal, no acidosis on labs done earlier at [facility].  This is patient first episode of bowel obstruction. Agree with NGT placement. I will order gastrografin study tomorrow after adequate decompression of stomach overnight. Agree with IVF's for hydration and bowel rest. I will follow patient closely with physical exam, vitals and images.   Gae Gallop, MD

## 2020-01-14 ENCOUNTER — Inpatient Hospital Stay: Payer: Medicare PPO

## 2020-01-14 DIAGNOSIS — K56609 Unspecified intestinal obstruction, unspecified as to partial versus complete obstruction: Secondary | ICD-10-CM

## 2020-01-14 LAB — BASIC METABOLIC PANEL
Anion gap: 10 (ref 5–15)
BUN: 18 mg/dL (ref 8–23)
CO2: 22 mmol/L (ref 22–32)
Calcium: 8.5 mg/dL — ABNORMAL LOW (ref 8.9–10.3)
Chloride: 108 mmol/L (ref 98–111)
Creatinine, Ser: 1.32 mg/dL — ABNORMAL HIGH (ref 0.61–1.24)
GFR calc Af Amer: >60 mL/min (ref >60)
GFR calc non Af Amer: 54 mL/min — ABNORMAL LOW (ref >60)
Glucose, Bld: 96 mg/dL (ref 70–99)
Potassium: 3.8 mmol/L (ref 3.5–5.1)
Sodium: 140 mmol/L (ref 135–145)

## 2020-01-14 LAB — CBC
HCT: 45.1 % (ref 39.0–52.0)
Hemoglobin: 14.9 g/dL (ref 13.0–17.0)
MCH: 30.9 pg (ref 26.0–34.0)
MCHC: 33 g/dL (ref 30.0–36.0)
MCV: 93.6 fL (ref 80.0–100.0)
Platelets: 179 10*3/uL (ref 150–400)
RBC: 4.82 MIL/uL (ref 4.22–5.81)
RDW: 13.1 % (ref 11.5–15.5)
WBC: 7.1 10*3/uL (ref 4.0–10.5)
nRBC: 0 % (ref 0.0–0.2)

## 2020-01-14 MED ORDER — POTASSIUM CHLORIDE 20 MEQ/15ML (10%) PO SOLN
20.0000 meq | Freq: Every day | ORAL | Status: DC
  Administered 2020-01-15 – 2020-01-16 (×2): 20 meq via ORAL
  Filled 2020-01-14 (×2): qty 15

## 2020-01-14 MED ORDER — FERROUS SULFATE 75 (15 FE) MG/ML PO SOLN
65.0000 mg | Freq: Three times a day (TID) | ORAL | Status: DC
  Filled 2020-01-14: qty 4.3

## 2020-01-14 MED ORDER — PHENOL 1.4 % MT LIQD
1.0000 | OROMUCOSAL | Status: DC | PRN
  Administered 2020-01-14 (×3): 1 via OROMUCOSAL
  Filled 2020-01-14: qty 177

## 2020-01-14 MED ORDER — DIATRIZOATE MEGLUMINE & SODIUM 66-10 % PO SOLN
90.0000 mL | Freq: Once | ORAL | Status: AC
  Administered 2020-01-14: 09:00:00 90 mL via NASOGASTRIC

## 2020-01-14 NOTE — Progress Notes (Signed)
SURGICAL PROGRESS NOTE   Hospital Day(s): 1.   Post op day(s):  Marland Kitchen   Interval History: Patient seen and examined, no acute events or new complaints overnight. Patient reports feeling much better today. Feels more comfortable. No abdominal pain. No pain radiation. Alleviating factor is NGT decompression. No aggravating factors. Reports passed a small gas. Denies nausea or vomiting.    Vital signs in last 24 hours: [min-max] current  Temp:  [97.7 F (36.5 C)-98.5 F (36.9 C)] 98.5 F (36.9 C) (02/25 1310) Pulse Rate:  [80-101] 80 (02/25 1310) Resp:  [16-23] 16 (02/25 1310) BP: (125-166)/(72-97) 125/74 (02/25 1310) SpO2:  [94 %-98 %] 94 % (02/25 1310) Weight:  [130.1 kg] 130.1 kg (02/24 2103)     Height: 5\' 9"  (175.3 cm) Weight: 130.1 kg BMI (Calculated): 42.34   NGT:  600 mL in last 24 hours.   Physical Exam:  Constitutional: alert, cooperative and no distress  Respiratory: breathing non-labored at rest  Cardiovascular: regular rate and sinus rhythm  Gastrointestinal: soft, non-tender, distended and tympanic. No guarding   Labs:  CBC Latest Ref Rng & Units 01/14/2020 01/13/2020 04/02/2018  WBC 4.0 - 10.5 K/uL 7.1 8.6 16.2(H)  Hemoglobin 13.0 - 17.0 g/dL 04/04/2018 97.0 26.3  Hematocrit 39.0 - 52.0 % 45.1 48.6 42.2  Platelets 150 - 400 K/uL 179 187 139(L)   CMP Latest Ref Rng & Units 01/14/2020 01/13/2020 01/13/2020  Glucose 70 - 99 mg/dL 96 01/15/2020) -  BUN 8 - 23 mg/dL 18 19 -  Creatinine 885(O - 1.24 mg/dL 2.77) 4.12(I) 7.86(V)  Sodium 135 - 145 mmol/L 140 135 -  Potassium 3.5 - 5.1 mmol/L 3.8 3.9 -  Chloride 98 - 111 mmol/L 108 103 -  CO2 22 - 32 mmol/L 22 19(L) -  Calcium 8.9 - 10.3 mg/dL 6.72(C) 9.4(B) -  Total Protein 6.5 - 8.1 g/dL - 7.4 -  Total Bilirubin 0.3 - 1.2 mg/dL - 1.2 -  Alkaline Phos 38 - 126 U/L - 61 -  AST 15 - 41 U/L - 18 -  ALT 0 - 44 U/L - 23 -    Imaging studies: New abdominal xray shows persistent small bowel dilation but improved. There is contrast in  large intestine and rectum. No free air.   Assessment/Plan:  74 y.o. male with small bowel obstruction, complicated by pertinent comorbidities including asthma, Sleep Apnea and Hypertension.  Patient with improved physical exam, clinically and with images showing contrast in large intestine. This is a good sign that SBO might resolve without surgery. Still not passing significant amount of gas to remove NGT. There is no leukocytosis. Improved renal function with decreased creatinine due to adequate hydration. Will continue current management. Appreciate Hospitalist management of medical condition. Will continue to follow.   62, MD

## 2020-01-14 NOTE — Progress Notes (Signed)
PROGRESS NOTE    Walter Anderson  HKV:[id number] DOB: [birth year] DOA: 01/13/2020 PCP: Lonie Peak, PA-C   Brief Narrative:  Walter Anderson  is a 74 y.o. male with a known history of asthma, hypertension, obstructive sleep apnea on home CPAP, diastolic dysfunction and allergic rhinitis, who presented to the emergency room with acute onset of intractable vomiting since 3 PM on Sunday.  CT abdomen concerning for high-grade SBO with transition point transition point.  NG was placed and surgery was consulted.  Subjective: Patient was feeling slightly better when seen this morning.  He had a small bowel movement earlier today.  Able to tolerate contrast for Gastrografin studies.  Assessment & Plan:   Active Problems:   SBO (small bowel obstruction) (HCC)   High-grade small bowel obstruction.  Patient with history of prior laparoscopic cholecystectomy and umbilical herniorrhaphy. General surgery is following-appreciate their recommendations. -Surgery recommended Gastrografin studies which will be completed in 24-hour. -Continue NG tube with low suctioning. -Continue n.p.o.  AKI.  Most likely secondary to dehydration and taking Lasix and ARB. Creatinine improving, 1.32 today from 1.7 on admission.  Baseline below 1. -Continue gentle IV fluid. -Avoid nephrotoxic. -Monitor renal function.  Hypertension.  Patient was on ARB and Lasix at home which are being held due to AKI. -Continue IV labetalol.  Asthma with no current exacerbation. -Continue as needed duo nebs and his Breo and albuterol MDI.  5.  Gout.  No acute flare. -Continue allopurinol.  6.  Obstructive sleep apnea. -Continue CPAP nightly.  Objective: Vitals:   01/13/20 2345 01/14/20 0549 01/14/20 0901 01/14/20 1310  BP: (!) 142/72 (!) 161/83 (!) 157/83 125/74  Pulse: 89 86 91 80  Resp:  16  16  Temp:  97.7 F (36.5 C)  98.5 F (36.9 C)  TempSrc:  Oral  Oral  SpO2:  97%  94%  Weight:      Height:         Intake/Output Summary (Last 24 hours) at 01/14/2020 1419 Last data filed at 01/14/2020 0553 Gross per 24 hour  Intake 781.94 ml  Output 900 ml  Net -118.06 ml   Filed Weights   01/13/20 1744 01/13/20 2103  Weight: 134.5 kg 130.1 kg    Examination:  General exam: Appears calm and comfortable  Respiratory system: Clear to auscultation. Respiratory effort normal. Cardiovascular system: S1 & S2 heard, RRR. No JVD, murmurs, rubs, gallops or clicks. Gastrointestinal system: Soft, nontender, distended and tympanic abdomen bowel sounds positive. Central nervous system: Alert and oriented. No focal neurological deficits.Symmetric 5 x 5 power. Extremities: No edema, no cyanosis, pulses intact and symmetrical. Skin: No rashes, lesions or ulcers Psychiatry: Judgement and insight appear normal. Mood & affect appropriate.    DVT prophylaxis: Lovenox Code Status: Full Family Communication: Wife was updated at bedside. Disposition Plan: Pending improvement, most likely will go back home.  Consultants:   Surgery  Procedures:  Antimicrobials:   Data Reviewed: I have personally reviewed following labs and imaging studies  CBC: Recent Labs  Lab 01/13/20 1823 01/14/20 0537  WBC 8.6 7.1  NEUTROABS 7.0  --   HGB 16.3 14.9  HCT 48.6 45.1  MCV 91.9 93.6  PLT 187 179   Basic Metabolic Panel: Recent Labs  Lab 01/13/20 1600 01/13/20 1823 01/14/20 0537  NA  --  135 140  K  --  3.9 3.8  CL  --  103 108  CO2  --  19* 22  GLUCOSE  --  120* 96  BUN  --  19 18  CREATININE 1.70* 1.50* 1.32*  CALCIUM  --  8.8* 8.5*   GFR: Estimated Creatinine Clearance: 68.6 mL/min (A) (by C-G formula based on SCr of 1.32 mg/dL (H)). Liver Function Tests: Recent Labs  Lab 01/13/20 1823  AST 18  ALT 23  ALKPHOS 61  BILITOT 1.2  PROT 7.4  ALBUMIN 4.3   Recent Labs  Lab 01/13/20 1823  LIPASE 24   No results for input(s): AMMONIA in the last 168 hours. Coagulation Profile: No results  for input(s): INR, PROTIME in the last 168 hours. Cardiac Enzymes: No results for input(s): CKTOTAL, CKMB, CKMBINDEX, TROPONINI in the last 168 hours. BNP (last 3 results) No results for input(s): PROBNP in the last 8760 hours. HbA1C: No results for input(s): HGBA1C in the last 72 hours. CBG: No results for input(s): GLUCAP in the last 168 hours. Lipid Profile: No results for input(s): CHOL, HDL, LDLCALC, TRIG, CHOLHDL, LDLDIRECT in the last 72 hours. Thyroid Function Tests: No results for input(s): TSH, T4TOTAL, FREET4, T3FREE, THYROIDAB in the last 72 hours. Anemia Panel: No results for input(s): VITAMINB12, FOLATE, FERRITIN, TIBC, IRON, RETICCTPCT in the last 72 hours. Sepsis Labs: Recent Labs  Lab 01/13/20 1823 01/13/20 2017  LATICACIDVEN 1.0 1.1    Recent Results (from the past 240 hour(s))  Respiratory Panel by RT PCR (Flu A&B, Covid) - Nasopharyngeal Swab     Status: None   Collection Time: 01/13/20  6:23 PM   Specimen: Nasopharyngeal Swab  Result Value Ref Range Status   SARS Coronavirus 2 by RT PCR NEGATIVE NEGATIVE Final    Comment: (NOTE) SARS-CoV-2 target nucleic acids are NOT DETECTED. The SARS-CoV-2 RNA is generally detectable in upper respiratoy specimens during the acute phase of infection. The lowest concentration of SARS-CoV-2 viral copies this assay can detect is 131 copies/mL. A negative result does not preclude SARS-Cov-2 infection and should not be used as the sole basis for treatment or other patient management decisions. A negative result may occur with  improper specimen collection/handling, submission of specimen other than nasopharyngeal swab, presence of viral mutation(s) within the areas targeted by this assay, and inadequate number of viral copies (<131 copies/mL). A negative result must be combined with clinical observations, patient history, and epidemiological information. The expected result is Negative. Fact Sheet for Patients:   PinkCheek.be Fact Sheet for Healthcare Providers:  GravelBags.it This test is not yet ap proved or cleared by the [place] FDA and  has been authorized for detection and/or diagnosis of SARS-CoV-2 by FDA under an Emergency Use Authorization (EUA). This EUA will remain  in effect (meaning this test can be used) for the duration of the COVID-19 declaration under Section 564(b)(1) of the Act, 21 U.S.C. section 360bbb-3(b)(1), unless the authorization is terminated or revoked sooner.    Influenza A by PCR NEGATIVE NEGATIVE Final   Influenza B by PCR NEGATIVE NEGATIVE Final    Comment: (NOTE) The Xpert Xpress SARS-CoV-2/FLU/RSV assay is intended as an aid in  the diagnosis of influenza from Nasopharyngeal swab specimens and  should not be used as a sole basis for treatment. Nasal washings and  aspirates are unacceptable for Xpert Xpress SARS-CoV-2/FLU/RSV  testing. Fact Sheet for Patients: PinkCheek.be Fact Sheet for Healthcare Providers: GravelBags.it This test is not yet approved or cleared by the [place] FDA and  has been authorized for detection and/or diagnosis of SARS-CoV-2 by  FDA under an Emergency Use Authorization (EUA). This EUA will remain  in  effect (meaning this test can be used) for the duration of the  Covid-19 declaration under Section 564(b)(1) of the Act, 21  U.S.C. section 360bbb-3(b)(1), unless the authorization is  terminated or revoked. Performed at [facility], [address] [place], [place] [address]      Radiology Studies: DG Abdomen 1 View  Result Date: 01/13/2020 CLINICAL DATA:  Enteric catheter placement EXAM: ABDOMEN - 1 VIEW COMPARISON:  01/13/2020 FINDINGS: Frontal view of the lower chest and upper abdomen demonstrates enteric catheter tip projecting over gastric body. Distended gas-filled loops of small bowel  consistent with small-bowel obstruction unchanged. Lung bases are clear. IMPRESSION: 1. Enteric catheter overlying gastric body. Electronically Signed   By: Sharlet Salina M.D.   On: 01/13/2020 19:31   CT ABDOMEN PELVIS W CONTRAST  Result Date: 01/13/2020 CLINICAL DATA:  Abdominal cramping and bloating for 4 days. Diarrhea. EXAM: CT ABDOMEN AND PELVIS WITH CONTRAST TECHNIQUE: Multidetector CT imaging of the abdomen and pelvis was performed using the standard protocol following bolus administration of intravenous contrast. CONTRAST:  OMNIPAQUE IOHEXOL 300 MG/ML  SOLN COMPARISON:  None. FINDINGS: Lower chest: Lung bases are clear. Atherosclerotic calcification of the aorta and aortic valve. Heart size normal. No pericardial or pleural effusion. Distal esophagus is unremarkable. Hepatobiliary: Liver is unremarkable. Cholecystectomy. No biliary ductal dilatation. Pancreas: Negative. Spleen: Negative. Adrenals/Urinary Tract: Adrenal glands are unremarkable. 2.0 cm low-attenuation lesion in the interpolar right kidney is likely a cyst. Left kidney is markedly atrophic and scarred. Ureters are decompressed. Bladder is low in volume and somewhat obscured by streak artifact from a right hip arthroplasty. Stomach/Bowel: Stomach and proximal and mid small bowel are distended with fluid and air. Suspected transition point in the mid abdomen (2/50 and 5/23) after which there is decompression to normal caliber small bowel. Small bowel wall attenuation is within normal limits. Appendix is unremarkable. Fluid is seen in the proximal colon. Colon is otherwise unremarkable. Vascular/Lymphatic: Atherosclerotic calcification of the aorta without aneurysm. No pathologically enlarged lymph nodes. Reproductive: Prostate is minimally prominent. Other: Scattered free fluid. Minimal mesenteric edema. No free air. Musculoskeletal: Right hip arthroplasty. Degenerative changes in the spine. No worrisome lytic or sclerotic lesions.  IMPRESSION: 1. High-grade small bowel obstruction with transition point suspected in the mid small bowel. No definitive evidence of ischemia. Scattered free fluid and mild mesenteric edema can be indicators of impending ischemia. Critical Value/emergent results were called by telephone at the time of interpretation on 01/13/2020 at 5:09 pm to provider [facility] NP, who verbally acknowledged these results. 2.  Aortic atherosclerosis (ICD10-I70.0). Electronically Signed   By: Leanna Battles M.D.   On: 01/13/2020 17:13    Scheduled Meds: . allopurinol  100 mg Oral Daily  . doxazosin  4 mg Oral BID  . enoxaparin (LOVENOX) injection  40 mg Subcutaneous BID  . fluticasone furoate-vilanterol  1 puff Inhalation Daily  . metoprolol succinate  100 mg Oral Daily  . multivitamin with minerals  1 tablet Oral Daily  . omega-3 acid ethyl esters  2 g Oral Daily  . pantoprazole (PROTONIX) IV  40 mg Intravenous Q12H  . [START ON 01/15/2020] potassium chloride  20 mEq Oral Daily  . testosterone  10 g Transdermal Daily   Continuous Infusions: . sodium chloride 100 mL/hr at 01/14/20 0553     LOS: 1 day   Time spent: 40 minutes.  Arnetha Courser, MD [facility]  If 7PM-7AM, please contact night-coverage [email or web address]  01/14/2020, 2:19 PM   This  record has been created using Systems analyst. Errors have been sought and corrected,but may not always be located. Such creation errors do not reflect on the standard of care.

## 2020-01-15 ENCOUNTER — Inpatient Hospital Stay: Payer: Medicare PPO

## 2020-01-15 LAB — COMPREHENSIVE METABOLIC PANEL
ALT: 21 U/L (ref 0–44)
AST: 18 U/L (ref 15–41)
Albumin: 3.7 g/dL (ref 3.5–5.0)
Alkaline Phosphatase: 53 U/L (ref 38–126)
Anion gap: 11 (ref 5–15)
BUN: 16 mg/dL (ref 8–23)
CO2: 18 mmol/L — ABNORMAL LOW (ref 22–32)
Calcium: 8.6 mg/dL — ABNORMAL LOW (ref 8.9–10.3)
Chloride: 111 mmol/L (ref 98–111)
Creatinine, Ser: 1.14 mg/dL (ref 0.61–1.24)
GFR calc Af Amer: >60 mL/min (ref >60)
GFR calc non Af Amer: >60 mL/min (ref >60)
Glucose, Bld: 83 mg/dL (ref 70–99)
Potassium: 3.8 mmol/L (ref 3.5–5.1)
Sodium: 140 mmol/L (ref 135–145)
Total Bilirubin: 1.1 mg/dL (ref 0.3–1.2)
Total Protein: 6.5 g/dL (ref 6.5–8.1)

## 2020-01-15 LAB — CBC
HCT: 43.4 % (ref 39.0–52.0)
Hemoglobin: 14.1 g/dL (ref 13.0–17.0)
MCH: 30.6 pg (ref 26.0–34.0)
MCHC: 32.5 g/dL (ref 30.0–36.0)
MCV: 94.1 fL (ref 80.0–100.0)
Platelets: 169 10*3/uL (ref 150–400)
RBC: 4.61 MIL/uL (ref 4.22–5.81)
RDW: 12.8 % (ref 11.5–15.5)
WBC: 8.2 10*3/uL (ref 4.0–10.5)
nRBC: 0 % (ref 0.0–0.2)

## 2020-01-15 NOTE — Progress Notes (Signed)
Patient has refused cpap due to naso gastric tube and the irritation that it is causing at this time. Will have RN to call RT if he changes mind while here.

## 2020-01-15 NOTE — Progress Notes (Signed)
PROGRESS NOTE    Walter Anderson  JTT:[id number] DOB: 01-22-1946 DOA: 01/13/2020 PCP: Lonie Peak, PA-C   Brief Narrative:  Walter Anderson  is a 74 y.o. male with a known history of asthma, hypertension, obstructive sleep apnea on home CPAP, diastolic dysfunction and allergic rhinitis, who presented to the emergency room with acute onset of intractable vomiting since 3 PM on Sunday.  CT abdomen concerning for high-grade SBO with transition point transition point.  NG was placed and surgery was consulted.  Subjective: Patient was improving when seen this morning.  He was passing gas and had a small watery bowel movement.  He was able to tolerate clear liquid.  Assessment & Plan:   Active Problems:   Small bowel obstruction (HCC)   High-grade small bowel obstruction.  Patient with history of prior laparoscopic cholecystectomy and umbilical herniorrhaphy. General surgery is following-appreciate their recommendations. -Gastrografin studies shows contrast in colon and rectum. -SBO clinically improving patient was able to tolerate clear liquid, NG was clamped and plan was to removed once able to tolerate diet. -Monitor for today and advance diet as tolerated-if remains stable can go home tomorrow.  AKI.  Most likely secondary to dehydration and taking Lasix and ARB. Creatinine improving, 1.14 today from 1.7 on admission.  Baseline below 1. -Continue gentle IV fluid. -Avoid nephrotoxic. -Monitor renal function.  Hypertension.  Patient was on ARB and Lasix at home which are being held due to AKI. -Continue IV labetalol. -Most likely should be able to restart home ARB from tomorrow.  Asthma with no current exacerbation. -Continue as needed duo nebs and his Breo and albuterol MDI.  5.  Gout.  No acute flare. -Continue allopurinol.  6.  Obstructive sleep apnea. -Continue CPAP nightly.  Objective: Vitals:   01/14/20 2030 01/15/20 0457 01/15/20 0851 01/15/20 1324  BP: 137/78 134/72  (!) 159/94 (!) 154/76  Pulse: 73 77 91 73  Resp: 18 18 18    Temp: 98.2 F (36.8 C) 98.2 F (36.8 C) 98.5 F (36.9 C) 98.1 F (36.7 C)  TempSrc: Oral Oral Oral Oral  SpO2: 96% 96% 98% 97%  Weight:      Height:        Intake/Output Summary (Last 24 hours) at 01/15/2020 1336 Last data filed at 01/15/2020 1326 Gross per 24 hour  Intake 1481.12 ml  Output 270 ml  Net 1211.12 ml   Filed Weights   01/13/20 1744 01/13/20 2103  Weight: 134.5 kg 130.1 kg    Examination:  General exam: Appears calm and comfortable.  NG tube was in place. Respiratory system: Clear to auscultation. Respiratory effort normal. Cardiovascular system: S1 & S2 heard, RRR. No JVD, murmurs, rubs, gallops or clicks. Gastrointestinal system: Soft, nontender, mildly distended, bowel sounds positive. Central nervous system: Alert and oriented. No focal neurological deficits.Symmetric 5 x 5 power. Extremities: No edema, no cyanosis, pulses intact and symmetrical. Skin: No rashes, lesions or ulcers Psychiatry: Judgement and insight appear normal. Mood & affect appropriate.    DVT prophylaxis: Lovenox Code Status: Full Family Communication: Wife was updated at bedside. Disposition Plan: Pending improvement, most likely will go back home.  Consultants:   Surgery  Procedures:  Antimicrobials:   Data Reviewed: I have personally reviewed following labs and imaging studies  CBC: Recent Labs  Lab 01/13/20 1823 01/14/20 0537 01/15/20 0444  WBC 8.6 7.1 8.2  NEUTROABS 7.0  --   --   HGB 16.3 14.9 14.1  HCT 48.6 45.1 43.4  MCV 91.9 93.6 94.1  PLT 187 179 169   Basic Metabolic Panel: Recent Labs  Lab 01/13/20 1600 01/13/20 1823 01/14/20 0537 01/15/20 0444  NA  --  135 140 140  K  --  3.9 3.8 3.8  CL  --  103 108 111  CO2  --  19* 22 18*  GLUCOSE  --  120* 96 83  BUN  --  19 18 16   CREATININE 1.70* 1.50* 1.32* 1.14  CALCIUM  --  8.8* 8.5* 8.6*   GFR: Estimated Creatinine Clearance: 79.4  mL/min (by C-G formula based on SCr of 1.14 mg/dL). Liver Function Tests: Recent Labs  Lab 01/13/20 1823 01/15/20 0444  AST 18 18  ALT 23 21  ALKPHOS 61 53  BILITOT 1.2 1.1  PROT 7.4 6.5  ALBUMIN 4.3 3.7   Recent Labs  Lab 01/13/20 1823  LIPASE 24   No results for input(s): AMMONIA in the last 168 hours. Coagulation Profile: No results for input(s): INR, PROTIME in the last 168 hours. Cardiac Enzymes: No results for input(s): CKTOTAL, CKMB, CKMBINDEX, TROPONINI in the last 168 hours. BNP (last 3 results) No results for input(s): PROBNP in the last 8760 hours. HbA1C: No results for input(s): HGBA1C in the last 72 hours. CBG: No results for input(s): GLUCAP in the last 168 hours. Lipid Profile: No results for input(s): CHOL, HDL, LDLCALC, TRIG, CHOLHDL, LDLDIRECT in the last 72 hours. Thyroid Function Tests: No results for input(s): TSH, T4TOTAL, FREET4, T3FREE, THYROIDAB in the last 72 hours. Anemia Panel: No results for input(s): VITAMINB12, FOLATE, FERRITIN, TIBC, IRON, RETICCTPCT in the last 72 hours. Sepsis Labs: Recent Labs  Lab 01/13/20 1823 01/13/20 2017  LATICACIDVEN 1.0 1.1    Recent Results (from the past 240 hour(s))  Respiratory Panel by RT PCR (Flu A&B, Covid) - Nasopharyngeal Swab     Status: None   Collection Time: 01/13/20  6:23 PM   Specimen: Nasopharyngeal Swab  Result Value Ref Range Status   SARS Coronavirus 2 by RT PCR NEGATIVE NEGATIVE Final    Comment: (NOTE) SARS-CoV-2 target nucleic acids are NOT DETECTED. The SARS-CoV-2 RNA is generally detectable in upper respiratoy specimens during the acute phase of infection. The lowest concentration of SARS-CoV-2 viral copies this assay can detect is 131 copies/mL. A negative result does not preclude SARS-Cov-2 infection and should not be used as the sole basis for treatment or other patient management decisions. A negative result may occur with  improper specimen collection/handling, submission  of specimen other than nasopharyngeal swab, presence of viral mutation(s) within the areas targeted by this assay, and inadequate number of viral copies (<131 copies/mL). A negative result must be combined with clinical observations, patient history, and epidemiological information. The expected result is Negative. Fact Sheet for Patients:  01/15/20 Fact Sheet for Healthcare Providers:  https://www.moore.com/ This test is not yet ap proved or cleared by the https://www.young.biz/ FDA and  has been authorized for detection and/or diagnosis of SARS-CoV-2 by FDA under an Emergency Use Authorization (EUA). This EUA will remain  in effect (meaning this test can be used) for the duration of the COVID-19 declaration under Section 564(b)(1) of the Act, 21 U.S.C. section 360bbb-3(b)(1), unless the authorization is terminated or revoked sooner.    Influenza A by PCR NEGATIVE NEGATIVE Final   Influenza B by PCR NEGATIVE NEGATIVE Final    Comment: (NOTE) The Xpert Xpress SARS-CoV-2/FLU/RSV assay is intended as an aid in  the diagnosis of influenza from Nasopharyngeal swab specimens and  should not  be used as a sole basis for treatment. Nasal washings and  aspirates are unacceptable for Xpert Xpress SARS-CoV-2/FLU/RSV  testing. Fact Sheet for Patients: https://www.moore.com/ Fact Sheet for Healthcare Providers: https://www.young.biz/ This test is not yet approved or cleared by the [place] FDA and  has been authorized for detection and/or diagnosis of SARS-CoV-2 by  FDA under an Emergency Use Authorization (EUA). This EUA will remain  in effect (meaning this test can be used) for the duration of the  Covid-19 declaration under Section 564(b)(1) of the Act, 21  U.S.C. section 360bbb-3(b)(1), unless the authorization is  terminated or revoked. Performed at [facility], [address]  [place], [place] [address]      Radiology Studies: DG Abdomen 1 View  Result Date: 01/13/2020 CLINICAL DATA:  Enteric catheter placement EXAM: ABDOMEN - 1 VIEW COMPARISON:  01/13/2020 FINDINGS: Frontal view of the lower chest and upper abdomen demonstrates enteric catheter tip projecting over gastric body. Distended gas-filled loops of small bowel consistent with small-bowel obstruction unchanged. Lung bases are clear. IMPRESSION: 1. Enteric catheter overlying gastric body. Electronically Signed   By: Sharlet Salina M.D.   On: 01/13/2020 19:31   CT ABDOMEN PELVIS W CONTRAST  Result Date: 01/13/2020 CLINICAL DATA:  Abdominal cramping and bloating for 4 days. Diarrhea. EXAM: CT ABDOMEN AND PELVIS WITH CONTRAST TECHNIQUE: Multidetector CT imaging of the abdomen and pelvis was performed using the standard protocol following bolus administration of intravenous contrast. CONTRAST:  OMNIPAQUE IOHEXOL 300 MG/ML  SOLN COMPARISON:  None. FINDINGS: Lower chest: Lung bases are clear. Atherosclerotic calcification of the aorta and aortic valve. Heart size normal. No pericardial or pleural effusion. Distal esophagus is unremarkable. Hepatobiliary: Liver is unremarkable. Cholecystectomy. No biliary ductal dilatation. Pancreas: Negative. Spleen: Negative. Adrenals/Urinary Tract: Adrenal glands are unremarkable. 2.0 cm low-attenuation lesion in the interpolar right kidney is likely a cyst. Left kidney is markedly atrophic and scarred. Ureters are decompressed. Bladder is low in volume and somewhat obscured by streak artifact from a right hip arthroplasty. Stomach/Bowel: Stomach and proximal and mid small bowel are distended with fluid and air. Suspected transition point in the mid abdomen (2/50 and 5/23) after which there is decompression to normal caliber small bowel. Small bowel wall attenuation is within normal limits. Appendix is unremarkable. Fluid is seen in the proximal colon. Colon is otherwise unremarkable.  Vascular/Lymphatic: Atherosclerotic calcification of the aorta without aneurysm. No pathologically enlarged lymph nodes. Reproductive: Prostate is minimally prominent. Other: Scattered free fluid. Minimal mesenteric edema. No free air. Musculoskeletal: Right hip arthroplasty. Degenerative changes in the spine. No worrisome lytic or sclerotic lesions. IMPRESSION: 1. High-grade small bowel obstruction with transition point suspected in the mid small bowel. No definitive evidence of ischemia. Scattered free fluid and mild mesenteric edema can be indicators of impending ischemia. Critical Value/emergent results were called by telephone at the time of interpretation on 01/13/2020 at 5:09 pm to provider [facility] NP, who verbally acknowledged these results. 2.  Aortic atherosclerosis (ICD10-I70.0). Electronically Signed   By: Leanna Battles M.D.   On: 01/13/2020 17:13   DG Abd 2 Views  Result Date: 01/15/2020 CLINICAL DATA:  Bowel obstruction EXAM: ABDOMEN - 2 VIEW COMPARISON:  01/14/2020 FINDINGS: Enteric tube terminates within the proximal stomach, side port just beyond the level of the GE junction. Dilated loop of small bowel in the left hemiabdomen measures up to 5.0 cm, previously 6.5 cm. Air and contrast are seen within the colon. No gross free intraperitoneal air. IMPRESSION: Slight  interval decrease in the degree of small bowel dilation. Enteric tube terminates within the proximal stomach. Electronically Signed   By: Davina Poke D.O.   On: 01/15/2020 10:07   DG Abd Portable 1V-Small Bowel Obstruction Protocol-initial, 8 hr delay  Result Date: 01/14/2020 CLINICAL DATA:  SBO. EXAM: PORTABLE ABDOMEN - 1 VIEW COMPARISON:  01/13/2020 FINDINGS: The enteric tube projects over the gastric body. The tip is pointed distally. Again noted are dilated loops of small bowel measuring up to approximately 6 cm. Oral contrast is noted to the level of the patient's rectum. Patient is status post total hip  arthroplasty on the left. There are moderate degenerative changes of the left hip. IMPRESSION: 1. Relatively stable positioning of the NG tube. 2. Oral contrast is noted in the colon to the level of the rectum. 3. Persistent dilated loops of small bowel as above. Electronically Signed   By: Constance Holster M.D.   On: 01/14/2020 19:12    Scheduled Meds: . allopurinol  100 mg Oral Daily  . doxazosin  4 mg Oral BID  . enoxaparin (LOVENOX) injection  40 mg Subcutaneous BID  . fluticasone furoate-vilanterol  1 puff Inhalation Daily  . metoprolol succinate  100 mg Oral Daily  . multivitamin with minerals  1 tablet Oral Daily  . omega-3 acid ethyl esters  2 g Oral Daily  . pantoprazole (PROTONIX) IV  40 mg Intravenous Q12H  . potassium chloride  20 mEq Oral Daily  . testosterone  10 g Transdermal Daily   Continuous Infusions: . sodium chloride 100 mL/hr at 01/15/20 0335     LOS: 2 days   Time spent: 35 minutes.  Lorella Nimrod, MD [facility]  If 7PM-7AM, please contact night-coverage [email or web address]  01/15/2020, 1:36 PM   This record has been created using Systems analyst. Errors have been sought and corrected,but may not always be located. Such creation errors do not reflect on the standard of care.

## 2020-01-15 NOTE — Progress Notes (Signed)
SURGICAL PROGRESS NOTE   Hospital Day(s): 2.   Post op day(s):  Marland Kitchen   Interval History: Patient seen and examined, no acute events or new complaints overnight. Patient reports feeling better today.  He reported he is passing gas.  He denied nausea or vomiting.  There is no abdominal pain.  There is no pain radiation.  There is no alleviating or aggravating factor.  Vital signs in last 24 hours: [min-max] current  Temp:  [98.1 F (36.7 C)-98.5 F (36.9 C)] 98.1 F (36.7 C) (02/26 1324) Pulse Rate:  [73-91] 73 (02/26 1324) Resp:  [18] 18 (02/26 0851) BP: (134-159)/(72-94) 154/76 (02/26 1324) SpO2:  [96 %-98 %] 97 % (02/26 1324)     Height: 5\' 9"  (175.3 cm) Weight: 130.1 kg BMI (Calculated): 42.34   NGT: 50 mL in last 24 hours  Physical Exam:  Constitutional: alert, cooperative and no distress  Respiratory: breathing non-labored at rest  Cardiovascular: regular rate and sinus rhythm  Gastrointestinal: soft, non-tender, and non-distended  Labs:  CBC Latest Ref Rng & Units 01/15/2020 01/14/2020 01/13/2020  WBC 4.0 - 10.5 K/uL 8.2 7.1 8.6  Hemoglobin 13.0 - 17.0 g/dL 01/15/2020 19.3 79.0  Hematocrit 39.0 - 52.0 % 43.4 45.1 48.6  Platelets 150 - 400 K/uL 169 179 187   CMP Latest Ref Rng & Units 01/15/2020 01/14/2020 01/13/2020  Glucose 70 - 99 mg/dL 83 96 01/15/2020)  BUN 8 - 23 mg/dL 16 18 19   Creatinine 0.61 - 1.24 mg/dL 973(Z ) 3.29)  Sodium 135 - 145 mmol/L 140 140 135  Potassium 3.5 - 5.1 mmol/L 3.8 3.8 3.9  Chloride 98 - 111 mmol/L 111 108 103  CO2 22 - 32 mmol/L 18(L) 22 19(L)  Calcium 8.9 - 10.3 mg/dL 9.24(Q) 6.83(M) 1.9(Q)  Total Protein 6.5 - 8.1 g/dL 6.5 - 7.4  Total Bilirubin 0.3 - 1.2 mg/dL 1.1 - 1.2  Alkaline Phos 38 - 126 U/L 53 - 61  AST 15 - 41 U/L 18 - 18  ALT 0 - 44 U/L 21 - 23    Imaging studies: No new pertinent imaging studies   Assessment/Plan:  74 y.o.malewith small bowel obstruction, complicated by pertinent comorbidities includingasthma, Sleep Apnea  and Hypertension.  Patient with resolving small bowel obstruction.  Clinically the patient is passing gas and with abdominal pain.  The abdomen is softer without significant distention.  Abdominal x-ray today was personally reviewed.  The small bowel dilation is decreased.  Contrast was seen yesterday in the rectum.  Patient is passing gas.  We will start liquid diet.  If patient tolerated will discontinue NGT.  Patient encouraged to ambulate.  We will continue with DVT prophylaxis.  Will advance diet as he tolerates.  Appreciate hospitalist admission for medical management of medical comorbidities.  9.7(L, MD

## 2020-01-15 NOTE — Progress Notes (Signed)
[facility] Gastroenterology Courtesy Note  Re:   Walter Anderson DOB:   03-Jan-1946 MRN:   [id number]  Subjective   Walter Anderson is a nice man who I initially saw in the office on 01/13/2020. He had diffuse abdominal pain and was found to have partial SBO on radiographic studies.  He appears improved without pain. He is passing flatus and very small fecal matter. NO further vomiting.  Appreciate the Hospitalist and Surgical services for the fine management of this patient.    Exam:  BP (!) 154/76 (BP Location: Right Arm)   Pulse 73   Temp 98.1 F (36.7 C) (Oral)   Resp 18   Ht 5\' 9"  (1.753 m)   Wt 130.1 kg   SpO2 97%   BMI 42.36 kg/m    General: NAD. NGT in L nares.  Eyes: sclera anicteric, no redness  ENT: oral mucosa moist without lesions, no cervical or supraclavicular lymphadenopathy  CV: RRR without murmur, S1/S2, no JVD, no peripheral edema  Resp: clear to auscultation bilaterally, normal RR and effort noted  GI: soft, obese, moderately tender, with hypoactive but present bowel sounds. No guarding or palpable organomegaly noted.  Skin; warm and dry, no rash or jaundice noted  Neuro: awake, alert and oriented x 3. Normal gross motor function and fluent speech     Radiology: Follow up KUB today shows interval decrease in small bowel dilation (5cm as opposed to 6.5cm). Contrast noted in colon.   Assessment: 1. Small bowel obstruction - Likely secondary to adhesions. Slowly improving with NG decompression. Patient tolerating clear liquid diet. I appreciate Surgery team help with management.  Plan: 1. Continue care as prescribed. 2. Discharge appears feasible in the short term as contrast has reached colon. 3. I am on call this weekend. Please do not hesitate to call me if I can be of assistance [phone].     [phone], MD 4:20 PM  01/15/2020

## 2020-01-15 NOTE — Care Management Important Message (Signed)
Important Message  Patient Details  Name: Walter Anderson MRN: [id number] Date of Birth: 1946-07-04   Medicare Important Message Given:  Yes  Initial Medicare IM given by Patient Access Associate on 01/14/2020 at 8:56am.    Johnell Comings 01/15/2020, 8:28 AM

## 2020-01-15 NOTE — Plan of Care (Signed)

## 2020-01-16 LAB — BASIC METABOLIC PANEL
Anion gap: 8 (ref 5–15)
BUN: 9 mg/dL (ref 8–23)
CO2: 21 mmol/L — ABNORMAL LOW (ref 22–32)
Calcium: 8.5 mg/dL — ABNORMAL LOW (ref 8.9–10.3)
Chloride: 108 mmol/L (ref 98–111)
Creatinine, Ser: 1.11 mg/dL (ref 0.61–1.24)
GFR calc Af Amer: >60 mL/min (ref >60)
GFR calc non Af Amer: >60 mL/min (ref >60)
Glucose, Bld: 134 mg/dL — ABNORMAL HIGH (ref 70–99)
Potassium: 3.9 mmol/L (ref 3.5–5.1)
Sodium: 137 mmol/L (ref 135–145)

## 2020-01-16 MED ORDER — BISACODYL 10 MG RE SUPP
10.0000 mg | Freq: Once | RECTAL | Status: AC
  Administered 2020-01-16: 10 mg via RECTAL
  Filled 2020-01-16: qty 1

## 2020-01-16 NOTE — Progress Notes (Signed)
SURGICAL PROGRESS NOTE   Hospital Day(s): 3.   Post op day(s):  Marland Kitchen   Interval History: Patient seen and examined, no acute events or new complaints overnight. Patient reports feeling well.  He reports that he has been eating full liquids without any nausea or vomiting.  He denies abdominal pain.  He reports he continue passing gas.  Vital signs in last 24 hours: [min-max] current  Temp:  [98 F (36.7 C)-100.1 F (37.8 C)] 99.5 F (37.5 C) (02/27 0352) Pulse Rate:  [73-81] 81 (02/27 0352) Resp:  [18-20] 18 (02/27 0352) BP: (138-159)/(7-76) 138/7 (02/27 0847) SpO2:  [95 %-97 %] 95 % (02/27 0352)     Height: 5\' 9"  (175.3 cm) Weight: 130.1 kg BMI (Calculated): 42.34   NGT: Clamped for 24 hours  Physical Exam:  Constitutional: alert, cooperative and no distress  Respiratory: breathing non-labored at rest  Cardiovascular: regular rate and sinus rhythm  Gastrointestinal: soft, non-tender, and non-distended  Labs:  CBC Latest Ref Rng & Units 01/15/2020 01/14/2020 01/13/2020  WBC 4.0 - 10.5 K/uL 8.2 7.1 8.6  Hemoglobin 13.0 - 17.0 g/dL 01/15/2020 06.2 37.6  Hematocrit 39.0 - 52.0 % 43.4 45.1 48.6  Platelets 150 - 400 K/uL 169 179 187   CMP Latest Ref Rng & Units 01/16/2020 01/15/2020 01/14/2020  Glucose 70 - 99 mg/dL 01/16/2020) 83 96  BUN 8 - 23 mg/dL 9 16 18   Creatinine 0.61 - 1.24 mg/dL 151(V 6.16)  Sodium 135 - 145 mmol/L 137 140 140  Potassium 3.5 - 5.1 mmol/L 3.9 3.8 3.8  Chloride 98 - 111 mmol/L 108 111 108  CO2 22 - 32 mmol/L 21(L) 18(L) 22  Calcium 8.9 - 10.3 mg/dL 0.73) 7.10(G) 2.6(R)  Total Protein 6.5 - 8.1 g/dL - 6.5 -  Total Bilirubin 0.3 - 1.2 mg/dL - 1.1 -  Alkaline Phos 38 - 126 U/L - 53 -  AST 15 - 41 U/L - 18 -  ALT 0 - 44 U/L - 21 -    Imaging studies: No new pertinent imaging studies   Assessment/Plan:  73 y.o.malewith small bowel obstruction, complicated by pertinent comorbidities includingasthma, Sleep Apnea and Hypertension.  Patient with resolved  small bowel obstruction.  I remove NGT today CT has been tolerating full liquids with NGT clamped for 24 hours.  He continue passing gas.  I will advance diet to soft diet.  If patient tolerates of diet he may be able to discharge later today.  He does not need follow-up with surgery as outpatient since there was no surgery during admission.  He should be followed by primary care physician and gastroenterology.  Gastroenterology was planning on doing colonoscopy as outpatient after he recovered from this episode.  Patient oriented to continuing a healthy soft diet.  4.6(E, MD

## 2020-01-16 NOTE — Discharge Summary (Signed)
Physician Discharge Summary  Walter Anderson FIE:[id number] DOB: 19-Jan-1946 DOA: 01/13/2020  PCP: Lonie Peak, PA-C  Admit date: 01/13/2020 Discharge date: 01/16/2020  Admitted From: Home Disposition: Home  Recommendations for Outpatient Follow-up:  1. Follow up with PCP in 1-2 weeks 2. Please obtain BMP/CBC in one week 3. Please follow up on the following pending results: None  Home Health: No Equipment/Devices: None Discharge Condition: Stable CODE STATUS: Full Diet recommendation: Heart Healthy   Brief/Interim Summary: [name] a73 y.o.malewith a known history of asthma, hypertension, obstructive sleep apnea on home CPAP, diastolic dysfunction and allergic rhinitis, who presented to the emergency room with acute onset of intractable vomiting since 3 PM on Sunday.  CT abdomen concerning for high-grade SBO with transition point transition point.  NG was placed and surgery was consulted.  They advised conservative management.  Gastrografin studies done next day with contrast in whole colon.  Patient improves clinically and able to initially tolerate clear liquid.  Passing gas and a small BM.  NG tube was removed and diet was advanced which he was able to tolerate well.  He will follow-up with his primary care physician.  He developed AKI most likely secondary to dehydration and continue to take Lasix and ARB.  His home dose of Lasix and ARB was held during hospitalization.  Creatinine improved.  He will restart his home medications on discharge.  He will continue rest of his home meds.  Discharge Diagnoses:  Active Problems:   Small bowel obstruction [facility])  Discharge Instructions  Discharge Instructions    Diet - low sodium heart healthy   Complete by: As directed    Discharge instructions   Complete by: As directed    It was pleasure taking care of you. Please keep yourself well-hydrated and advance your diet slowly. Follow-up with your primary care in 1 to 2 weeks.    Increase activity slowly   Complete by: As directed      Allergies as of 01/16/2020   No Known Allergies     Medication List    TAKE these medications   albuterol 108 (90 Base) MCG/ACT inhaler Commonly known as: VENTOLIN HFA Inhale 1-2 puffs into the lungs every 6 (six) hours as needed for wheezing or shortness of breath.   allopurinol 100 MG tablet Commonly known as: ZYLOPRIM Take 100 mg by mouth daily.   amLODipine 2.5 MG tablet Commonly known as: NORVASC Take 2.5 mg by mouth daily.   Breo Ellipta 100-25 MCG/INH Aepb Generic drug: fluticasone furoate-vilanterol Inhale 1 puff into the lungs daily.   BROMELAIN PO Take 240 mg by mouth daily.   Coenzyme Q10 400 MG Caps Take 400 mg by mouth daily.   colchicine 0.6 MG tablet Take 1.2 mg by mouth daily as needed (gout).   docusate sodium 100 MG capsule Commonly known as: Colace Take 1 capsule (100 mg total) by mouth 2 (two) times daily.   doxazosin 4 MG tablet Commonly known as: CARDURA Take 4 mg by mouth 2 (two) times daily.   ferrous sulfate 325 (65 FE) MG tablet Commonly known as: FerrouSul Take 1 tablet (325 mg total) by mouth 3 (three) times daily with meals.   furosemide 40 MG tablet Commonly known as: LASIX Take 40 mg by mouth daily as needed.   magnesium gluconate 500 MG tablet Commonly known as: MAGONATE Take 500 mg by mouth 2 (two) times daily.   metoprolol succinate 100 MG 24 hr tablet Commonly known as: TOPROL-XL Take 100 mg by  mouth daily.   multivitamin with minerals Tabs tablet Take 1 tablet by mouth daily.   niacin 500 MG tablet Take 500 mg by mouth at bedtime.   omega-3 acid ethyl esters 1 g capsule Commonly known as: LOVAZA Take 2 g by mouth daily.   potassium chloride SA 20 MEQ tablet Commonly known as: KLOR-CON Take 1 tablet by mouth daily.   psyllium 95 % Pack Commonly known as: HYDROCIL/METAMUCIL Take 2 packets by mouth daily.   testosterone 50 MG/5GM (1%) Gel Commonly  known as: ANDROGEL Place 10 g onto the skin daily.   TURMERIC PO Take 750 mg by mouth daily.   valsartan 320 MG tablet Commonly known as: DIOVAN Take 320 mg by mouth daily.       No Known Allergies  Consultations:  General surgery.  Procedures/Studies: DG Abdomen 1 View  Result Date: 01/13/2020 CLINICAL DATA:  Enteric catheter placement EXAM: ABDOMEN - 1 VIEW COMPARISON:  01/13/2020 FINDINGS: Frontal view of the lower chest and upper abdomen demonstrates enteric catheter tip projecting over gastric body. Distended gas-filled loops of small bowel consistent with small-bowel obstruction unchanged. Lung bases are clear. IMPRESSION: 1. Enteric catheter overlying gastric body. Electronically Signed   By: Randa Ngo M.D.   On: 01/13/2020 19:31   CT ABDOMEN PELVIS W CONTRAST  Result Date: 01/13/2020 CLINICAL DATA:  Abdominal cramping and bloating for 4 days. Diarrhea. EXAM: CT ABDOMEN AND PELVIS WITH CONTRAST TECHNIQUE: Multidetector CT imaging of the abdomen and pelvis was performed using the standard protocol following bolus administration of intravenous contrast. CONTRAST:  136mL OMNIPAQUE IOHEXOL 300 MG/ML  SOLN COMPARISON:  None. FINDINGS: Lower chest: Lung bases are clear. Atherosclerotic calcification of the aorta and aortic valve. Heart size normal. No pericardial or pleural effusion. Distal esophagus is unremarkable. Hepatobiliary: Liver is unremarkable. Cholecystectomy. No biliary ductal dilatation. Pancreas: Negative. Spleen: Negative. Adrenals/Urinary Tract: Adrenal glands are unremarkable. 2.0 cm low-attenuation lesion in the interpolar right kidney is likely a cyst. Left kidney is markedly atrophic and scarred. Ureters are decompressed. Bladder is low in volume and somewhat obscured by streak artifact from a right hip arthroplasty. Stomach/Bowel: Stomach and proximal and mid small bowel are distended with fluid and air. Suspected transition point in the mid abdomen (2/50 and  5/23) after which there is decompression to normal caliber small bowel. Small bowel wall attenuation is within normal limits. Appendix is unremarkable. Fluid is seen in the proximal colon. Colon is otherwise unremarkable. Vascular/Lymphatic: Atherosclerotic calcification of the aorta without aneurysm. No pathologically enlarged lymph nodes. Reproductive: Prostate is minimally prominent. Other: Scattered free fluid. Minimal mesenteric edema. No free air. Musculoskeletal: Right hip arthroplasty. Degenerative changes in the spine. No worrisome lytic or sclerotic lesions. IMPRESSION: 1. High-grade small bowel obstruction with transition point suspected in the mid small bowel. No definitive evidence of ischemia. Scattered free fluid and mild mesenteric edema can be indicators of impending ischemia. Critical Value/emergent results were called by telephone at the time of interpretation on 01/13/2020 at 5:09 pm to provider [facility] NP, who verbally acknowledged these results. 2.  Aortic atherosclerosis (ICD10-I70.0). Electronically Signed   By: Lorin Picket M.D.   On: 01/13/2020 17:13   DG Abd 2 Views  Result Date: 01/15/2020 CLINICAL DATA:  Bowel obstruction EXAM: ABDOMEN - 2 VIEW COMPARISON:  01/14/2020 FINDINGS: Enteric tube terminates within the proximal stomach, side port just beyond the level of the GE junction. Dilated loop of small bowel in the left hemiabdomen measures up to 5.0 cm, previously  6.5 cm. Air and contrast are seen within the colon. No gross free intraperitoneal air. IMPRESSION: Slight interval decrease in the degree of small bowel dilation. Enteric tube terminates within the proximal stomach. Electronically Signed   By: Duanne Guess D.O.   On: 01/15/2020 10:07   DG Abd Portable 1V-Small Bowel Obstruction Protocol-initial, 8 hr delay  Result Date: 01/14/2020 CLINICAL DATA:  SBO. EXAM: PORTABLE ABDOMEN - 1 VIEW COMPARISON:  01/13/2020 FINDINGS: The enteric tube projects over the  gastric body. The tip is pointed distally. Again noted are dilated loops of small bowel measuring up to approximately 6 cm. Oral contrast is noted to the level of the patient's rectum. Patient is status post total hip arthroplasty on the left. There are moderate degenerative changes of the left hip. IMPRESSION: 1. Relatively stable positioning of the NG tube. 2. Oral contrast is noted in the colon to the level of the rectum. 3. Persistent dilated loops of small bowel as above. Electronically Signed   By: Katherine Mantle M.D.   On: 01/14/2020 19:12     Subjective: Patient was feeling better when seen during morning rounds.  He was able to tolerate full liquid breakfast.  He was eager to go home.  Discharge Exam: Vitals:   01/16/20 0846 01/16/20 0847  BP: (!) 138/7 (!) 138/7  Pulse:    Resp:    Temp:    SpO2:     Vitals:   01/15/20 2227 01/16/20 0352 01/16/20 0846 01/16/20 0847  BP:  (!) 157/64 (!) 138/7 (!) 138/7  Pulse:  81    Resp:  18    Temp: 98 F (36.7 C) 99.5 F (37.5 C)    TempSrc: Oral Oral    SpO2:  95%    Weight:      Height:        General: Pt is alert, awake, not in acute distress Cardiovascular: RRR, S1/S2 +, no rubs, no gallops Respiratory: CTA bilaterally, no wheezing, no rhonchi Abdominal: Soft, NT, ND, bowel sounds + Extremities: no edema, no cyanosis   The results of significant diagnostics from this hospitalization (including imaging, microbiology, ancillary and laboratory) are listed below for reference.    Microbiology: Recent Results (from the past 240 hour(s))  Respiratory Panel by RT PCR (Flu A&B, Covid) - Nasopharyngeal Swab     Status: None   Collection Time: 01/13/20  6:23 PM   Specimen: Nasopharyngeal Swab  Result Value Ref Range Status   SARS Coronavirus 2 by RT PCR NEGATIVE NEGATIVE Final    Comment: (NOTE) SARS-CoV-2 target nucleic acids are NOT DETECTED. The SARS-CoV-2 RNA is generally detectable in upper respiratoy specimens during  the acute phase of infection. The lowest concentration of SARS-CoV-2 viral copies this assay can detect is 131 copies/mL. A negative result does not preclude SARS-Cov-2 infection and should not be used as the sole basis for treatment or other patient management decisions. A negative result may occur with  improper specimen collection/handling, submission of specimen other than nasopharyngeal swab, presence of viral mutation(s) within the areas targeted by this assay, and inadequate number of viral copies (<131 copies/mL). A negative result must be combined with clinical observations, patient history, and epidemiological information. The expected result is Negative. Fact Sheet for Patients:  https://www.moore.com/ Fact Sheet for Healthcare Providers:  https://www.young.biz/ This test is not yet ap proved or cleared by the [place] FDA and  has been authorized for detection and/or diagnosis of SARS-CoV-2 by FDA under an Emergency Use Authorization (EUA).  This EUA will remain  in effect (meaning this test can be used) for the duration of the COVID-19 declaration under Section 564(b)(1) of the Act, 21 U.S.C. section 360bbb-3(b)(1), unless the authorization is terminated or revoked sooner.    Influenza A by PCR NEGATIVE NEGATIVE Final   Influenza B by PCR NEGATIVE NEGATIVE Final    Comment: (NOTE) The Xpert Xpress SARS-CoV-2/FLU/RSV assay is intended as an aid in  the diagnosis of influenza from Nasopharyngeal swab specimens and  should not be used as a sole basis for treatment. Nasal washings and  aspirates are unacceptable for Xpert Xpress SARS-CoV-2/FLU/RSV  testing. Fact Sheet for Patients: https://www.moore.com/ Fact Sheet for Healthcare Providers: https://www.young.biz/ This test is not yet approved or cleared by the [place] FDA and  has been authorized for detection and/or diagnosis of  SARS-CoV-2 by  FDA under an Emergency Use Authorization (EUA). This EUA will remain  in effect (meaning this test can be used) for the duration of the  Covid-19 declaration under Section 564(b)(1) of the Act, 21  U.S.C. section 360bbb-3(b)(1), unless the authorization is  terminated or revoked. Performed at [facility], [address] [place], [place] [address]      Labs: BNP (last 3 results) No results for input(s): BNP in the last 8760 hours. Basic Metabolic Panel: Recent Labs  Lab 01/13/20 1600 01/13/20 1823 01/14/20 0537 01/15/20 0444 01/16/20 0505  NA  --  135 140 140 137  K  --  3.9 3.8 3.8 3.9  CL  --  103 108 111 108  CO2  --  19* 22 18* 21*  GLUCOSE  --  120* 96 83 134*  BUN  --  19 18 16 9   CREATININE 1.70* 1.50* 1.32* 1.14 1.11  CALCIUM  --  8.8* 8.5* 8.6* 8.5*   Liver Function Tests: Recent Labs  Lab 01/13/20 1823 01/15/20 0444  AST 18 18  ALT 23 21  ALKPHOS 61 53  BILITOT 1.2 1.1  PROT 7.4 6.5  ALBUMIN 4.3 3.7   Recent Labs  Lab 01/13/20 1823  LIPASE 24   No results for input(s): AMMONIA in the last 168 hours. CBC: Recent Labs  Lab 01/13/20 1823 01/14/20 0537 01/15/20 0444  WBC 8.6 7.1 8.2  NEUTROABS 7.0  --   --   HGB 16.3 14.9 14.1  HCT 48.6 45.1 43.4  MCV 91.9 93.6 94.1  PLT 187 179 169   Cardiac Enzymes: No results for input(s): CKTOTAL, CKMB, CKMBINDEX, TROPONINI in the last 168 hours. BNP: Invalid input(s): POCBNP CBG: No results for input(s): GLUCAP in the last 168 hours. D-Dimer No results for input(s): DDIMER in the last 72 hours. Hgb A1c No results for input(s): HGBA1C in the last 72 hours. Lipid Profile No results for input(s): CHOL, HDL, LDLCALC, TRIG, CHOLHDL, LDLDIRECT in the last 72 hours. Thyroid function studies No results for input(s): TSH, T4TOTAL, T3FREE, THYROIDAB in the last 72 hours.  Invalid input(s): FREET3 Anemia work up No results for input(s): VITAMINB12, FOLATE, FERRITIN, TIBC, IRON,  RETICCTPCT in the last 72 hours. Urinalysis No results found for: COLORURINE, APPEARANCEUR, LABSPEC, PHURINE, GLUCOSEU, HGBUR, BILIRUBINUR, KETONESUR, PROTEINUR, UROBILINOGEN, NITRITE, LEUKOCYTESUR Sepsis Labs Invalid input(s): PROCALCITONIN,  WBC,  LACTICIDVEN Microbiology Recent Results (from the past 240 hour(s))  Respiratory Panel by RT PCR (Flu A&B, Covid) - Nasopharyngeal Swab     Status: None   Collection Time: 01/13/20  6:23 PM   Specimen: Nasopharyngeal Swab  Result Value Ref Range Status  SARS Coronavirus 2 by RT PCR NEGATIVE NEGATIVE Final    Comment: (NOTE) SARS-CoV-2 target nucleic acids are NOT DETECTED. The SARS-CoV-2 RNA is generally detectable in upper respiratoy specimens during the acute phase of infection. The lowest concentration of SARS-CoV-2 viral copies this assay can detect is 131 copies/mL. A negative result does not preclude SARS-Cov-2 infection and should not be used as the sole basis for treatment or other patient management decisions. A negative result may occur with  improper specimen collection/handling, submission of specimen other than nasopharyngeal swab, presence of viral mutation(s) within the areas targeted by this assay, and inadequate number of viral copies (<131 copies/mL). A negative result must be combined with clinical observations, patient history, and epidemiological information. The expected result is Negative. Fact Sheet for Patients:  https://www.moore.com/ Fact Sheet for Healthcare Providers:  https://www.young.biz/ This test is not yet ap proved or cleared by the [place] FDA and  has been authorized for detection and/or diagnosis of SARS-CoV-2 by FDA under an Emergency Use Authorization (EUA). This EUA will remain  in effect (meaning this test can be used) for the duration of the COVID-19 declaration under Section 564(b)(1) of the Act, 21 U.S.C. section 360bbb-3(b)(1), unless the  authorization is terminated or revoked sooner.    Influenza A by PCR NEGATIVE NEGATIVE Final   Influenza B by PCR NEGATIVE NEGATIVE Final    Comment: (NOTE) The Xpert Xpress SARS-CoV-2/FLU/RSV assay is intended as an aid in  the diagnosis of influenza from Nasopharyngeal swab specimens and  should not be used as a sole basis for treatment. Nasal washings and  aspirates are unacceptable for Xpert Xpress SARS-CoV-2/FLU/RSV  testing. Fact Sheet for Patients: https://www.moore.com/ Fact Sheet for Healthcare Providers: https://www.young.biz/ This test is not yet approved or cleared by the [place] FDA and  has been authorized for detection and/or diagnosis of SARS-CoV-2 by  FDA under an Emergency Use Authorization (EUA). This EUA will remain  in effect (meaning this test can be used) for the duration of the  Covid-19 declaration under Section 564(b)(1) of the Act, 21  U.S.C. section 360bbb-3(b)(1), unless the authorization is  terminated or revoked. Performed at [facility], [address] [place], [place] [address]     Time coordinating discharge: Over 30 minutes  SIGNED:  Arnetha Courser, MD  [facility] 01/16/2020, 2:16 PM  If 7PM-7AM, please contact night-coverage [email or web address]  This record has been created using Conservation officer, historic buildings. Errors have been sought and corrected,but may not always be located. Such creation errors do not reflect on the standard of care.

## 2020-01-16 NOTE — Plan of Care (Signed)
Discharge teaching completed and patient and spouse verbalize understanding of teaching, medications, and follow up information.  Patient is being discharged in stable condition and with all belongings.  Security contacted and bringing to patient his belongings in lock box.

## 2020-01-16 NOTE — Plan of Care (Signed)
Continuing with plan of care. 

## 2020-01-22 ENCOUNTER — Other Ambulatory Visit
Admission: RE | Admit: 2020-01-22 | Discharge: 2020-01-22 | Disposition: A | Discharge status: Home / Self Care | Payer: Medicare PPO | Attending: Internal Medicine | Admitting: Internal Medicine

## 2020-01-22 DIAGNOSIS — R197 Diarrhea, unspecified: Secondary | ICD-10-CM | POA: Insufficient documentation

## 2020-01-22 DIAGNOSIS — R1084 Generalized abdominal pain: Secondary | ICD-10-CM | POA: Insufficient documentation

## 2020-01-22 LAB — C DIFFICILE QUICK SCREEN W PCR REFLEX
C Diff antigen: NEGATIVE
C Diff interpretation: NOT DETECTED
C Diff toxin: NEGATIVE

## 2020-01-25 DIAGNOSIS — R1084 Generalized abdominal pain: Secondary | ICD-10-CM | POA: Diagnosis not present

## 2020-01-25 DIAGNOSIS — K219 Gastro-esophageal reflux disease without esophagitis: Secondary | ICD-10-CM | POA: Diagnosis not present

## 2020-01-25 DIAGNOSIS — R109 Unspecified abdominal pain: Secondary | ICD-10-CM | POA: Diagnosis not present

## 2020-01-25 DIAGNOSIS — R194 Change in bowel habit: Secondary | ICD-10-CM | POA: Diagnosis not present

## 2020-01-25 DIAGNOSIS — K56600 Partial intestinal obstruction, unspecified as to cause: Secondary | ICD-10-CM | POA: Diagnosis not present

## 2020-01-26 LAB — STOOL CULTURE: E coli, Shiga toxin Assay: NEGATIVE

## 2020-01-26 LAB — STOOL CULTURE REFLEX - CMPCXR

## 2020-01-26 LAB — STOOL CULTURE REFLEX - RSASHR

## 2020-01-27 LAB — GIARDIA, EIA; OVA/PARASITE: Giardia Ag, Stl: NEGATIVE

## 2020-01-27 LAB — O&P RESULT

## 2020-01-28 DIAGNOSIS — M109 Gout, unspecified: Secondary | ICD-10-CM | POA: Diagnosis not present

## 2020-01-28 DIAGNOSIS — E349 Endocrine disorder, unspecified: Secondary | ICD-10-CM | POA: Diagnosis not present

## 2020-01-28 LAB — CALPROTECTIN, FECAL: Calprotectin, Fecal: 661 ug/g — ABNORMAL HIGH (ref 0–120)

## 2020-03-02 ENCOUNTER — Other Ambulatory Visit: Payer: Self-pay | Admitting: Pulmonary Disease

## 2020-04-14 DIAGNOSIS — Z1211 Encounter for screening for malignant neoplasm of colon: Secondary | ICD-10-CM | POA: Diagnosis not present

## 2020-04-14 DIAGNOSIS — K56600 Partial intestinal obstruction, unspecified as to cause: Secondary | ICD-10-CM | POA: Diagnosis not present

## 2020-04-14 DIAGNOSIS — K219 Gastro-esophageal reflux disease without esophagitis: Secondary | ICD-10-CM | POA: Diagnosis not present

## 2020-06-03 DIAGNOSIS — Z01812 Encounter for preprocedural laboratory examination: Secondary | ICD-10-CM | POA: Diagnosis not present

## 2020-06-07 DIAGNOSIS — D120 Benign neoplasm of cecum: Secondary | ICD-10-CM | POA: Diagnosis not present

## 2020-06-07 DIAGNOSIS — K640 First degree hemorrhoids: Secondary | ICD-10-CM | POA: Diagnosis not present

## 2020-06-07 DIAGNOSIS — G4733 Obstructive sleep apnea (adult) (pediatric): Secondary | ICD-10-CM | POA: Diagnosis not present

## 2020-06-07 DIAGNOSIS — K219 Gastro-esophageal reflux disease without esophagitis: Secondary | ICD-10-CM | POA: Diagnosis not present

## 2020-06-07 DIAGNOSIS — K5730 Diverticulosis of large intestine without perforation or abscess without bleeding: Secondary | ICD-10-CM | POA: Diagnosis not present

## 2020-06-07 DIAGNOSIS — K621 Rectal polyp: Secondary | ICD-10-CM | POA: Diagnosis not present

## 2020-06-07 DIAGNOSIS — Z1211 Encounter for screening for malignant neoplasm of colon: Secondary | ICD-10-CM | POA: Diagnosis not present

## 2020-06-07 DIAGNOSIS — K635 Polyp of colon: Secondary | ICD-10-CM | POA: Diagnosis not present

## 2020-06-07 DIAGNOSIS — I10 Essential (primary) hypertension: Secondary | ICD-10-CM | POA: Diagnosis not present

## 2020-06-07 DIAGNOSIS — D126 Benign neoplasm of colon, unspecified: Secondary | ICD-10-CM | POA: Diagnosis not present

## 2020-06-15 DIAGNOSIS — M109 Gout, unspecified: Secondary | ICD-10-CM | POA: Diagnosis not present

## 2020-06-15 DIAGNOSIS — G4733 Obstructive sleep apnea (adult) (pediatric): Secondary | ICD-10-CM | POA: Diagnosis not present

## 2020-06-15 DIAGNOSIS — I10 Essential (primary) hypertension: Secondary | ICD-10-CM | POA: Diagnosis not present

## 2020-06-15 DIAGNOSIS — Z6841 Body Mass Index (BMI) 40.0 and over, adult: Secondary | ICD-10-CM | POA: Diagnosis not present

## 2020-06-15 DIAGNOSIS — R0602 Shortness of breath: Secondary | ICD-10-CM | POA: Diagnosis not present

## 2020-06-15 DIAGNOSIS — Z79899 Other long term (current) drug therapy: Secondary | ICD-10-CM | POA: Diagnosis not present

## 2020-06-15 DIAGNOSIS — Z1331 Encounter for screening for depression: Secondary | ICD-10-CM | POA: Diagnosis not present

## 2020-06-15 DIAGNOSIS — I5030 Unspecified diastolic (congestive) heart failure: Secondary | ICD-10-CM | POA: Diagnosis not present

## 2020-06-15 DIAGNOSIS — R7303 Prediabetes: Secondary | ICD-10-CM | POA: Diagnosis not present

## 2020-06-15 DIAGNOSIS — E349 Endocrine disorder, unspecified: Secondary | ICD-10-CM | POA: Diagnosis not present

## 2020-09-01 DIAGNOSIS — Z23 Encounter for immunization: Secondary | ICD-10-CM | POA: Diagnosis not present

## 2020-11-14 ENCOUNTER — Other Ambulatory Visit: Payer: Self-pay | Admitting: Pulmonary Disease

## 2021-01-04 DIAGNOSIS — E349 Endocrine disorder, unspecified: Secondary | ICD-10-CM | POA: Diagnosis not present

## 2021-01-04 DIAGNOSIS — G4733 Obstructive sleep apnea (adult) (pediatric): Secondary | ICD-10-CM | POA: Diagnosis not present

## 2021-01-04 DIAGNOSIS — I5030 Unspecified diastolic (congestive) heart failure: Secondary | ICD-10-CM | POA: Diagnosis not present

## 2021-01-04 DIAGNOSIS — I10 Essential (primary) hypertension: Secondary | ICD-10-CM | POA: Diagnosis not present

## 2021-01-04 DIAGNOSIS — Z79899 Other long term (current) drug therapy: Secondary | ICD-10-CM | POA: Diagnosis not present

## 2021-01-04 DIAGNOSIS — R0602 Shortness of breath: Secondary | ICD-10-CM | POA: Diagnosis not present

## 2021-01-04 DIAGNOSIS — M109 Gout, unspecified: Secondary | ICD-10-CM | POA: Diagnosis not present

## 2021-01-04 DIAGNOSIS — N529 Male erectile dysfunction, unspecified: Secondary | ICD-10-CM | POA: Diagnosis not present

## 2021-01-04 DIAGNOSIS — Z125 Encounter for screening for malignant neoplasm of prostate: Secondary | ICD-10-CM | POA: Diagnosis not present

## 2021-01-04 DIAGNOSIS — R7303 Prediabetes: Secondary | ICD-10-CM | POA: Diagnosis not present

## 2021-02-15 ENCOUNTER — Other Ambulatory Visit: Payer: Self-pay | Admitting: Pulmonary Disease

## 2021-06-12 ENCOUNTER — Other Ambulatory Visit: Payer: Self-pay

## 2021-06-12 ENCOUNTER — Emergency Department: Payer: Medicare PPO

## 2021-06-12 ENCOUNTER — Emergency Department
Admission: EM | Admit: 2021-06-12 | Discharge: 2021-06-12 | Disposition: A | Discharge status: Other Acute Inpatient Hospital | Payer: Medicare PPO | Attending: Emergency Medicine | Admitting: Emergency Medicine

## 2021-06-12 DIAGNOSIS — J690 Pneumonitis due to inhalation of food and vomit: Secondary | ICD-10-CM | POA: Diagnosis not present

## 2021-06-12 DIAGNOSIS — M47814 Spondylosis without myelopathy or radiculopathy, thoracic region: Secondary | ICD-10-CM | POA: Diagnosis not present

## 2021-06-12 DIAGNOSIS — I10 Essential (primary) hypertension: Secondary | ICD-10-CM | POA: Insufficient documentation

## 2021-06-12 DIAGNOSIS — Z743 Need for continuous supervision: Secondary | ICD-10-CM | POA: Diagnosis not present

## 2021-06-12 DIAGNOSIS — T17908A Unspecified foreign body in respiratory tract, part unspecified causing other injury, initial encounter: Secondary | ICD-10-CM

## 2021-06-12 DIAGNOSIS — T189XXA Foreign body of alimentary tract, part unspecified, initial encounter: Secondary | ICD-10-CM | POA: Diagnosis not present

## 2021-06-12 DIAGNOSIS — X58XXXA Exposure to other specified factors, initial encounter: Secondary | ICD-10-CM | POA: Insufficient documentation

## 2021-06-12 DIAGNOSIS — Z79899 Other long term (current) drug therapy: Secondary | ICD-10-CM | POA: Insufficient documentation

## 2021-06-12 DIAGNOSIS — J9859 Other diseases of mediastinum, not elsewhere classified: Secondary | ICD-10-CM | POA: Diagnosis not present

## 2021-06-12 DIAGNOSIS — R0600 Dyspnea, unspecified: Secondary | ICD-10-CM | POA: Diagnosis not present

## 2021-06-12 DIAGNOSIS — Z20822 Contact with and (suspected) exposure to covid-19: Secondary | ICD-10-CM | POA: Diagnosis not present

## 2021-06-12 DIAGNOSIS — Z87891 Personal history of nicotine dependence: Secondary | ICD-10-CM | POA: Insufficient documentation

## 2021-06-12 DIAGNOSIS — Z96641 Presence of right artificial hip joint: Secondary | ICD-10-CM | POA: Insufficient documentation

## 2021-06-12 DIAGNOSIS — Z6839 Body mass index (BMI) 39.0-39.9, adult: Secondary | ICD-10-CM | POA: Diagnosis not present

## 2021-06-12 DIAGNOSIS — T17500A Unspecified foreign body in bronchus causing asphyxiation, initial encounter: Secondary | ICD-10-CM | POA: Diagnosis not present

## 2021-06-12 DIAGNOSIS — J45909 Unspecified asthma, uncomplicated: Secondary | ICD-10-CM | POA: Diagnosis not present

## 2021-06-12 DIAGNOSIS — I700 Atherosclerosis of aorta: Secondary | ICD-10-CM | POA: Diagnosis not present

## 2021-06-12 DIAGNOSIS — G4733 Obstructive sleep apnea (adult) (pediatric): Secondary | ICD-10-CM | POA: Diagnosis not present

## 2021-06-12 DIAGNOSIS — I2510 Atherosclerotic heart disease of native coronary artery without angina pectoris: Secondary | ICD-10-CM | POA: Diagnosis not present

## 2021-06-12 DIAGNOSIS — E669 Obesity, unspecified: Secondary | ICD-10-CM | POA: Diagnosis not present

## 2021-06-12 DIAGNOSIS — T17528A Food in bronchus causing other injury, initial encounter: Secondary | ICD-10-CM | POA: Diagnosis not present

## 2021-06-12 LAB — COMPREHENSIVE METABOLIC PANEL
ALT: 31 U/L (ref 0–44)
AST: 29 U/L (ref 15–41)
Albumin: 4.1 g/dL (ref 3.5–5.0)
Alkaline Phosphatase: 82 U/L (ref 38–126)
Anion gap: 9 (ref 5–15)
BUN: 17 mg/dL (ref 8–23)
CO2: 24 mmol/L (ref 22–32)
Calcium: 9.2 mg/dL (ref 8.9–10.3)
Chloride: 107 mmol/L (ref 98–111)
Creatinine, Ser: 1.2 mg/dL (ref 0.61–1.24)
GFR, Estimated: >60 mL/min (ref >60)
Glucose, Bld: 119 mg/dL — ABNORMAL HIGH (ref 70–99)
Potassium: 3.7 mmol/L (ref 3.5–5.1)
Sodium: 140 mmol/L (ref 135–145)
Total Bilirubin: 0.7 mg/dL (ref 0.3–1.2)
Total Protein: 7.1 g/dL (ref 6.5–8.1)

## 2021-06-12 LAB — RESP PANEL BY RT-PCR (FLU A&B, COVID) ARPGX2
Influenza A by PCR: NEGATIVE
Influenza B by PCR: NEGATIVE
SARS Coronavirus 2 by RT PCR: NEGATIVE

## 2021-06-12 LAB — CBC
HCT: 39.5 % (ref 39.0–52.0)
Hemoglobin: 13.7 g/dL (ref 13.0–17.0)
MCH: 32.5 pg (ref 26.0–34.0)
MCHC: 34.7 g/dL (ref 30.0–36.0)
MCV: 93.6 fL (ref 80.0–100.0)
Platelets: 130 10*3/uL — ABNORMAL LOW (ref 150–400)
RBC: 4.22 MIL/uL (ref 4.22–5.81)
RDW: 13.5 % (ref 11.5–15.5)
WBC: 5.4 10*3/uL (ref 4.0–10.5)
nRBC: 0 % (ref 0.0–0.2)

## 2021-06-12 NOTE — ED Notes (Addendum)
Report given to [facility] transport and Charity fundraiser for [phone] at [facility].

## 2021-06-12 NOTE — ED Provider Notes (Signed)
[facility] Emergency Department Provider Note  Time seen: 7:32 PM  I have reviewed the triage vital signs and the nursing notes.   HISTORY  Chief Complaint Foreign Body   HPI Walter Anderson is a 75 y.o. male with a past medical history of arthritis, hypertension, presents to the emergency department for possible aspiration.  According to the patient around 3:30 PM today he was eating cherries and he believes he may have aspirated on one of the cherry pits.  Patient states since that time he has been coughing and feels like there is something stuck in his right lung.  Denies any trouble swallowing denies any trouble breathing.  Denies any other complaints currently.    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   Cholecystitis, acute with cholelithiasis 11/16/2015    Past Surgical History:  Procedure Laterality Date   BACK SURGERY  2006   CHOLECYSTECTOMY N/A 11/15/2015   Procedure: LAPAROSCOPIC CHOLECYSTECTOMY WITH INTRAOPERATIVE CHOLANGIOGRAM;  Surgeon: Kieth Brightly, MD;  Location: [facility] ORS;  Service: General;  Laterality: N/A;   FINGER ARTHROSCOPY  1990   3rd Finger Left Hand   ROTATOR CUFF REPAIR     right shoulder   TONSILLECTOMY  75 years old   TOTAL HIP ARTHROPLASTY Right 04/01/2018   Procedure: RIGHT TOTAL HIP ARTHROPLASTY ANTERIOR APPROACH;  Surgeon: Durene Romans, MD;  Location: [facility] ORS;  Service: Orthopedics;  Laterality: Right;  70 mins   UMBILICAL HERNIA REPAIR  11/15/2015   Procedure: HERNIA REPAIR UMBILICAL ADULT;  Surgeon: Kieth Brightly, MD;  Location: [facility] ORS;  Service: General;;    Prior to Admission medications   Medication Sig Start Date End Date Taking? Authorizing Provider   albuterol (PROVENTIL HFA;VENTOLIN HFA) 108 (90 Base) MCG/ACT inhaler Inhale 1-2 puffs into the lungs every 6 (six) hours as needed for wheezing or shortness of breath.    [provider]  allopurinol (ZYLOPRIM) 100 MG tablet Take 100 mg by mouth daily.  12/16/19   [provider]  amLODipine (NORVASC) 2.5 MG tablet Take 2.5 mg by mouth daily. 07/28/19   [provider]  BREO ELLIPTA 100-25 MCG/INH AEPB TAKE 1 PUFF BY MOUTH EVERY DAY 11/14/20   Salena Saner, MD  Bromelains (BROMELAIN PO) Take 240 mg by mouth daily.    [provider]  Coenzyme Q10 400 MG CAPS Take 400 mg by mouth daily.     [provider]  colchicine 0.6 MG tablet Take 1.2 mg by mouth daily as needed (gout).     [provider]  docusate sodium (COLACE) 100 MG capsule Take 1 capsule (100 mg total) by mouth 2 (two) times daily. Patient not taking: Reported on 01/14/2020 04/01/18   Lanney Gins, PA-C  doxazosin (CARDURA) 4 MG tablet Take 4 mg by mouth 2 (two) times daily.     [provider]  ferrous sulfate (FERROUSUL) 325 (65 FE) MG tablet Take 1 tablet (325 mg total) by mouth 3 (three) times daily with meals. Patient not taking: Reported on 01/14/2020 04/01/18   Lanney Gins, PA-C  furosemide (LASIX) 40 MG tablet Take 40 mg by mouth daily as needed.  [provider]  magnesium gluconate (MAGONATE) 500 MG tablet Take 500 mg by mouth 2 (two) times daily.    [provider]  metoprolol succinate (TOPROL-XL) 100 MG 24 hr tablet Take 100 mg by mouth daily.  11/04/15   [provider]  Multiple Vitamin (MULTIVITAMIN WITH MINERALS) TABS tablet Take 1 tablet by mouth daily.    [provider]  niacin 500 MG tablet Take 500 mg by mouth at bedtime.    [provider]  omega-3 acid ethyl esters (LOVAZA) 1 g capsule Take 2 g by mouth daily.    [provider]  potassium chloride SA (K-DUR,KLOR-CON) 20 MEQ tablet Take  1 tablet by mouth daily. 11/10/15   [provider]  psyllium (HYDROCIL/METAMUCIL) 95 % PACK Take 2 packets by mouth daily.    [provider]  testosterone (ANDROGEL) 50 MG/5GM (1%) GEL Place 10 g onto the skin daily.     [provider]  TURMERIC PO Take 750 mg by mouth daily.    [provider]  valsartan (DIOVAN) 320 MG tablet Take 320 mg by mouth daily.     [provider]    No Known Allergies  Family History  Problem Relation Age of Onset   Diabetes Mother    COPD Father    Heart disease Father     Social History Social History   Tobacco Use   Smoking status: Never   Smokeless tobacco: Former    Types: Chew    Quit date: 11/15/2005  Vaping Use   Vaping Use: Never used  Substance Use Topics   Alcohol use: Yes    Comment: 1-2 Weekly   Drug use: No    Review of Systems Constitutional: Negative for fever. Cardiovascular: Negative for chest pain. Respiratory: Negative for shortness of breath.  Positive for cough.   Gastrointestinal: Negative for abdominal pain, vomiting and diarrhea. Genitourinary: Negative for urinary compaints Musculoskeletal: Negative for musculoskeletal complaints Neurological: Negative for headache All other ROS negative  ____________________________________________   PHYSICAL EXAM:  VITAL SIGNS: ED Triage Vitals  Enc Vitals Group     BP 06/12/21 1733 (!) 169/89     Pulse Rate 06/12/21 1733 86     Resp 06/12/21 1733 20     Temp 06/12/21 1733 98.5 F (36.9 C)     Temp Source 06/12/21 1733 Oral     SpO2 06/12/21 1733 98 %     Weight 06/12/21 1734 290 lb (131.5 kg)     Height 06/12/21 1734 6' (1.829 m)     Head Circumference --      Peak Flow --      Pain Score 06/12/21 1734 0     Pain Loc --      Pain Edu? --      Excl. in GC? --    Constitutional: Alert and oriented. Well appearing and in no distress. Eyes: Normal exam ENT      Head: Normocephalic and atraumatic.       Mouth/Throat: Mucous membranes are moist. Cardiovascular: Normal rate, regular rhythm.  Respiratory: Normal respiratory effort without tachypnea nor retractions.  Slight expiratory wheeze in the right lung fields. Gastrointestinal: Soft and nontender. No distention.   Musculoskeletal: Nontender with normal range of motion in all extremities.  Neurologic:  Normal speech and language. No gross focal neurologic deficits  Skin:  Skin is warm, dry and intact.  Psychiatric: Mood and affect are normal.      RADIOLOGY  Chest  x-ray is negative  ____________________________________________   INITIAL IMPRESSION / ASSESSMENT AND PLAN / ED COURSE  Pertinent labs & imaging results that were available during my care of the patient were reviewed by me and considered in my medical decision making (see chart for details).   Patient presents emergency department for possible aspiration.  Patient states he was eating cherries when he believes he may have aspirated on a cherry pit.  Chest x-ray is negative.  Does have slight wheeze in the right lung fields.  We will obtain a CT scan without contrast to further evaluate.  Patient agreeable plan.  CT confirms what appears to be a cherry seed within the right bronchus intermedius.  Spoke to pulmonology who recommends ENT consultation for rigid bronchoscope.  I spoke to ENT and they do not feel they have the equipment to perform the rigid bronchoscope and states that has been greater than 15 years since his last rigid bronchoscope recommend transferring to a tertiary care center such as [facility].  I spoke to [facility] Dr. Gordan Payment of ENT and they have accepted to their service for rigid bronchoscopy.  We will PowerShare images and await bed assignment.  PJ ZEHNER was evaluated in Emergency Department on 06/12/2021 for the symptoms described in the history of present illness. He was evaluated in the context of the global COVID-19 pandemic, which necessitated consideration  that the patient might be at risk for infection with the SARS-CoV-2 virus that causes COVID-19. Institutional protocols and algorithms that pertain to the evaluation of patients at risk for COVID-19 are in a state of rapid change based on information released by regulatory bodies including the CDC and federal and state organizations. These policies and algorithms were followed during the patient's care in the ED.  ____________________________________________   FINAL CLINICAL IMPRESSION(S) / ED DIAGNOSES  Aspiration   Minna Antis, MD 06/12/21 2106

## 2021-06-12 NOTE — ED Triage Notes (Signed)
Pt was eating cherries today and thinks he aspirated the seed into his lungs.  Pt coughing in triage.  Pt talking in complete sentences.  No drooling.  Pt alert  speech clear.

## 2021-06-12 NOTE — ED Notes (Signed)
Called [facility] transfer Walter Anderson) for ENT per Lenard Lance, MD

## 2021-06-12 NOTE — ED Notes (Signed)
[facility] for transport to [facility]

## 2021-06-12 NOTE — ED Notes (Signed)
Face sheet faxed and Images Power-shared

## 2021-06-13 DIAGNOSIS — T17900A Unspecified foreign body in respiratory tract, part unspecified causing asphyxiation, initial encounter: Secondary | ICD-10-CM | POA: Diagnosis not present

## 2021-06-13 DIAGNOSIS — G4733 Obstructive sleep apnea (adult) (pediatric): Secondary | ICD-10-CM | POA: Diagnosis not present

## 2021-06-13 DIAGNOSIS — R0600 Dyspnea, unspecified: Secondary | ICD-10-CM | POA: Diagnosis not present

## 2021-06-13 DIAGNOSIS — Z6839 Body mass index (BMI) 39.0-39.9, adult: Secondary | ICD-10-CM | POA: Diagnosis not present

## 2021-06-13 DIAGNOSIS — Z87891 Personal history of nicotine dependence: Secondary | ICD-10-CM | POA: Diagnosis not present

## 2021-06-13 DIAGNOSIS — T17528A Food in bronchus causing other injury, initial encounter: Secondary | ICD-10-CM | POA: Diagnosis not present

## 2021-06-13 DIAGNOSIS — I10 Essential (primary) hypertension: Secondary | ICD-10-CM | POA: Diagnosis not present

## 2021-06-13 DIAGNOSIS — E669 Obesity, unspecified: Secondary | ICD-10-CM | POA: Diagnosis not present

## 2021-06-13 DIAGNOSIS — Z9989 Dependence on other enabling machines and devices: Secondary | ICD-10-CM | POA: Diagnosis not present

## 2021-06-14 DIAGNOSIS — T17528A Food in bronchus causing other injury, initial encounter: Secondary | ICD-10-CM | POA: Diagnosis not present

## 2021-06-14 DIAGNOSIS — T17598A Other foreign object in bronchus causing other injury, initial encounter: Secondary | ICD-10-CM | POA: Diagnosis not present

## 2021-06-14 DIAGNOSIS — G4733 Obstructive sleep apnea (adult) (pediatric): Secondary | ICD-10-CM | POA: Diagnosis not present

## 2021-06-14 DIAGNOSIS — E669 Obesity, unspecified: Secondary | ICD-10-CM | POA: Diagnosis not present

## 2021-06-14 DIAGNOSIS — T17900A Unspecified foreign body in respiratory tract, part unspecified causing asphyxiation, initial encounter: Secondary | ICD-10-CM | POA: Diagnosis not present

## 2021-06-14 DIAGNOSIS — T18128A Food in esophagus causing other injury, initial encounter: Secondary | ICD-10-CM | POA: Diagnosis not present

## 2021-06-14 DIAGNOSIS — I10 Essential (primary) hypertension: Secondary | ICD-10-CM | POA: Diagnosis not present

## 2021-06-14 DIAGNOSIS — R0600 Dyspnea, unspecified: Secondary | ICD-10-CM | POA: Diagnosis not present

## 2021-06-14 DIAGNOSIS — Z6839 Body mass index (BMI) 39.0-39.9, adult: Secondary | ICD-10-CM | POA: Diagnosis not present

## 2021-06-14 DIAGNOSIS — T17908A Unspecified foreign body in respiratory tract, part unspecified causing other injury, initial encounter: Secondary | ICD-10-CM | POA: Diagnosis not present

## 2021-06-14 DIAGNOSIS — Z87891 Personal history of nicotine dependence: Secondary | ICD-10-CM | POA: Diagnosis not present

## 2021-07-05 DIAGNOSIS — Z9181 History of falling: Secondary | ICD-10-CM | POA: Diagnosis not present

## 2021-07-05 DIAGNOSIS — I5030 Unspecified diastolic (congestive) heart failure: Secondary | ICD-10-CM | POA: Diagnosis not present

## 2021-07-05 DIAGNOSIS — Z79899 Other long term (current) drug therapy: Secondary | ICD-10-CM | POA: Diagnosis not present

## 2021-07-05 DIAGNOSIS — I10 Essential (primary) hypertension: Secondary | ICD-10-CM | POA: Diagnosis not present

## 2021-07-05 DIAGNOSIS — Z139 Encounter for screening, unspecified: Secondary | ICD-10-CM | POA: Diagnosis not present

## 2021-07-05 DIAGNOSIS — Z1331 Encounter for screening for depression: Secondary | ICD-10-CM | POA: Diagnosis not present

## 2021-07-05 DIAGNOSIS — R0602 Shortness of breath: Secondary | ICD-10-CM | POA: Diagnosis not present

## 2021-07-05 DIAGNOSIS — E349 Endocrine disorder, unspecified: Secondary | ICD-10-CM | POA: Diagnosis not present

## 2021-07-05 DIAGNOSIS — M109 Gout, unspecified: Secondary | ICD-10-CM | POA: Diagnosis not present

## 2021-07-05 DIAGNOSIS — R7303 Prediabetes: Secondary | ICD-10-CM | POA: Diagnosis not present

## 2021-07-05 DIAGNOSIS — G4733 Obstructive sleep apnea (adult) (pediatric): Secondary | ICD-10-CM | POA: Diagnosis not present

## 2021-08-14 DIAGNOSIS — Z1331 Encounter for screening for depression: Secondary | ICD-10-CM | POA: Diagnosis not present

## 2021-08-14 DIAGNOSIS — E669 Obesity, unspecified: Secondary | ICD-10-CM | POA: Diagnosis not present

## 2021-08-14 DIAGNOSIS — E785 Hyperlipidemia, unspecified: Secondary | ICD-10-CM | POA: Diagnosis not present

## 2021-08-14 DIAGNOSIS — Z0000 Encounter for general adult medical examination without abnormal findings: Secondary | ICD-10-CM | POA: Diagnosis not present

## 2021-08-14 DIAGNOSIS — Z23 Encounter for immunization: Secondary | ICD-10-CM | POA: Diagnosis not present

## 2021-08-14 DIAGNOSIS — Z6841 Body Mass Index (BMI) 40.0 and over, adult: Secondary | ICD-10-CM | POA: Diagnosis not present

## 2021-08-14 DIAGNOSIS — Z9181 History of falling: Secondary | ICD-10-CM | POA: Diagnosis not present

## 2021-09-11 ENCOUNTER — Inpatient Hospital Stay (HOSPITAL_COMMUNITY)
Admission: EM | Disposition: A | Discharge status: Home / Self Care | Payer: Self-pay | Source: Home / Self Care | Attending: Cardiovascular Disease

## 2021-09-11 ENCOUNTER — Inpatient Hospital Stay (HOSPITAL_COMMUNITY)
Admission: EM | Admit: 2021-09-11 | Discharge: 2021-09-13 | DRG: 247 | Disposition: A | Discharge status: Home / Self Care | Payer: Medicare PPO | Attending: Cardiovascular Disease | Admitting: Cardiovascular Disease

## 2021-09-11 ENCOUNTER — Encounter (HOSPITAL_COMMUNITY): Payer: Self-pay | Admitting: Cardiovascular Disease

## 2021-09-11 ENCOUNTER — Other Ambulatory Visit (HOSPITAL_COMMUNITY): Payer: Self-pay

## 2021-09-11 ENCOUNTER — Ambulatory Visit (HOSPITAL_COMMUNITY): Admit: 2021-09-11 | Payer: Medicare PPO | Admitting: Cardiovascular Disease

## 2021-09-11 ENCOUNTER — Other Ambulatory Visit: Payer: Self-pay

## 2021-09-11 DIAGNOSIS — I2102 ST elevation (STEMI) myocardial infarction involving left anterior descending coronary artery: Principal | ICD-10-CM | POA: Diagnosis present

## 2021-09-11 DIAGNOSIS — I255 Ischemic cardiomyopathy: Secondary | ICD-10-CM | POA: Diagnosis not present

## 2021-09-11 DIAGNOSIS — J45909 Unspecified asthma, uncomplicated: Secondary | ICD-10-CM | POA: Diagnosis not present

## 2021-09-11 DIAGNOSIS — Z79899 Other long term (current) drug therapy: Secondary | ICD-10-CM | POA: Diagnosis not present

## 2021-09-11 DIAGNOSIS — Z20822 Contact with and (suspected) exposure to covid-19: Secondary | ICD-10-CM | POA: Diagnosis present

## 2021-09-11 DIAGNOSIS — I213 ST elevation (STEMI) myocardial infarction of unspecified site: Secondary | ICD-10-CM | POA: Diagnosis not present

## 2021-09-11 DIAGNOSIS — Z8249 Family history of ischemic heart disease and other diseases of the circulatory system: Secondary | ICD-10-CM

## 2021-09-11 DIAGNOSIS — R0789 Other chest pain: Secondary | ICD-10-CM | POA: Diagnosis not present

## 2021-09-11 DIAGNOSIS — G4733 Obstructive sleep apnea (adult) (pediatric): Secondary | ICD-10-CM | POA: Diagnosis not present

## 2021-09-11 DIAGNOSIS — Z833 Family history of diabetes mellitus: Secondary | ICD-10-CM | POA: Diagnosis not present

## 2021-09-11 DIAGNOSIS — E782 Mixed hyperlipidemia: Secondary | ICD-10-CM | POA: Diagnosis not present

## 2021-09-11 DIAGNOSIS — Z955 Presence of coronary angioplasty implant and graft: Secondary | ICD-10-CM

## 2021-09-11 DIAGNOSIS — Z87891 Personal history of nicotine dependence: Secondary | ICD-10-CM | POA: Diagnosis not present

## 2021-09-11 DIAGNOSIS — E785 Hyperlipidemia, unspecified: Secondary | ICD-10-CM

## 2021-09-11 DIAGNOSIS — R231 Pallor: Secondary | ICD-10-CM | POA: Diagnosis not present

## 2021-09-11 DIAGNOSIS — R0902 Hypoxemia: Secondary | ICD-10-CM | POA: Diagnosis not present

## 2021-09-11 DIAGNOSIS — I10 Essential (primary) hypertension: Secondary | ICD-10-CM | POA: Diagnosis not present

## 2021-09-11 DIAGNOSIS — Z96641 Presence of right artificial hip joint: Secondary | ICD-10-CM | POA: Diagnosis present

## 2021-09-11 DIAGNOSIS — R079 Chest pain, unspecified: Secondary | ICD-10-CM | POA: Diagnosis not present

## 2021-09-11 DIAGNOSIS — Z823 Family history of stroke: Secondary | ICD-10-CM | POA: Diagnosis not present

## 2021-09-11 DIAGNOSIS — E669 Obesity, unspecified: Secondary | ICD-10-CM | POA: Diagnosis present

## 2021-09-11 DIAGNOSIS — Z6839 Body mass index (BMI) 39.0-39.9, adult: Secondary | ICD-10-CM

## 2021-09-11 DIAGNOSIS — R9431 Abnormal electrocardiogram [ECG] [EKG]: Secondary | ICD-10-CM | POA: Diagnosis not present

## 2021-09-11 DIAGNOSIS — I2510 Atherosclerotic heart disease of native coronary artery without angina pectoris: Secondary | ICD-10-CM | POA: Diagnosis not present

## 2021-09-11 DIAGNOSIS — Z825 Family history of asthma and other chronic lower respiratory diseases: Secondary | ICD-10-CM

## 2021-09-11 HISTORY — PX: LEFT HEART CATH AND CORONARY ANGIOGRAPHY: CATH118249

## 2021-09-11 HISTORY — PX: CORONARY/GRAFT ACUTE MI REVASCULARIZATION: CATH118305

## 2021-09-11 LAB — CBC WITH DIFFERENTIAL/PLATELET
Abs Immature Granulocytes: 0.05 10*3/uL (ref 0.00–0.07)
Basophils Absolute: 0 10*3/uL (ref 0.0–0.1)
Basophils Relative: 0 %
Eosinophils Absolute: 0 10*3/uL (ref 0.0–0.5)
Eosinophils Relative: 0 %
HCT: 40.2 % (ref 39.0–52.0)
Hemoglobin: 13.5 g/dL (ref 13.0–17.0)
Immature Granulocytes: 1 %
Lymphocytes Relative: 9 %
Lymphs Abs: 0.8 10*3/uL (ref 0.7–4.0)
MCH: 31.7 pg (ref 26.0–34.0)
MCHC: 33.6 g/dL (ref 30.0–36.0)
MCV: 94.4 fL (ref 80.0–100.0)
Monocytes Absolute: 0.4 10*3/uL (ref 0.1–1.0)
Monocytes Relative: 4 %
Neutro Abs: 7.2 10*3/uL (ref 1.7–7.7)
Neutrophils Relative %: 86 %
Platelets: 133 10*3/uL — ABNORMAL LOW (ref 150–400)
RBC: 4.26 MIL/uL (ref 4.22–5.81)
RDW: 14.6 % (ref 11.5–15.5)
WBC: 8.5 10*3/uL (ref 4.0–10.5)
nRBC: 0 % (ref 0.0–0.2)

## 2021-09-11 LAB — TROPONIN I (HIGH SENSITIVITY)
Troponin I (High Sensitivity): 66 ng/L — ABNORMAL HIGH (ref <18)
Troponin I (High Sensitivity): >24000 ng/L (ref <18)

## 2021-09-11 LAB — POCT I-STAT, CHEM 8
BUN: 13 mg/dL (ref 8–23)
Calcium, Ion: 1.25 mmol/L (ref 1.15–1.40)
Chloride: 110 mmol/L (ref 98–111)
Creatinine, Ser: 0.8 mg/dL (ref 0.61–1.24)
Glucose, Bld: 163 mg/dL — ABNORMAL HIGH (ref 70–99)
HCT: 39 % (ref 39.0–52.0)
Hemoglobin: 13.3 g/dL (ref 13.0–17.0)
Potassium: 4.2 mmol/L (ref 3.5–5.1)
Sodium: 142 mmol/L (ref 135–145)
TCO2: 23 mmol/L (ref 22–32)

## 2021-09-11 LAB — HEMOGLOBIN A1C
Hgb A1c MFr Bld: 5.5 % (ref 4.8–5.6)
Mean Plasma Glucose: 111.15 mg/dL

## 2021-09-11 LAB — COMPREHENSIVE METABOLIC PANEL
ALT: 20 U/L (ref 0–44)
AST: 18 U/L (ref 15–41)
Albumin: 3.6 g/dL (ref 3.5–5.0)
Alkaline Phosphatase: 71 U/L (ref 38–126)
Anion gap: 10 (ref 5–15)
BUN: 13 mg/dL (ref 8–23)
CO2: 19 mmol/L — ABNORMAL LOW (ref 22–32)
Calcium: 8.7 mg/dL — ABNORMAL LOW (ref 8.9–10.3)
Chloride: 108 mmol/L (ref 98–111)
Creatinine, Ser: 0.89 mg/dL (ref 0.61–1.24)
GFR, Estimated: >60 mL/min (ref >60)
Glucose, Bld: 164 mg/dL — ABNORMAL HIGH (ref 70–99)
Potassium: 4.1 mmol/L (ref 3.5–5.1)
Sodium: 137 mmol/L (ref 135–145)
Total Bilirubin: 0.7 mg/dL (ref 0.3–1.2)
Total Protein: 6.2 g/dL — ABNORMAL LOW (ref 6.5–8.1)

## 2021-09-11 LAB — PROTIME-INR
INR: 1.1 (ref 0.8–1.2)
Prothrombin Time: 14.4 seconds (ref 11.4–15.2)

## 2021-09-11 LAB — MRSA NEXT GEN BY PCR, NASAL: MRSA by PCR Next Gen: NOT DETECTED

## 2021-09-11 LAB — RESP PANEL BY RT-PCR (FLU A&B, COVID) ARPGX2
Influenza A by PCR: NEGATIVE
Influenza B by PCR: NEGATIVE
SARS Coronavirus 2 by RT PCR: NEGATIVE

## 2021-09-11 LAB — POCT ACTIVATED CLOTTING TIME
Activated Clotting Time: 242 seconds
Activated Clotting Time: 254 seconds
Activated Clotting Time: 289 seconds

## 2021-09-11 LAB — APTT: aPTT: 27 seconds (ref 24–36)

## 2021-09-11 SURGERY — LEFT HEART CATH AND CORONARY ANGIOGRAPHY
Anesthesia: LOCAL

## 2021-09-11 MED ORDER — LIDOCAINE HCL (PF) 1 % IJ SOLN
INTRAMUSCULAR | Status: DC | PRN
  Administered 2021-09-11: 2 mL via SUBCUTANEOUS

## 2021-09-11 MED ORDER — CANGRELOR BOLUS VIA INFUSION
INTRAVENOUS | Status: DC | PRN
  Administered 2021-09-11: 3945 ug via INTRAVENOUS

## 2021-09-11 MED ORDER — ASPIRIN EC 81 MG PO TBEC
81.0000 mg | DELAYED_RELEASE_TABLET | Freq: Every day | ORAL | Status: DC
Start: 2021-09-12 — End: 2021-09-11

## 2021-09-11 MED ORDER — ALBUTEROL SULFATE HFA 108 (90 BASE) MCG/ACT IN AERS: 1.0000 | INHALATION_SPRAY | Freq: Four times a day (QID) | RESPIRATORY_TRACT | Status: DC | PRN

## 2021-09-11 MED ORDER — SODIUM CHLORIDE 0.9% FLUSH
3.0000 mL | Freq: Two times a day (BID) | INTRAVENOUS | Status: DC
  Administered 2021-09-11 – 2021-09-13 (×4): 3 mL via INTRAVENOUS

## 2021-09-11 MED ORDER — TICAGRELOR 90 MG PO TABS
ORAL_TABLET | ORAL | Status: DC | PRN
  Administered 2021-09-11: 180 mg via ORAL

## 2021-09-11 MED ORDER — ONDANSETRON HCL 4 MG/2ML IJ SOLN: 4.0000 mg | Freq: Four times a day (QID) | INTRAMUSCULAR | Status: DC | PRN

## 2021-09-11 MED ORDER — TIROFIBAN (AGGRASTAT) BOLUS VIA INFUSION
INTRAVENOUS | Status: DC | PRN
  Administered 2021-09-11: 3287.5 ug via INTRAVENOUS

## 2021-09-11 MED ORDER — ACETAMINOPHEN 325 MG PO TABS: 650.0000 mg | ORAL_TABLET | ORAL | Status: DC | PRN

## 2021-09-11 MED ORDER — TICAGRELOR 90 MG PO TABS
90.0000 mg | ORAL_TABLET | Freq: Two times a day (BID) | ORAL | Status: DC
  Administered 2021-09-11 – 2021-09-13 (×4): 90 mg via ORAL
  Filled 2021-09-11 (×4): qty 1

## 2021-09-11 MED ORDER — TIROFIBAN HCL IN NACL 5-0.9 MG/100ML-% IV SOLN
INTRAVENOUS | Status: AC
  Filled 2021-09-11: qty 100

## 2021-09-11 MED ORDER — HEPARIN SODIUM (PORCINE) 1000 UNIT/ML IJ SOLN
INTRAMUSCULAR | Status: AC
  Filled 2021-09-11: qty 1

## 2021-09-11 MED ORDER — LIDOCAINE HCL (PF) 1 % IJ SOLN
INTRAMUSCULAR | Status: AC
  Filled 2021-09-11: qty 30

## 2021-09-11 MED ORDER — SODIUM CHLORIDE 0.9 % IV SOLN: 250.0000 mL | INTRAVENOUS | Status: DC | PRN

## 2021-09-11 MED ORDER — SODIUM CHLORIDE 0.9 % IV SOLN
4.0000 ug/kg/min | INTRAVENOUS | Status: DC
  Administered 2021-09-11 (×2): 4 ug/kg/min via INTRAVENOUS
  Filled 2021-09-11 (×5): qty 50

## 2021-09-11 MED ORDER — OMEGA-3-ACID ETHYL ESTERS 1 G PO CAPS
2.0000 g | ORAL_CAPSULE | Freq: Every day | ORAL | Status: DC
  Administered 2021-09-11 – 2021-09-13 (×3): 2 g via ORAL
  Filled 2021-09-11 (×3): qty 2

## 2021-09-11 MED ORDER — SODIUM CHLORIDE 0.9 % IV SOLN
INTRAVENOUS | Status: AC | PRN
  Administered 2021-09-11: 4 ug/kg/min via INTRAVENOUS

## 2021-09-11 MED ORDER — IRBESARTAN 150 MG PO TABS
150.0000 mg | ORAL_TABLET | Freq: Every day | ORAL | Status: DC
  Administered 2021-09-12 – 2021-09-13 (×2): 150 mg via ORAL
  Filled 2021-09-11 (×2): qty 1

## 2021-09-11 MED ORDER — VERAPAMIL HCL 2.5 MG/ML IV SOLN
INTRA_ARTERIAL | Status: DC | PRN
  Administered 2021-09-11: 5 mL via INTRA_ARTERIAL

## 2021-09-11 MED ORDER — HEPARIN SODIUM (PORCINE) 1000 UNIT/ML IJ SOLN
INTRAMUSCULAR | Status: DC | PRN
  Administered 2021-09-11: 6000 [IU] via INTRAVENOUS
  Administered 2021-09-11: 3000 [IU] via INTRAVENOUS
  Administered 2021-09-11: 8000 [IU] via INTRAVENOUS
  Administered 2021-09-11: 3000 [IU] via INTRAVENOUS

## 2021-09-11 MED ORDER — ATORVASTATIN CALCIUM 80 MG PO TABS
80.0000 mg | ORAL_TABLET | Freq: Every day | ORAL | Status: DC
  Administered 2021-09-11 – 2021-09-13 (×3): 80 mg via ORAL
  Filled 2021-09-11 (×3): qty 1

## 2021-09-11 MED ORDER — NITROGLYCERIN 1 MG/10 ML FOR IR/CATH LAB
INTRA_ARTERIAL | Status: AC
  Filled 2021-09-11: qty 10

## 2021-09-11 MED ORDER — CANGRELOR BOLUS VIA INFUSION
30.0000 ug/kg | Freq: Once | INTRAVENOUS | Status: DC
  Filled 2021-09-11: qty 3945

## 2021-09-11 MED ORDER — CANGRELOR TETRASODIUM 50 MG IV SOLR
INTRAVENOUS | Status: AC
  Filled 2021-09-11: qty 50

## 2021-09-11 MED ORDER — ATORVASTATIN CALCIUM 80 MG PO TABS: 80.0000 mg | ORAL_TABLET | Freq: Every day | ORAL | Status: DC

## 2021-09-11 MED ORDER — LABETALOL HCL 5 MG/ML IV SOLN
10.0000 mg | INTRAVENOUS | Status: AC | PRN
  Administered 2021-09-11 (×2): 10 mg via INTRAVENOUS
  Filled 2021-09-11: qty 4

## 2021-09-11 MED ORDER — FUROSEMIDE 10 MG/ML IJ SOLN
INTRAMUSCULAR | Status: DC | PRN
  Administered 2021-09-11: 40 mg via INTRAVENOUS

## 2021-09-11 MED ORDER — SODIUM CHLORIDE 0.9 % IV SOLN
INTRAVENOUS | Status: AC | PRN
  Administered 2021-09-11: 50 mL/h via INTRAVENOUS

## 2021-09-11 MED ORDER — TICAGRELOR 90 MG PO TABS
ORAL_TABLET | ORAL | Status: AC
  Filled 2021-09-11: qty 2

## 2021-09-11 MED ORDER — IOHEXOL 350 MG/ML SOLN
INTRAVENOUS | Status: DC | PRN
  Administered 2021-09-11: 140 mL via INTRA_ARTERIAL

## 2021-09-11 MED ORDER — HYDRALAZINE HCL 20 MG/ML IJ SOLN: 10.0000 mg | INTRAMUSCULAR | Status: AC | PRN

## 2021-09-11 MED ORDER — NITROGLYCERIN 0.4 MG SL SUBL
0.4000 mg | SUBLINGUAL_TABLET | SUBLINGUAL | Status: DC | PRN
  Administered 2021-09-11: 0.4 mg via SUBLINGUAL
  Filled 2021-09-11: qty 1

## 2021-09-11 MED ORDER — ALBUTEROL SULFATE (2.5 MG/3ML) 0.083% IN NEBU: 2.5000 mg | INHALATION_SOLUTION | Freq: Four times a day (QID) | RESPIRATORY_TRACT | Status: DC | PRN

## 2021-09-11 MED ORDER — SODIUM CHLORIDE 0.9 % IV SOLN
4.0000 ug/kg/min | INTRAVENOUS | Status: DC
  Administered 2021-09-11: 4 ug/kg/min via INTRAVENOUS
  Filled 2021-09-11 (×2): qty 50

## 2021-09-11 MED ORDER — MORPHINE SULFATE (PF) 2 MG/ML IV SOLN
2.0000 mg | INTRAVENOUS | Status: DC | PRN
  Administered 2021-09-11 – 2021-09-12 (×2): 2 mg via INTRAVENOUS
  Filled 2021-09-11 (×2): qty 1

## 2021-09-11 MED ORDER — FUROSEMIDE 10 MG/ML IJ SOLN
INTRAMUSCULAR | Status: AC
  Filled 2021-09-11: qty 4

## 2021-09-11 MED ORDER — SODIUM CHLORIDE 0.9 % IV SOLN: INTRAVENOUS | Status: AC

## 2021-09-11 MED ORDER — SODIUM CHLORIDE 0.9% FLUSH: 3.0000 mL | INTRAVENOUS | Status: DC | PRN

## 2021-09-11 MED ORDER — VERAPAMIL HCL 2.5 MG/ML IV SOLN
INTRAVENOUS | Status: AC
  Filled 2021-09-11: qty 2

## 2021-09-11 MED ORDER — ASPIRIN 81 MG PO CHEW
81.0000 mg | CHEWABLE_TABLET | Freq: Every day | ORAL | Status: DC
  Administered 2021-09-11 – 2021-09-13 (×3): 81 mg via ORAL
  Filled 2021-09-11 (×3): qty 1

## 2021-09-11 MED ORDER — HEPARIN (PORCINE) IN NACL 1000-0.9 UT/500ML-% IV SOLN
INTRAVENOUS | Status: AC
  Filled 2021-09-11: qty 1000

## 2021-09-11 SURGICAL SUPPLY — 22 items
BALLN SAPPHIRE 2.0X12 (BALLOONS) ×2
BALLN SAPPHIRE 2.5X15 (BALLOONS) ×2
BALLN SAPPHIRE ~~LOC~~ 3.25X12 (BALLOONS) ×2 IMPLANT
BALLOON SAPPHIRE 2.0X12 (BALLOONS) ×1 IMPLANT
BALLOON SAPPHIRE 2.5X15 (BALLOONS) ×1 IMPLANT
CATH INFINITI 5FR ANG PIGTAIL (CATHETERS) ×2 IMPLANT
CATH OPTITORQUE TIG 4.0 5F (CATHETERS) ×2 IMPLANT
CATH PRIORITY ONE AC 6F (CATHETERS) ×2 IMPLANT
CATH VISTA GUIDE 6FR XBLAD3.0 (CATHETERS) ×2 IMPLANT
DEVICE RAD COMP TR BAND LRG (VASCULAR PRODUCTS) ×2 IMPLANT
ELECT DEFIB PAD ADLT CADENCE (PAD) ×2 IMPLANT
GLIDESHEATH SLEND A-KIT 6F 22G (SHEATH) ×2 IMPLANT
GUIDEWIRE INQWIRE 1.5J.035X260 (WIRE) ×1 IMPLANT
INQWIRE 1.5J .035X260CM (WIRE) ×2
KIT ENCORE 26 ADVANTAGE (KITS) ×2 IMPLANT
KIT HEART LEFT (KITS) ×2 IMPLANT
PACK CARDIAC CATHETERIZATION (CUSTOM PROCEDURE TRAY) ×2 IMPLANT
STENT ONYX FRONTIER 3.0X15 (Permanent Stent) ×2 IMPLANT
TRANSDUCER W/STOPCOCK (MISCELLANEOUS) ×2 IMPLANT
TUBING CIL FLEX 10 FLL-RA (TUBING) ×2 IMPLANT
WIRE ASAHI PROWATER 180CM (WIRE) ×2 IMPLANT
WIRE HI TORQ VERSACORE-J 145CM (WIRE) ×2 IMPLANT

## 2021-09-11 NOTE — H&P (Addendum)
Cardiology Admission History and Physical:   Patient ID: Walter Anderson MRN: [id number]; DOB: [birth year]   Admission date: 09/11/2021  PCP:  Lonie Peak, PA-C   [facility] Providers Cardiologist:  New (Dr. Allyson Sabal)  Chief Complaint: chest pain  Patient Profile:   Walter Anderson is a 75 y.o. male with hypertension, asthma, and sleep apnea on CPAP who is being admitted for acute inferolateral STEMI after presenting with chest pain.  History of Present Illness:   Walter Anderson is a 75 year old male with a history of hypertension, asthma, and obstructive sleep apnea on CPAP but no known cardiac history.  He denies any smoking history but does state that he used to chew tobacco.  Quit about 6 to 7 years ago.  No known history of hyperlipidemia or diabetes.  Does have a family history of cardiovascular disease.  He states his father had rheumatic heart disease and his sister had a stroke a couple years ago.  Patient was in his usual state of health until today around 11:30 AM when he developed substernal chest pain that radiated to his shoulders while walking his dog. He described the pain as a pressure/tightness.  No associated shortness of breath, nausea, vomiting with this.  Pain persisted and 911 was ultimately called.  EKG in the field showed ST elevations in inferior lateral leads.  Code STEMI was called.  In route to the ED, he was given 324 mg of aspirin as well of has 3 doses of sublingual Nitro.  He was also given IV fluids.  Upon arrival to the ED, he was still having 7-8/10 chest pain.  He was briefly stopped in the ED for a COVID swab and then taken directly to the Cath Lab for emergent cardiac catheterization.   Past Medical History:  Diagnosis Date   Allergy    Arthritis    Asthma    Hypertension    Sleep apnea    uses CPAP    Past Surgical History:  Procedure Laterality Date   BACK SURGERY  2006   CHOLECYSTECTOMY N/A 11/15/2015   Procedure: LAPAROSCOPIC CHOLECYSTECTOMY  WITH INTRAOPERATIVE CHOLANGIOGRAM;  Surgeon: Kieth Brightly, MD;  Location: [facility] ORS;  Service: General;  Laterality: N/A;   FINGER ARTHROSCOPY  1990   3rd Finger Left Hand   ROTATOR CUFF REPAIR     right shoulder   TONSILLECTOMY  75 years old   TOTAL HIP ARTHROPLASTY Right 04/01/2018   Procedure: RIGHT TOTAL HIP ARTHROPLASTY ANTERIOR APPROACH;  Surgeon: Durene Romans, MD;  Location: [facility];  Service: Orthopedics;  Laterality: Right;  70 mins   UMBILICAL HERNIA REPAIR  11/15/2015   Procedure: HERNIA REPAIR UMBILICAL ADULT;  Surgeon: Kieth Brightly, MD;  Location: [facility] ORS;  Service: General;;     Medications Prior to Admission: Prior to Admission medications   Medication Sig Start Date End Date Taking? Authorizing Provider  albuterol (PROVENTIL HFA;VENTOLIN HFA) 108 (90 Base) MCG/ACT inhaler Inhale 1-2 puffs into the lungs every 6 (six) hours as needed for wheezing or shortness of breath.    [provider]  allopurinol (ZYLOPRIM) 100 MG tablet Take 100 mg by mouth daily.  12/16/19   [provider]  amLODipine (NORVASC) 2.5 MG tablet Take 2.5 mg by mouth daily. 07/28/19   [provider]  BREO ELLIPTA 100-25 MCG/INH AEPB TAKE 1 PUFF BY MOUTH EVERY DAY 11/14/20   Salena Saner, MD  Bromelains (BROMELAIN PO) Take 240 mg by mouth daily.    [provider]  Coenzyme Q10 400 MG CAPS Take 400 mg by mouth daily.     [provider]  colchicine 0.6 MG tablet Take 1.2 mg by mouth daily as needed (gout).     [provider]  docusate sodium (COLACE) 100 MG capsule Take 1 capsule (100 mg total) by mouth 2 (two) times daily. Patient not taking: Reported on 01/14/2020 04/01/18   Lanney Gins, PA-C  doxazosin (CARDURA) 4 MG tablet Take 4 mg by mouth 2 (two) times daily.     [provider]  ferrous sulfate (FERROUSUL) 325 (65 FE) MG tablet Take 1 tablet (325 mg total) by mouth 3 (three) times daily with meals. Patient not  taking: Reported on 01/14/2020 04/01/18   Lanney Gins, PA-C  furosemide (LASIX) 40 MG tablet Take 40 mg by mouth daily as needed.     [provider]  magnesium gluconate (MAGONATE) 500 MG tablet Take 500 mg by mouth 2 (two) times daily.    [provider]  metoprolol succinate (TOPROL-XL) 100 MG 24 hr tablet Take 100 mg by mouth daily.  11/04/15   [provider]  Multiple Vitamin (MULTIVITAMIN WITH MINERALS) TABS tablet Take 1 tablet by mouth daily.    [provider]  niacin 500 MG tablet Take 500 mg by mouth at bedtime.    [provider]  omega-3 acid ethyl esters (LOVAZA) 1 g capsule Take 2 g by mouth daily.    [provider]  potassium chloride SA (K-DUR,KLOR-CON) 20 MEQ tablet Take 1 tablet by mouth daily. 11/10/15   [provider]  psyllium (HYDROCIL/METAMUCIL) 95 % PACK Take 2 packets by mouth daily.    [provider]  testosterone (ANDROGEL) 50 MG/5GM (1%) GEL Place 10 g onto the skin daily.     [provider]  TURMERIC PO Take 750 mg by mouth daily.    [provider]  valsartan (DIOVAN) 320 MG tablet Take 320 mg by mouth daily.     [provider]     Allergies:   No Known Allergies  Social History:   Social History   Socioeconomic History   Marital status: Married    Spouse name: Not on file   Number of children: 2   Years of education: Not on file   Highest education level: 12th grade  Occupational History   Not on file  Tobacco Use   Smoking status: Never   Smokeless tobacco: Former    Types: Chew    Quit date: 11/15/2005  Vaping Use   Vaping Use: Never used  Substance and Sexual Activity   Alcohol use: Yes    Comment: 1-2 Weekly   Drug use: No   Sexual activity: Not Currently  Other Topics Concern   Not on file  Social History Narrative   Not on file   Social Determinants of Health   Financial Resource Strain: Not on file  Food Insecurity: Not on  file  Transportation Needs: Not on file  Physical Activity: Not on file  Stress: Not on file  Social Connections: Not on file  Intimate Partner Violence: Not on file    Family History:   The patient's family history includes COPD in his father; Diabetes in his mother; Heart disease in his father.    ROS:  Please see the history of present illness.  Unable to get full ROS due to need for emergent cardiac catheterization.  Physical Exam/Data:  There were no vitals filed for  this visit. No intake or output data in the 24 hours ending 09/11/21 1433 Last 3 Weights 06/12/2021 01/13/2020 01/13/2020  Weight (lbs) 290 lb 286 lb 13.1 oz 296 lb 8.3 oz  Weight (kg) 131.543 kg 130.1 kg 134.5 kg     There is no height or weight on file to calculate BMI.  General: 75 y.o. Caucasian male resting comfortably in no acute distress. HEENT: Normocephalic and atraumatic. Sclera clear.  Neck: Supple. No carotid bruits. No JVD. Heart: Bradycardic with regular rhythm. Distinct S1 and S2. No murmurs, gallops, or rubs. Radial pulses 2+ and equal bilaterally. Lungs: No increased work of breathing. Clear to ausculation anteriorly. No wheezes, rhonchi, or rales.  Extremities: No lower extremity  edema.    Skin: Warm and dry. Neuro: Alert and oriented x3. No focal deficits. Psych: Normal affect. Responds appropriately.  EKG:  The ECG that was done was personally reviewed and demonstrates ST elevations in inferolateral leads.  Relevant CV Studies: None.  Laboratory Data:  High Sensitivity Troponin:  No results for input(s): TROPONINIHS in the last 720 hours.    ChemistryNo results for input(s): NA, K, CL, CO2, GLUCOSE, BUN, CREATININE, CALCIUM, MG, GFRNONAA, GFRAA, ANIONGAP in the last 168 hours.  No results for input(s): PROT, ALBUMIN, AST, ALT, ALKPHOS, BILITOT in the last 168 hours. Lipids No results for input(s): CHOL, TRIG, HDL, LABVLDL, LDLCALC, CHOLHDL in the last 168 hours. HematologyNo results for  input(s): WBC, RBC, HGB, HCT, MCV, MCH, MCHC, RDW, PLT in the last 168 hours. Thyroid No results for input(s): TSH, FREET4 in the last 168 hours. BNPNo results for input(s): BNP, PROBNP in the last 168 hours.  DDimer No results for input(s): DDIMER in the last 168 hours.   Radiology/Studies:  No results found.   Assessment and Plan:   Acute Inferolateral STEMI - Patient presented with sudden onset of chest pain with radiation to shoulders. Patient still having 7-8/10 pain upon arrival to the ED. - EKG showed ST elevations in inferolateral leads. - Will check high-sensitivity troponin. - Will check Echo. - Will check fasting lipid panel and hemoglobin A1c. - Continue Aspirin 81mg  daily. - Will start high-intensity statin. - No beta-blocker due to bradycardia. - Patient taken directly to cath lab for emergent cardiac catheterization. Further recommendations to follow.  Hypertension - Patient has a history of hypertension.  - On Toprol-XL-50mg  daily and Valsartan 320mg  daily at home. Will hold home beta-blocker due to bradycardia. Will restart Valsartan at reduced dose of 160mg  daily.  Obstructive Sleep Apnea - Per chart review, on CPAP. - Will continue CPAP this admission.   Risk Assessment/Risk Scores:   TIMI Risk Score for ST  Elevation MI:   The patient's TIMI risk score is 3, which indicates a 4.4% risk of all cause mortality at 30 days.   Severity of Illness: The appropriate patient status for this patient is INPATIENT. Inpatient status is judged to be reasonable and necessary in order to provide the required intensity of service to ensure the patient's safety. The patient's presenting symptoms, physical exam findings, and initial radiographic and laboratory data in the context of their chronic comorbidities is felt to place them at high risk for further clinical deterioration. Furthermore, it is not anticipated that the patient will be medically stable for discharge from  the hospital within 2 midnights of admission.   * I certify that at the point of admission it is my clinical judgment that the patient will require inpatient hospital care spanning beyond 2  midnights from the point of admission due to high intensity of service, high risk for further deterioration and high frequency of surveillance required.*   For questions or updates, please contact [facility] Please consult [email or web address] for contact info under     Signed, Corrin Parker, PA-C  09/11/2021 2:33 PM    Agree with note by Marjie Skiff, PA-C  Mr. Kimura is a 75 year old moderately overweight married Caucasian male with history of hypertension who developed chest pain at 1130 this morning.  EMS was called.  His EKG showed inferolateral ST segment elevation.  He was brought urgently to [facility] where he was evaluated and brought to the Cath Lab for angiography potential intervention.  He was hemodynamically stable, somewhat bradycardic with ongoing chest pain.  Other problems include obstructive sleep apnea on CPAP.  His exam is benign.   Runell Gess, M.D., FACP, [facility], [facility] [address] [place], [place]  [address]  [phone] 09/11/2021 3:54 PM

## 2021-09-11 NOTE — TOC Benefit Eligibility Note (Signed)
Test claim submitted for Brilinta #60/30-day $40/copay

## 2021-09-12 ENCOUNTER — Other Ambulatory Visit (HOSPITAL_COMMUNITY): Payer: Self-pay

## 2021-09-12 ENCOUNTER — Inpatient Hospital Stay (HOSPITAL_COMMUNITY): Payer: Medicare PPO

## 2021-09-12 ENCOUNTER — Encounter (HOSPITAL_COMMUNITY): Payer: Self-pay | Admitting: Cardiovascular Disease

## 2021-09-12 DIAGNOSIS — I2102 ST elevation (STEMI) myocardial infarction involving left anterior descending coronary artery: Secondary | ICD-10-CM | POA: Diagnosis not present

## 2021-09-12 LAB — LIPID PANEL
Cholesterol: 156 mg/dL (ref 0–200)
HDL: 29 mg/dL — ABNORMAL LOW (ref >40)
LDL Cholesterol: 86 mg/dL (ref 0–99)
Total CHOL/HDL Ratio: 5.4 RATIO
Triglycerides: 204 mg/dL — ABNORMAL HIGH (ref <150)
VLDL: 41 mg/dL — ABNORMAL HIGH (ref 0–40)

## 2021-09-12 LAB — CBC
HCT: 42.2 % (ref 39.0–52.0)
Hemoglobin: 13.6 g/dL (ref 13.0–17.0)
MCH: 31.2 pg (ref 26.0–34.0)
MCHC: 32.2 g/dL (ref 30.0–36.0)
MCV: 96.8 fL (ref 80.0–100.0)
Platelets: 144 10*3/uL — ABNORMAL LOW (ref 150–400)
RBC: 4.36 MIL/uL (ref 4.22–5.81)
RDW: 14.7 % (ref 11.5–15.5)
WBC: 10.9 10*3/uL — ABNORMAL HIGH (ref 4.0–10.5)
nRBC: 0 % (ref 0.0–0.2)

## 2021-09-12 LAB — BASIC METABOLIC PANEL
Anion gap: 10 (ref 5–15)
BUN: 12 mg/dL (ref 8–23)
CO2: 23 mmol/L (ref 22–32)
Calcium: 8.7 mg/dL — ABNORMAL LOW (ref 8.9–10.3)
Chloride: 104 mmol/L (ref 98–111)
Creatinine, Ser: 0.99 mg/dL (ref 0.61–1.24)
GFR, Estimated: >60 mL/min (ref >60)
Glucose, Bld: 144 mg/dL — ABNORMAL HIGH (ref 70–99)
Potassium: 3.7 mmol/L (ref 3.5–5.1)
Sodium: 137 mmol/L (ref 135–145)

## 2021-09-12 LAB — ECHOCARDIOGRAM COMPLETE
Area-P 1/2: 4.26 cm2
Calc EF: 41 %
Height: 72 in
S' Lateral: 3.35 cm
Single Plane A2C EF: 24.2 %
Single Plane A4C EF: 50.9 %
Weight: 4571.46 oz

## 2021-09-12 MED ORDER — SPIRONOLACTONE 12.5 MG HALF TABLET
12.5000 mg | ORAL_TABLET | Freq: Every day | ORAL | Status: DC
  Administered 2021-09-12 – 2021-09-13 (×2): 12.5 mg via ORAL
  Filled 2021-09-12 (×2): qty 1

## 2021-09-12 MED ORDER — PERFLUTREN LIPID MICROSPHERE
1.0000 mL | INTRAVENOUS | Status: AC | PRN
  Administered 2021-09-12: 4 mL via INTRAVENOUS
  Filled 2021-09-12: qty 10

## 2021-09-12 MED ORDER — CARVEDILOL 3.125 MG PO TABS
3.1250 mg | ORAL_TABLET | Freq: Two times a day (BID) | ORAL | Status: DC
  Administered 2021-09-12 – 2021-09-13 (×3): 3.125 mg via ORAL
  Filled 2021-09-12 (×3): qty 1

## 2021-09-12 MED ORDER — POTASSIUM CHLORIDE CRYS ER 20 MEQ PO TBCR
40.0000 meq | EXTENDED_RELEASE_TABLET | Freq: Once | ORAL | Status: AC
  Administered 2021-09-12: 40 meq via ORAL
  Filled 2021-09-12: qty 2

## 2021-09-12 MED ORDER — CHLORHEXIDINE GLUCONATE CLOTH 2 % EX PADS
6.0000 | MEDICATED_PAD | Freq: Every day | CUTANEOUS | Status: DC
  Administered 2021-09-12: 6 via TOPICAL

## 2021-09-12 MED FILL — Tirofiban HCl in NaCl 0.9% IV Soln 5 MG/100ML (Base Equiv): INTRAVENOUS | Qty: 100 | Status: AC

## 2021-09-12 MED FILL — Heparin Sod (Porcine)-NaCl IV Soln 1000 Unit/500ML-0.9%: INTRAVENOUS | Qty: 1000 | Status: AC

## 2021-09-12 NOTE — Progress Notes (Signed)
CARDIAC REHAB PHASE I   PRE:  Rate/Rhythm: 68 SR  BP:  Sitting: 152/84      SaO2: 97 RA  MODE:  Ambulation: 740 ft   POST:  Rate/Rhythm: 120 ST  BP:  Sitting: 171/82    SaO2: 99 RA   Pt ambulated 796ft in hallway independently with steady gait. Pt denies CP or dizziness, but does endorse some SOB. Pt educated on importance of ASA, Brilinta, statin, and NTG. Pt given MI book, stent card, and heart healthy diet. Reviewed site care, restrictions, and exercise guidelines. Echo at bedside. Will continue to follow.  [id number] Reynold Bowen, RN BSN 09/12/2021 1:48 PM

## 2021-09-12 NOTE — Progress Notes (Signed)
Progress Note  Patient Name: Walter Anderson Date of Encounter: 09/12/2021  [facility] Cardiologist: None   Subjective   No chest pain or shortness of breath this morning.  He did have some discomfort overnight.  He is feeling better now.  No other complaints.  Inpatient Medications    Scheduled Meds:  aspirin  81 mg Oral Daily   atorvastatin  80 mg Oral Daily   cangrelor  30 mcg/kg Intravenous Once   Chlorhexidine Gluconate Cloth  6 each Topical Daily   irbesartan  150 mg Oral Daily   omega-3 acid ethyl esters  2 g Oral Daily   potassium chloride  40 mEq Oral Once   sodium chloride flush  3 mL Intravenous Q12H   ticagrelor  90 mg Oral BID   Continuous Infusions:  sodium chloride     cangrelor 50 mg in NS 250 mL Stopped (09/11/21 1920)   PRN Meds: sodium chloride, acetaminophen, albuterol, morphine injection, nitroGLYCERIN, ondansetron (ZOFRAN) IV, sodium chloride flush   Vital Signs    Vitals:   09/12/21 0330 09/12/21 0400 09/12/21 0500 09/12/21 0727  BP: 134/74 138/72 125/69   Pulse: 63 (!) 58 (!) 57   Resp: $Remo'14 16 16   'YZaeB$ Temp:    98.3 F (36.8 C)  TempSrc:    Oral  SpO2: 99% 96% 98%   Weight:      Height:        Intake/Output Summary (Last 24 hours) at 09/12/2021 0814 Last data filed at 09/12/2021 0400 Gross per 24 hour  Intake 1174.87 ml  Output 3400 ml  Net -2225.13 ml   Last 3 Weights 09/11/2021 09/11/2021 06/12/2021  Weight (lbs) 285 lb 11.5 oz 290 lb 290 lb  Weight (kg) 129.6 kg 131.543 kg 131.543 kg      Telemetry    Sinus rhythm with occasional PVCs.  There is a lot of artifact present at times.- Personally Reviewed  ECG    Normal sinus rhythm with age-indeterminate anterolateral infarct- Personally Reviewed  Physical Exam  Alert, oriented, obese male in no distress GEN: No acute distress.   Neck: No JVD Cardiac: RRR, no murmurs, rubs, or gallops.  Respiratory: Clear to auscultation bilaterally. GI: Soft, nontender, non-distended   MS: No edema; No deformity.  Right radial pulses 2+ without hematoma or ecchymosis at the cardiac catheterization site. Neuro:  Nonfocal  Psych: Normal affect   Labs    High Sensitivity Troponin:   Recent Labs  Lab 09/11/21 1436 09/11/21 1645  TROPONINIHS 66* >24,000*     Chemistry Recent Labs  Lab 09/11/21 1436 09/11/21 1444 09/12/21 0139  NA 137 142 137  K 4.1 4.2 3.7  CL 108 110 104  CO2 19*  --  23  GLUCOSE 164* 163* 144*  BUN $Re'13 13 12  'CJp$ CREATININE 0.89 0.80 0.99  CALCIUM 8.7*  --  8.7*  PROT 6.2*  --   --   ALBUMIN 3.6  --   --   AST 18  --   --   ALT 20  --   --   ALKPHOS 71  --   --   BILITOT 0.7  --   --   GFRNONAA >60  --  >60  ANIONGAP 10  --  10    Lipids  Recent Labs  Lab 09/12/21 0139  CHOL 156  TRIG 204*  HDL 29*  LDLCALC 86  CHOLHDL 5.4    Hematology Recent Labs  Lab 09/11/21 1436 09/11/21 1444 09/12/21  0139  WBC 8.5  --  10.9*  RBC 4.26  --  4.36  HGB 13.5 13.3 13.6  HCT 40.2 39.0 42.2  MCV 94.4  --  96.8  MCH 31.7  --  31.2  MCHC 33.6  --  32.2  RDW 14.6  --  14.7  PLT 133*  --  144*   Thyroid No results for input(s): TSH, FREET4 in the last 168 hours.  BNPNo results for input(s): BNP, PROBNP in the last 168 hours.  DDimer No results for input(s): DDIMER in the last 168 hours.   Radiology    CARDIAC CATHETERIZATION  Result Date: 09/11/2021 Images from the original result were not included.   Mid LAD lesion is 100% stenosed.   A drug-eluting stent was successfully placed using a STENT ONYX FRONTIER 3.0X15.   Post intervention, there is a 0% residual stenosis. Walter Anderson is a 75 y.o. male  [id number] LOCATION:  FACILITY: [facility] PHYSICIAN: Quay Burow, M.D. Jan 10, 1946 DATE OF PROCEDURE:  09/11/2021 DATE OF DISCHARGE: CARDIAC CATHETERIZATION [place] stent mid LAD History obtained from chart review.SOU [name] is a 75 y.o. male with hypertension, asthma, and sleep apnea on CPAP who is being admitted for acute  inferolateral STEMI after presenting with chest pain. PROCEDURE DESCRIPTION: The patient was brought to the second floor [facility] cath lab in the postabsorptive state. He was not premedicated . His right wrist was prepped and shaved in usual sterile fashion. Xylocaine 1% was used for local anesthesia. A 6 French sheath was inserted into the right radial artery using standard Seldinger technique. The patient received 6000 units  of heparin intravenously.  A 5 [place] TIG catheter and pigtail catheters were used for selective coronary angiography, left ventriculography, PCI drug-eluting stent of mid LAD.  Isovue dye was used for the entirety of the case (140 cc total to patient).  Retrograde aortic, ventricular and pullback pressures were recorded.  Radial cocktail was administered via the SideArm sheath. The culprit vessel was the mid LAD.  The patient received a total of 20 thousandths of heparin with an ending ACT of 289.  In addition, he received Aggrastat bolus without infusion, cangrelor bolus with a 4-hour infusion.  Isovue dye was used for the entirety of the intervention.  Retroaortic pressures was monitored during the case.  The patient received Brilinta 180 mg p.o. prior to intervention.   Mr. Schliep presented with inferior lateral and anterolateral ST segment elevation with bradycardia and chest pain.  His pain began at 1130 this morning.  Cardiac cath revealed an occluded mid LAD.  I performed aspiration thrombectomy with little result.  I then performed PCI drug-eluting stenting with a 3 mm x 15 mm long Medtronic frontier drug-eluting stent postdilated with a 3.25 balloon at 16 atm (3.3 mm) resulting reduction with total occlusion was TIMI 0 flow to 0% residual TIMI-3 flow.  The patient's symptoms resolved.  He I am going to treat him with cangrelor bolus 4 hours of infusion.  2D echo is ordered.  He received 40 mg of IV Lasix because of an LVEDP of 30.  He will need dual antiplatelet therapy  uninterrupted for 12 months.  The sheath was removed and a TR band was placed on the right wrist to achieve patent hemostasis.  The patient left lab in stable condition.  The door to balloon time was 37 minutes. Quay Burow. MD, [facility] 09/11/2021 3:41 PM     Cardiac Studies   Cardiac Cath study reviewed  from yesterday  Patient Profile     75 y.o. male with acute STEMI involving the LAD, undergoes primary PCI 09-11-2021  Assessment & Plan    STEMI involving the LAD: Continue aspirin and ticagrelor uninterrupted x12 months.  Ticagrelor cost may be an issue for the patient and we discussed this this morning.  We will consider switching him after his 30-day card or potentially switching him after 3 months.  His out-of-pocket cost will be $40 per month.  2D echo to assess LV function today. HTN: Continue ARB which she was on at home.  Hold amlodipine (home medication).  Add carvedilol 3.125 mg twice daily and spironolactone 12.5 mg daily. Sleep apnea: Chronic issue, on CPAP Mixed hyperlipidemia: Atorvastatin 80 mg started.  Lipids reviewed.  Disposition: Check 2D echocardiogram, transfer to telemetry bed, possible hospital discharge tomorrow.  Phase 1 cardiac rehab today.  For questions or updates, please contact [place] Please consult [email or web address] for contact info under        Signed, Sherren Mocha, MD  09/12/2021, 8:14 AM

## 2021-09-12 NOTE — TOC Benefit Eligibility Note (Addendum)
Patient Product/process development scientist completed.    The patient is currently admitted and upon discharge could be taking Jardiance 10 mg.  The current 30 day co-pay is, $40.00.   The patient is currently admitted and upon discharge could be taking Farxiga 10 mg.  The current 30 day co-pay is, $64.00.  The patient is currently admitted and upon discharge could be taking Lovaza 1 g.  The current 30 day co-pay is, $9.98.  The patient is currently admitted and upon discharge could be taking Entresto 24-26 mg.  The current 30 day co-pay is, $40.00.  The patient is currently admitted and upon discharge could be taking Brilinta 90 mg.  The current 30 day co-pay is, $40.00.  The patient is insured through [employer] Medicare Part D     Roland Earl, CPhT Pharmacy Patient Advocate Specialist [facility] Antimicrobial Stewardship Team Direct Number: [phone]  Fax: [phone]

## 2021-09-12 NOTE — Plan of Care (Signed)
  Problem: Education: Goal: Understanding of CV disease, CV risk reduction, and recovery process will improve Outcome: Progressing Goal: Individualized Educational Video(s) Outcome: Progressing   Problem: Activity: Goal: Ability to return to baseline activity level will improve Outcome: Progressing   Problem: Cardiovascular: Goal: Ability to achieve and maintain adequate cardiovascular perfusion will improve Outcome: Progressing Goal: Vascular access site(s) Level 0-1 will be maintained Outcome: Progressing   Problem: Health Behavior/Discharge Planning: Goal: Ability to safely manage health-related needs after discharge will improve Outcome: Progressing   Problem: Clinical Measurements: Goal: Will remain free from infection Outcome: Progressing   Problem: Elimination: Goal: Will not experience complications related to bowel motility Outcome: Progressing Goal: Will not experience complications related to urinary retention Outcome: Progressing   Problem: Pain Managment: Goal: General experience of comfort will improve Outcome: Progressing   Problem: Safety: Goal: Ability to remain free from injury will improve Outcome: Progressing   Problem: Skin Integrity: Goal: Risk for impaired skin integrity will decrease Outcome: Progressing

## 2021-09-12 NOTE — Discharge Instructions (Signed)

## 2021-09-13 ENCOUNTER — Other Ambulatory Visit (HOSPITAL_COMMUNITY): Payer: Self-pay

## 2021-09-13 ENCOUNTER — Telehealth (HOSPITAL_COMMUNITY): Payer: Self-pay | Admitting: Pharmacist

## 2021-09-13 ENCOUNTER — Encounter (HOSPITAL_COMMUNITY): Payer: Self-pay | Admitting: Cardiovascular Disease

## 2021-09-13 DIAGNOSIS — I10 Essential (primary) hypertension: Secondary | ICD-10-CM

## 2021-09-13 DIAGNOSIS — I255 Ischemic cardiomyopathy: Secondary | ICD-10-CM

## 2021-09-13 DIAGNOSIS — E785 Hyperlipidemia, unspecified: Secondary | ICD-10-CM

## 2021-09-13 DIAGNOSIS — I2102 ST elevation (STEMI) myocardial infarction involving left anterior descending coronary artery: Secondary | ICD-10-CM | POA: Diagnosis not present

## 2021-09-13 MED ORDER — TICAGRELOR 90 MG PO TABS
90.0000 mg | ORAL_TABLET | Freq: Two times a day (BID) | ORAL | 2 refills | Status: DC
  Filled 2021-09-13: qty 60, 30d supply, fill #0

## 2021-09-13 MED ORDER — NITROGLYCERIN 0.4 MG SL SUBL
0.4000 mg | SUBLINGUAL_TABLET | SUBLINGUAL | 2 refills | Status: DC | PRN
  Filled 2021-09-13: qty 25, 7d supply, fill #0

## 2021-09-13 MED ORDER — ENTRESTO 24-26 MG PO TABS
1.0000 | ORAL_TABLET | Freq: Two times a day (BID) | ORAL | 1 refills | Status: DC
  Filled 2021-09-13: qty 60, 30d supply, fill #0

## 2021-09-13 MED ORDER — ATORVASTATIN CALCIUM 80 MG PO TABS
80.0000 mg | ORAL_TABLET | Freq: Every day | ORAL | 1 refills | Status: DC
  Filled 2021-09-13: qty 90, 90d supply, fill #0

## 2021-09-13 MED ORDER — CARVEDILOL 3.125 MG PO TABS
3.1250 mg | ORAL_TABLET | Freq: Two times a day (BID) | ORAL | 0 refills | Status: DC
  Filled 2021-09-13: qty 180, 90d supply, fill #0

## 2021-09-13 MED ORDER — SPIRONOLACTONE 25 MG PO TABS
12.5000 mg | ORAL_TABLET | Freq: Every day | ORAL | 1 refills | Status: DC
  Filled 2021-09-13: qty 45, 90d supply, fill #0

## 2021-09-13 NOTE — Discharge Summary (Addendum)
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   A drug-eluting stent was successfully placed using a STENT ONYX FRONTIER 3.0X15.   Post intervention, there is a 0% residual stenosis.   IMPRESSION: Walter Anderson with inferior lateral and anterolateral ST segment elevation with bradycardia and chest pain.  His pain began at 1130 this morning.  Cardiac cath revealed an occluded mid LAD.  I performed aspiration thrombectomy with little result.  I then performed PCI drug-eluting stenting with a 3 mm x 15 mm long Medtronic frontier drug-eluting stent postdilated with a 3.25 balloon at 16 atm (3.3 mm) resulting reduction with total occlusion was TIMI 0 flow to 0% residual TIMI-3 flow.  The patient's symptoms resolved.  He I am going to treat him with cangrelor bolus 4 hours of infusion.  2D echo is ordered.  He received 40 mg of IV Lasix because of an LVEDP of 30.  He will need dual antiplatelet therapy uninterrupted for 12 months.  The sheath was removed and a TR band was placed on the right wrist to achieve patent hemostasis.  The patient left lab in stable condition.  The door to balloon time was 37 minutes.   Quay Burow. MD, [facility] 09/11/2021 3:41 PM  Echo: 09/12/21  IMPRESSIONS     1. Mid septum into apex is akinetic consistent with large LAD infarction.  No LV thrombus on contrast imaging. Left ventricular  ejection fraction, by  estimation, is 30 to 35%. The left ventricle has moderately decreased  function. The left ventricle  demonstrates regional wall motion abnormalities (see scoring  diagram/findings for description). There is mild left ventricular  hypertrophy. Left ventricular diastolic parameters are consistent with  Grade II diastolic dysfunction (pseudonormalization).   2. Right ventricular systolic function is normal. The right ventricular  size is normal. Tricuspid regurgitation signal is inadequate for assessing  PA pressure.   3. The mitral valve is grossly normal. Trivial mitral valve  regurgitation. No evidence of mitral stenosis.   4. The aortic valve is tricuspid. There is moderate calcification of the  aortic valve. There is mild thickening of the aortic valve. Aortic valve  regurgitation is not visualized. Mild to moderate aortic valve  sclerosis/calcification is present, without  any evidence of aortic stenosis.   5. The inferior vena cava is dilated in size with >50% respiratory  variability, suggesting right atrial pressure of 8 mmHg.   Comparison(s): Changes from prior study are noted. The left ventricular  function is significantly worse. New WMAs consistent with LAD infarction.   Conclusion(s)/Recommendation(s): Findings consistent with ischemic  cardiomyopathy. No left ventricular mural or apical thrombus/thrombi.   FINDINGS   Left Ventricle: Mid septum into apex is akinetic consistent with large  LAD infarction. No LV thrombus on contrast imaging. Left ventricular  ejection fraction, by estimation, is 30 to 35%. The left ventricle has  moderately decreased function. The left  ventricle demonstrates regional wall motion  abnormalities. Definity  contrast agent was given IV to delineate the left ventricular endocardial  borders. The left ventricular internal cavity size was normal in size.  There is mild left ventricular  hypertrophy. Left ventricular  diastolic parameters are consistent with  Grade II diastolic dysfunction (pseudonormalization).      LV Wall Scoring:  The mid and distal anterior septum, entire apex, and mid inferoseptal  segment  are akinetic.   Right Ventricle: The right ventricular size is normal. No increase in  right ventricular wall thickness. Right ventricular systolic function is  normal. Tricuspid regurgitation signal is inadequate for assessing PA  pressure.   Left Atrium: Left atrial size was normal in size.   Right Atrium: Right atrial size was normal in size.   Pericardium: Trivial pericardial effusion is present. Presence of  pericardial fat pad.   Mitral Valve: The mitral valve is grossly normal. Trivial mitral valve  regurgitation. No evidence of mitral valve stenosis.   Tricuspid Valve: The tricuspid valve is grossly normal. Tricuspid valve  regurgitation is not demonstrated. No evidence of tricuspid stenosis.   Aortic Valve: The aortic valve is tricuspid. There is moderate  calcification of the aortic valve. There is mild thickening of the aortic  valve. Aortic valve regurgitation is not visualized. Mild to moderate  aortic valve sclerosis/calcification is  present, without any evidence of aortic stenosis.   Pulmonic Valve: The pulmonic valve was grossly normal. Pulmonic valve  regurgitation is trivial. No evidence of pulmonic stenosis.   Aorta: The aortic root and ascending aorta are structurally normal, with  no evidence of dilitation.   Venous: The inferior vena cava is dilated in size with greater than 50%  respiratory variability, suggesting right atrial pressure of 8 mmHg.   IAS/Shunts: The atrial septum is grossly normal.  _____________   History of Present Illness     Walter Anderson is a 75 y.o. male with a history of hypertension, asthma, and obstructive sleep apnea on CPAP but no known cardiac history.  He denied any smoking history but does state that he used to chew  tobacco.  Quit about 6 to 7 years ago.  No known history of hyperlipidemia or diabetes.  Does have a family history of cardiovascular disease.  He states his father had rheumatic heart disease and his sister had a stroke a couple years ago.   Patient was in his usual state of health until the day of admission around 11:30 AM when he developed substernal chest pain that radiated to his shoulders while walking his dog. He described the pain as a pressure/tightness.  No associated shortness of breath, nausea, vomiting with this.  Pain persisted and 911 was ultimately called.  EKG in the field showed ST elevations in inferior lateral leads.  Code STEMI was called.  In route to the ED, he was given 324 mg of aspirin as well of has 3 doses of sublingual Nitro.  He was also given IV fluids.  Upon arrival to the ED, he was still having 7-8/10 chest pain.  He was briefly stopped in the ED for a COVID swab and then taken directly to the Cath Lab for emergent cardiac catheterization.  Hospital Course     STEMI: Underwent cardiac catheterization noted above with occluded mid LAD treated with aspiration thrombectomy along with PCI/DES x1.  He was treated with cangrelor for 4 additional hours.  Placed on DAPT with aspirin/Brilinta for at least 1 year. No recurrent chest pain. Seen by CR.  --  continue ASA/Brilinta, statin, coreg 3.125mg  BID, Entresto 24/26mg  BID, spiro 12.5mg  daily   HFrEF: Echocardiogram showed an EF of 30 to 35% with akinesis in the mid septum and apex consistent with large LAD infarction.  No LV thrombus noted.  Normal RV.  LVEDP was elevated during cath and he was treated with IV Lasix with good response. --GDMT: Tolerated addition of Coreg 3.125 mg twice daily, ARB was transitioned to Entresto 24/26 mg twice daily, spironolactone 12.5 mg daily.  Can consider Jardiance as an outpatient, though patient's co-pays are around $40 a piece for 30-day supply per meds.  May not be feasible.  --Lifevest  placed prior to discharge  Hypertension: Well-controlled on Coreg 3.125 mg twice daily, transition to Entresto 24/26 mg twice daily at discharge, spironolactone 12.5 mg daily.   Hyperlipidemia: Started on atorvastatin 80 mg daily --Will need FLP/LFTs in 8 weeks  OSA: Compliant with CPAP  General: Well developed, well nourished, male appearing in no acute distress. Head: Normocephalic, atraumatic.  Neck: Supple without bruits, JVD. Lungs:  Resp regular and unlabored, CTA. Heart: RRR, S1, S2, no S3, S4, or murmur; no rub. Abdomen: Soft, non-tender, non-distended with normoactive bowel sounds. No hepatomegaly. No rebound/guarding. No obvious abdominal masses. Extremities: No clubbing, cyanosis, edema. Distal pedal pulses are 2+ bilaterally. Right radial cath site stable without bruising or hematoma Neuro: Alert and oriented X 3. Moves all extremities spontaneously. Psych: Normal affect.  Patient was seen by Dr. Burt Knack and deemed stable for discharge home.  Follow-up in the office has been arranged.  Medications sent to [facility].  Educated by [employer].D. prior to discharge.  Did the patient have an acute coronary syndrome (MI, NSTEMI, STEMI, etc) this admission?:  Yes                               AHA/ACC Clinical Performance & Quality Measures: Aspirin prescribed? - Yes ADP Receptor Inhibitor (Plavix/Clopidogrel, Brilinta/Ticagrelor or Effient/Prasugrel) prescribed (includes medically managed patients)? - Yes Beta Blocker prescribed? - Yes High Intensity Statin (Lipitor 40-80mg  or Crestor 20-40mg ) prescribed? - Yes EF assessed during THIS hospitalization? - Yes For EF <40%, was ACEI/ARB prescribed? - Yes For EF <40%, Aldosterone Antagonist (Spironolactone or Eplerenone) prescribed? - Yes Cardiac Rehab Phase II ordered (including medically managed patients)? - Yes       The patient will be scheduled for a TOC follow up appointment in 10-14 days.  A message has been sent to the [facility] and Scheduling Pool at the office where the patient should be seen for follow up.  _____________  Discharge Vitals Blood pressure 139/75, pulse 91, temperature 97.9 F (36.6 C), temperature source Oral, resp. rate 20, height 6' (1.829 m), weight 132.3 kg, SpO2 95 %.  Filed Weights   09/11/21 1607 09/12/21 2100  Weight: 129.6 kg 132.3 kg    Labs & Radiologic Studies    CBC Recent Labs    09/11/21 1436 09/11/21 1444 09/12/21 0139  WBC 8.5  --  10.9*  NEUTROABS 7.2  --   --   HGB 13.5 13.3 13.6  HCT 40.2 39.0 42.2  MCV 94.4  --  96.8  PLT 133*  --  161*   Basic Metabolic Panel Recent Labs    09/11/21 1436 09/11/21 1444 09/12/21 0139  NA 137 142 137  K 4.1 4.2 3.7  CL 108 110 104  CO2 19*  --  23  GLUCOSE 164* 163* 144*  BUN '13 13 12  '$ CREATININE 0.89 0.80 0.99  CALCIUM 8.7*  --  8.7*   Liver Function Tests Recent Labs    09/11/21 1436  AST 18  ALT 20  ALKPHOS 71  BILITOT 0.7  PROT 6.2*  ALBUMIN 3.6   No results for input(s): LIPASE, AMYLASE in the last 72 hours. High Sensitivity Troponin:   Recent Labs  Lab 09/11/21 1436 09/11/21 1645  TROPONINIHS 66* >24,000*    BNP Invalid input(s): POCBNP D-Dimer No results for input(s): DDIMER in the last 72 hours. Hemoglobin A1C Recent Labs    09/11/21 1436  HGBA1C 5.5   Fasting Lipid Panel Recent Labs    09/12/21 0139  CHOL 156  HDL 29*  LDLCALC 86  TRIG 204*  CHOLHDL 5.4   Thyroid Function Tests No results for input(s): TSH, T4TOTAL, T3FREE, THYROIDAB in the last 72 hours.  Invalid input(s): FREET3 _____________  CARDIAC CATHETERIZATION  Result Date: 09/11/2021 Images from the original result were not included.   Mid LAD lesion is 100% stenosed.   A drug-eluting stent was successfully placed using a STENT ONYX FRONTIER 3.0X15.   Post intervention, there is a 0% residual stenosis. Walter Anderson is a 75 y.o. male  [id number] LOCATION:  FACILITY: [facility] PHYSICIAN: Quay Burow, M.D. 1945-12-02  DATE OF PROCEDURE:  09/11/2021 DATE OF DISCHARGE: CARDIAC CATHETERIZATION [place] stent mid LAD History obtained from chart review.Walter Anderson is a 75 y.o. male with hypertension, asthma, and sleep apnea on CPAP who is being admitted for acute inferolateral STEMI after presenting with chest pain. PROCEDURE DESCRIPTION: The patient was brought to the second floor [facility] cath lab in the postabsorptive state. He was not premedicated . His right wrist was prepped and shaved in usual sterile fashion. Xylocaine 1% was used for local anesthesia. A 6 French sheath was inserted into the right radial artery using standard Seldinger technique. The patient received 6000 units  of heparin intravenously.  A 5 [place] TIG catheter and pigtail catheters were used for selective coronary angiography, left ventriculography, PCI drug-eluting stent of mid LAD.  Isovue dye was used for the entirety of the case (140 cc total to patient).  Retrograde aortic, ventricular and pullback pressures were recorded.  Radial cocktail was administered via the SideArm sheath. The culprit vessel was the mid LAD.  The patient received a total of 20 thousandths of heparin with an ending ACT of 289.  In addition, he received Aggrastat bolus without infusion, cangrelor bolus with a 4-hour infusion.  Isovue dye was used for the entirety of the intervention.  Retroaortic pressures was monitored during the case.  The patient received Brilinta 180 mg p.o. prior to intervention.   Mr. Holsinger Anderson with inferior lateral and anterolateral ST segment elevation with bradycardia and chest pain.  His pain began at 1130 this morning.  Cardiac cath revealed an occluded mid LAD.  I performed aspiration thrombectomy with little result.  I then performed PCI drug-eluting stenting with a 3 mm x 15 mm long Medtronic frontier drug-eluting stent postdilated with a 3.25 balloon at 16 atm (3.3 mm) resulting reduction with total occlusion was TIMI 0  flow to 0% residual TIMI-3 flow.  The patient's symptoms resolved.  He I am going to treat him with cangrelor bolus 4 hours of infusion.  2D echo is ordered.  He received 40 mg of IV Lasix because of an LVEDP of 30.  He will need dual antiplatelet therapy uninterrupted for 12 months.  The  sheath was removed and a TR band was placed on the right wrist to achieve patent hemostasis.  The patient left lab in stable condition.  The door to balloon time was 37 minutes. Quay Burow. MD, [facility] 09/11/2021 3:41 PM    ECHOCARDIOGRAM COMPLETE  Result Date: 09/12/2021    ECHOCARDIOGRAM REPORT   Patient Name:   Walter Anderson Date of Exam: 09/12/2021 Medical Rec #:  [id number]     Height:       72.0 in Accession #:    [id number]    Weight:       285.7 lb Date of Birth:  Jul 13, 1946      BSA:          2.479 m Patient Age:    45 years      BP:           152/125 mmHg Patient Gender: M             HR:           74 bpm. Exam Location:  Inpatient Procedure: 2D Echo, Cardiac Doppler, Color Doppler and Intracardiac            Opacification Agent Indications:    STEMI (ST elevation myocardial infarction) [facility]) [220254]  History:        Patient has prior history of Echocardiogram examinations, most                 recent 08/24/2019. Risk Factors:Hypertension and Sleep Apnea.  Sonographer:    Darlina Sicilian RDCS Referring Phys: [id number] [place]  1. Mid septum into apex is akinetic consistent with large LAD infarction. No LV thrombus on contrast imaging. Left ventricular ejection fraction, by estimation, is 30 to 35%. The left ventricle has moderately decreased function. The left ventricle demonstrates regional wall motion abnormalities (see scoring diagram/findings for description). There is mild left ventricular hypertrophy. Left ventricular diastolic parameters are consistent with Grade II diastolic dysfunction (pseudonormalization).  2. Right ventricular systolic function is normal. The right ventricular size is  normal. Tricuspid regurgitation signal is inadequate for assessing PA pressure.  3. The mitral valve is grossly normal. Trivial mitral valve regurgitation. No evidence of mitral stenosis.  4. The aortic valve is tricuspid. There is moderate calcification of the aortic valve. There is mild thickening of the aortic valve. Aortic valve regurgitation is not visualized. Mild to moderate aortic valve sclerosis/calcification is present, without any evidence of aortic stenosis.  5. The inferior vena cava is dilated in size with >50% respiratory variability, suggesting right atrial pressure of 8 mmHg. Comparison(s): Changes from prior study are noted. The left ventricular function is significantly worse. New WMAs consistent with LAD infarction. Conclusion(s)/Recommendation(s): Findings consistent with ischemic cardiomyopathy. No left ventricular mural or apical thrombus/thrombi. FINDINGS  Left Ventricle: Mid septum into apex is akinetic consistent with large LAD infarction. No LV thrombus on contrast imaging. Left ventricular ejection fraction, by estimation, is 30 to 35%. The left ventricle has moderately decreased function. The left ventricle demonstrates regional wall motion abnormalities. Definity contrast agent was given IV to delineate the left ventricular endocardial borders. The left ventricular internal cavity size was normal in size. There is mild left ventricular hypertrophy. Left ventricular diastolic parameters are consistent with Grade II diastolic dysfunction (pseudonormalization).  LV Wall Scoring: The mid and distal anterior septum, entire apex, and mid inferoseptal segment are akinetic. Right Ventricle: The right ventricular size is normal. No increase in right ventricular wall thickness. Right ventricular systolic function  is normal. Tricuspid regurgitation signal is inadequate for assessing PA pressure. Left Atrium: Left atrial size was normal in size. Right Atrium: Right atrial size was normal in size.  Pericardium: Trivial pericardial effusion is present. Presence of pericardial fat pad. Mitral Valve: The mitral valve is grossly normal. Trivial mitral valve regurgitation. No evidence of mitral valve stenosis. Tricuspid Valve: The tricuspid valve is grossly normal. Tricuspid valve regurgitation is not demonstrated. No evidence of tricuspid stenosis. Aortic Valve: The aortic valve is tricuspid. There is moderate calcification of the aortic valve. There is mild thickening of the aortic valve. Aortic valve regurgitation is not visualized. Mild to moderate aortic valve sclerosis/calcification is present, without any evidence of aortic stenosis. Pulmonic Valve: The pulmonic valve was grossly normal. Pulmonic valve regurgitation is trivial. No evidence of pulmonic stenosis. Aorta: The aortic root and ascending aorta are structurally normal, with no evidence of dilitation. Venous: The inferior vena cava is dilated in size with greater than 50% respiratory variability, suggesting right atrial pressure of 8 mmHg. IAS/Shunts: The atrial septum is grossly normal.  LEFT VENTRICLE PLAX 2D LVIDd:         4.50 cm      Diastology LVIDs:         3.35 cm      LV e' medial:    5.55 cm/s LV PW:         1.22 cm      LV E/e' medial:  14.7 LV IVS:        1.31 cm      LV e' lateral:   6.38 cm/s LVOT diam:     1.90 cm      LV E/e' lateral: 12.8 LV SV:         37 LV SV Index:   15 LVOT Area:     2.84 cm  LV Volumes (MOD) LV vol d, MOD A2C: 128.0 ml LV vol d, MOD A4C: 165.0 ml LV vol s, MOD A2C: 97.0 ml LV vol s, MOD A4C: 81.0 ml LV SV MOD A2C:     31.0 ml LV SV MOD A4C:     165.0 ml LV SV MOD BP:      63.3 ml RIGHT VENTRICLE RV S prime:     13.30 cm/s TAPSE (M-mode): 2.2 cm LEFT ATRIUM             Index        RIGHT ATRIUM           Index LA diam:        4.00 cm 1.61 cm/m   RA Area:     12.30 cm LA Vol (A2C):   41.7 ml 16.82 ml/m  RA Volume:   27.70 ml  11.17 ml/m LA Vol (A4C):   59.8 ml 24.12 ml/m LA Biplane Vol: 54.8 ml 22.10 ml/m   AORTIC VALVE LVOT Vmax:   77.90 cm/s LVOT Vmean:  45.700 cm/s LVOT VTI:    0.132 m  AORTA Ao Root diam: 3.50 cm Ao Asc diam:  3.90 cm MITRAL VALVE MV Area (PHT): 4.26 cm    SHUNTS MV Decel Time: 178 msec    Systemic VTI:  0.13 m MV E velocity: 81.60 cm/s  Systemic Diam: 1.90 cm MV A velocity: 59.05 cm/s MV E/A ratio:  1.38 Eleonore Chiquito MD Electronically signed by Eleonore Chiquito MD Signature Date/Time: 09/12/2021/2:49:30 PM    Final    Disposition   Pt is being discharged home today in good condition.  Follow-up  Plans & Appointments     Follow-up Information     Deberah Pelton, NP Follow up on 09/22/2021.   Specialty: Cardiology Why: at 2:45pm for your follow up appt with Dr. Kennon Holter NP Contact information: [address] [place] [address] [phone]                Discharge Instructions     AMB Referral to Cardiac Rehabilitation - Phase II   Complete by: As directed    Diagnosis: STEMI   After initial evaluation and assessments completed: Virtual Based Care may be provided alone or in conjunction with Phase 2 Cardiac Rehab based on patient barriers.: Yes   Call MD for:  difficulty breathing, headache or visual disturbances   Complete by: As directed    Call MD for:  persistant dizziness or light-headedness   Complete by: As directed    Call MD for:  redness, tenderness, or signs of infection (pain, swelling, redness, odor or green/yellow discharge around incision site)   Complete by: As directed    Diet - low sodium heart healthy   Complete by: As directed    Discharge instructions   Complete by: As directed    Radial Site Care Refer to this sheet in the next few weeks. These instructions provide you with information on caring for yourself after your procedure. Your caregiver may also give you more specific instructions. Your treatment has been planned according to current medical practices, but problems sometimes occur. Call your caregiver if you have  any problems or questions after your procedure. HOME CARE INSTRUCTIONS You may shower the day after the procedure. Remove the bandage (dressing) and gently wash the site with plain soap and water. Gently pat the site dry.  Do not apply powder or lotion to the site.  Do not submerge the affected site in water for 3 to 5 days.  Inspect the site at least twice daily.  Do not flex or bend the affected arm for 24 hours.  No lifting over 5 pounds (2.3 kg) for 5 days after your procedure.  Do not drive home if you are discharged the same day of the procedure. Have someone else drive you.  You may drive 24 hours after the procedure unless otherwise instructed by your caregiver.  What to expect: Any bruising will usually fade within 1 to 2 weeks.  Blood that collects in the tissue (hematoma) may be painful to the touch. It should usually decrease in size and tenderness within 1 to 2 weeks.  SEEK IMMEDIATE MEDICAL CARE IF: You have unusual pain at the radial site.  You have redness, warmth, swelling, or pain at the radial site.  You have drainage (other than a small amount of blood on the dressing).  You have chills.  You have a fever or persistent symptoms for more than 72 hours.  You have a fever and your symptoms suddenly get worse.  Your arm becomes pale, cool, tingly, or numb.  You have heavy bleeding from the site. Hold pressure on the site.   PLEASE DO NOT MISS ANY DOSES OF YOUR BRILINTA!!!!! Also keep a log of you blood pressures and bring back to your follow up appt. Please call the office with any questions.   Patients taking blood thinners should generally stay away from medicines like ibuprofen, Advil, Motrin, naproxen, and Aleve due to risk of stomach bleeding. You may take Tylenol as directed or talk to your primary doctor about alternatives.  PLEASE  ENSURE THAT YOU DO NOT RUN OUT OF YOUR BRILINTA. This medication is very important to remain on for at least one year. IF you have  issues obtaining this medication due to cost please CALL the office 3-5 business days prior to running out in order to prevent missing doses of this medication.   For home use only DME Vest life vest   Complete by: As directed    Length of need- 3 months   Increase activity slowly   Complete by: As directed    No wound care   Complete by: As directed        Discharge Medications   Allergies as of 09/13/2021   No Known Allergies      Medication List     STOP taking these medications    amLODipine 2.5 MG tablet Commonly known as: NORVASC   furosemide 20 MG tablet Commonly known as: LASIX   ibuprofen 200 MG tablet Commonly known as: ADVIL   potassium chloride SA 20 MEQ tablet Commonly known as: KLOR-CON   TURMERIC PO   valsartan 320 MG tablet Commonly known as: DIOVAN       TAKE these medications    acetaminophen 500 MG tablet Commonly known as: TYLENOL Take 2,000 mg by mouth every 6 (six) hours as needed for pain.   albuterol 108 (90 Base) MCG/ACT inhaler Commonly known as: VENTOLIN HFA Inhale 1-2 puffs into the lungs every 6 (six) hours as needed for wheezing or shortness of breath.   allopurinol 300 MG tablet Commonly known as: ZYLOPRIM Take 300 mg by mouth daily.   aspirin 81 MG EC tablet Take 81 mg by mouth daily.   atorvastatin 80 MG tablet Commonly known as: LIPITOR Take 1 tablet (80 mg total) by mouth daily. Start taking on: September 14, 2021   Breo Ellipta 100-25 MCG/ACT Aepb Generic drug: fluticasone furoate-vilanterol TAKE 1 PUFF BY MOUTH EVERY DAY   carvedilol 3.125 MG tablet Commonly known as: COREG Take 1 tablet (3.125 mg total) by mouth 2 (two) times daily with a meal.   Coenzyme Q10 400 MG Caps Take 400 mg by mouth daily.   docusate sodium 100 MG capsule Commonly known as: Colace Take 1 capsule (100 mg total) by mouth 2 (two) times daily. What changed:  when to take this reasons to take this   doxazosin 4 MG  tablet Commonly known as: CARDURA Take 4 mg by mouth 2 (two) times daily.   Entresto 24-26 MG Generic drug: sacubitril-valsartan Take 1 tablet by mouth 2 (two) times daily.   loratadine 10 MG tablet Commonly known as: CLARITIN Take 10 mg by mouth daily as needed for allergies.   magnesium gluconate 500 MG tablet Commonly known as: MAGONATE Take 500 mg by mouth daily.   nitroGLYCERIN 0.4 MG SL tablet Commonly known as: NITROSTAT Place 1 tablet (0.4 mg total) under the tongue every 5 (five) minutes x 3 doses as needed for chest pain.   OMEGA 3 PO Take 1 capsule by mouth daily.   spironolactone 25 MG tablet Commonly known as: ALDACTONE Take 0.5 tablets (12.5 mg total) by mouth daily. Start taking on: September 14, 2021   Testosterone 20.25 MG/ACT (1.62%) Gel Apply 1 application topically daily.   ticagrelor 90 MG Tabs tablet Commonly known as: BRILINTA Take 1 tablet (90 mg total) by mouth 2 (two) times daily.   VITAMIN C PO Take 1 tablet by mouth daily.  Durable Medical Equipment  (From admission, onward)           Start     Ordered   09/13/21 0000  For home use only DME Vest life vest       Comments: Length of need- 3 months   09/13/21 1127            Outstanding Labs/Studies   FLP/LFTs in 8 weeks BMET at follow up  Echo in 3 months  Duration of Discharge Encounter   Greater than 30 minutes including physician time.  Signed, Reino Bellis, NP 09/13/2021, 11:32 AM  Patient seen, examined. Available data reviewed. Agree with findings, assessment, and plan as outlined by Reino Bellis, NP.  The patient is independently interviewed and examined.  His wife is at the bedside.  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  LVEF is 30 to 35%.  On my exam, he is alert, oriented, obese gentleman in no distress.  JVP is normal, lungs are clear, heart is regular rate and rhythm with no murmur gallop, abdomen is soft  and nontender, radial cath site is clear, lower extremities have no significant edema, skin is warm and dry with no rash.  The patient's telemetry is reviewed and shows normal sinus rhythm with no PVCs or ventricular arrhythmia.  However, after a large anterior infarction with LVEF less than 35%, I think a referral for a wearable defibrillator is appropriate while we assess recovery of LV function in 3 months.  I discussed a LifeVest with the patient and his wife.  They understand the rationale for this and would like to pursue this prior to discharge.  I have reviewed the patient's medical therapy which I think is appropriate and it is outlined above.  He is medically stable for discharge when she he is fitted for a LifeVest.  Outpatient follow-up is arranged as above.  Sherren Mocha, M.D. 09/13/2021 1:19 PM

## 2021-09-13 NOTE — TOC Transition Note (Signed)
Transition of Care [facility]) - CM/SW Discharge Note   Patient Details  Name: Walter Anderson MRN: [id number] Date of Birth: September 30, 1946  Transition of Care [facility] CM/SW Contact:  Lawerance Sabal, RN Phone Number: 09/13/2021, 11:55 AM   Clinical Narrative:    Plan for DC to home today.  DC meds filed through [facility] pharmacy, please ensure patient goes home with medications in hand. Life Vest referral faxed to liaison. Once authed she will come to hospital to deliver it and fit patient before he goes home. Bedside nurse messaged to remind her patient cannot go home until he has vest on. No other TOC needs identified.     Final next level of care: Home/Self Care Barriers to Discharge: No Barriers Identified   Patient Goals and CMS Choice        Discharge Placement                       Discharge Plan and Services                DME Arranged: Life vest DME Agency: [employer] Date DME Agency Contacted: 09/13/21 Time DME Agency Contacted: 1155 Representative spoke with at DME Agency: ellen            Social Determinants of Health (SDOH) Interventions     Readmission Risk Interventions No flowsheet data found.

## 2021-09-13 NOTE — TOC CM/SW Note (Signed)
HF TOC CM spoke to [employer] rep, Alvino Chapel and pt will have Technical sales engineer. She has received paperwork for [employer]. Will process insurance and deliver to pt's room today. Isidoro Donning RN3 CCM, Heart Failure TOC CM [phone]

## 2021-09-13 NOTE — Progress Notes (Signed)
Life vest has been delivered.  Pt discharged to home with wife and family friend.

## 2021-09-13 NOTE — Progress Notes (Signed)
Heart Failure Nurse Navigator Progress Note  PCP: Lonie Peak, PA-C PCP-Cardiologist: Erlene Quan., MD Admission Diagnosis: SMEMI Admitted from: home with spouse  Presentation:   Walter Anderson presented 10/24 with chest pain, STEMI noted. Pt underwent LHC with PCI to LAD. Pt will DC with LifeVest. Awaiting rep for fitting. Pt sitting on EOB with spouse present. Pt on room air, interactive with interview process. Pt ambulated with cardiac rehab without assistive devices, plans for OP cardiac rehab at [facility]. Pt agreeable to [facility] clinic appt as he is started on new HF medications per GDMT.  Pt is semi-retired as a Arboriculturist. Pt understands he is not to return to work at this time, must be cleared by cardiology team. Pt understands he is not to drive until cleared by cardiology team. Pt understands he must wear the lifevest at all times except showering. Reviewed medications including SL NTG tablets. Explained medications may be titrated at next appointments.   ECHO/ LVEF: 30-35% 08/2019 55-60%  Clinical Course:  Past Medical History:  Diagnosis Date   Allergy    Arthritis    Asthma    Hypertension    Sleep apnea    uses CPAP     Social History   Socioeconomic History   Marital status: Married    Spouse name: Lavontae Cornia   Number of children: 2   Years of education: Not on file   Highest education level: Associate degree: occupational, Scientist, product/process development, or vocational program  Occupational History   Occupation: heavy Media planner  Tobacco Use   Smoking status: Never   Smokeless tobacco: Former    Types: Chew    Quit date: 11/15/2005  Vaping Use   Vaping Use: Never used  Substance and Sexual Activity   Alcohol use: Yes    Alcohol/week: 4.0 standard drinks    Types: 4 Shots of liquor per week    Comment: 1-2 Weekly   Drug use: No   Sexual activity: Not Currently  Other Topics Concern   Not on file  Social History Narrative   Not on file   Social  Determinants of Health   Financial Resource Strain: Low Risk    Difficulty of Paying Living Expenses: Not very hard  Food Insecurity: No Food Insecurity   Worried About Running Out of Food in the Last Year: Never true   Ran Out of Food in the Last Year: Never true  Transportation Needs: No Transportation Needs   Lack of Transportation (Medical): No   Lack of Transportation (Non-Medical): No  Physical Activity: Not on file  Stress: Not on file  Social Connections: Not on file    High Risk Criteria for Readmission and/or Poor Patient Outcomes: Heart failure hospital admissions (last 6 months): 1  No Show rate: 0% Difficult social situation: no Demonstrates medication adherence: yes Primary Language: English Literacy level: able to read/write and comprehend  Education Assessment and Provision:  Detailed education and instructions provided on heart failure disease management including the following:  Signs and symptoms of Heart Failure When to call the physician Importance of daily weights Low sodium diet Fluid restriction Medication management Anticipated future follow-up appointments  Patient education given on each of the above topics.  Patient acknowledges understanding via teach back method and acceptance of all instructions.  Education Materials:  "Living Better With Heart Failure" Booklet, HF zone tool, & Daily Weight Tracker Tool.  Patient has scale at home: yes Patient has pill box at home: yes   Barriers of  Care:   -new HF dx  Considerations/Referrals:   Referral made to Heart Failure Pharmacist Stewardship: no, pt DC Referral made to Heart Failure CSW/NCM TOC: yes, appreciated Referral made to Heart & Vascular TOC clinic: yes, 11/3 @ 9AM  Items for Follow-up on DC/TOC: -optimize -HF education -new cardiology Allyson Sabal, [place])   Ozella Rocks, MSN, RN Heart Failure Nurse Navigator [phone]

## 2021-09-13 NOTE — Telephone Encounter (Signed)
Hello Walter Anderson,  The Pharmacy team is conducting a discharge transitions of care quality improvement initiative. The recommendations below are for your consideration.    Walter Anderson is a 75 y.o. male (MRN: [id number], DOB: 1946-02-15) who was recently hospitalized on 09/11/2021 for ACS. They are anticipated to visit your clinic for post-discharge follow-up and may benefit from assistance with medication initiation and/or access.     Relevant medication access issues which may benefit from further intervention include: high medication costs  Please consider the following therapy recommendations at follow-up appointment below:   -Patient would benefit from Jardiance at a later time. We discussed medication costs with $40 copays for brand medications. We chose to switch to entresto over valsartan at discharge.  He had adequate blood pressure in the hospital, but did not titrate meds further due to the addition of entresto. He would likely benefit from up titration of his GDMT as his blood pressure tolerates.  -New HFrEF - follow volume status closely as he is only on as needed lasix.  -I asked patient to look into a medicare part D plan that may have better medication coverage.   Other relevant medication issues from their recent admission include:n/a   We appreciate your assistance with the implementation of these recommendations. Please let us know if there is anything we can help you with at this time.       Thanks!   Walter Anderson PharmD., BCPS Clinical Pharmacist 09/13/2021 10:25 AM

## 2021-09-13 NOTE — Progress Notes (Signed)
CARDIAC REHAB PHASE I   HF education completed with pt and wife. Pt educated on importance of daily weights and monitoring symptoms. Encouraged low sodium diet. Reviewed site care, restrictions, and exercise guidelines. Pt and wife deny further questions or concerns at this time, grateful for the care they have received. Waiting on LifeVest and TOC Pharmacy. Referred to [facility].  [id number] Reynold Bowen, RN BSN 09/13/2021 11:35 AM

## 2021-09-14 ENCOUNTER — Telehealth: Payer: Self-pay | Admitting: Cardiovascular Disease

## 2021-09-14 MED ORDER — SPIRONOLACTONE 25 MG PO TABS: 12.5000 mg | ORAL_TABLET | Freq: Every day | ORAL | 1 refills | Status: DC

## 2021-09-14 NOTE — Telephone Encounter (Signed)
Patient contacted regarding discharge from [facility] on 09/13/21.  Patient understands to follow up with provider J.Cleaver NP on 09/27/21 at 2:45pm at [facility]. Patient understands discharge instructions? YES Patient understands medications and regiment? YES Patient understands to bring all medications to this visit? YES  MyChart active  Needed refill of spironolactone to [facility] in [place]  Rx(s) sent to pharmacy electronically.

## 2021-09-14 NOTE — Telephone Encounter (Signed)
-----   Message from Arty Baumgartner, NP sent at 09/13/2021  2:08 PM EDT ----- Regarding: Needs TOC call Needs TOC call please

## 2021-09-20 NOTE — Progress Notes (Signed)
HEART & VASCULAR TRANSITION OF CARE CONSULT NOTE     Referring Physician: Dr Allyson Sabal  Primary Care: Walter Peak PA  Primary Cardiologist: Dr Allyson Sabal   HPI: Referred to clinic by Dr Allyson Sabal  for heart failure consultation.   [name] is a 75 year old with a history of  hypertension, asthma,S/P R hip replacement 2018,  obstructive sleep apnea on CPAP, CAD and recent STEMI requiring DES (08/2421).   Admitted with STEMI 09/11/21. Underwent cath and had DES . Echo showed EF had gone down from 55-60% -->30-35%.  Placed on DAPT with aspirin/brillinta for a least 1 year. Started on GDMT with exception of jardiance. Discharged on 09/13/21.   Overall feeling ok. Complaining a fatigue. SOB with steps. Denies PND/Orthopnea. Chronically sleeps with HOB. No chest pain. No bleeding issues.  Appetite ok. Following low salt diet. No fever or chills. Weight at home trending down 288-->281  pounds. Using CPAP nightly. Taking all medications. Lives with his wife.   ZOLL Interrogation: Average heart rate 62, Steps 1734, no shocks.   Cardiac Testing  LHC 09/11/21 mid septum into apex akinetic c/w large LAD infarction. mid LAD treated with aspiration thrombectomy along with PCI/DES x1.    Echo 08/2021 EF 35% RV normal.  Echo 08/23/21 EF 55--60%   Review of Systems: [y] = yes, [ ]  = no   General: Weight gain [ ] ; Weight loss [ ] ; Anorexia [ ] ; Fatigue [ Y]; Fever [ ] ; Chills [ ] ; Weakness [ ]   Cardiac: Chest pain/pressure [ ] ; Resting SOB [ ] ; Exertional SOB [ Y]; Orthopnea [ ] ; Pedal Edema [ ] ; Palpitations [ ] ; Syncope [ ] ; Presyncope [ ] ; Paroxysmal nocturnal dyspnea[ ]   Pulmonary: Cough [ ] ; Wheezing[ ] ; Hemoptysis[ ] ; Sputum [ ] ; Snoring [ ]   GI: Vomiting[ ] ; Dysphagia[ ] ; Melena[ ] ; Hematochezia [ ] ; Heartburn[ ] ; Abdominal pain [ ] ; Constipation [ ] ; Diarrhea [ ] ; BRBPR [ ]   GU: Hematuria[ ] ; Dysuria [ ] ; Nocturia[ ]   Vascular: Pain in legs with walking [ ] ; Pain in feet with lying flat [ ] ;  Non-healing sores [ ] ; Stroke [ ] ; TIA [ ] ; Slurred speech [ ] ;  Neuro: Headaches[ ] ; Vertigo[ ] ; Seizures[ ] ; Paresthesias[ ] ;Blurred vision [ ] ; Diplopia [ ] ; Vision changes [ ]   Ortho/Skin: Arthritis [ ] ; Joint pain [ Y]; Muscle pain [ ] ; Joint swelling [ ] ; Back Pain [ Y]; Rash [ Y]  Psych: Depression[ ] ; Anxiety[ ]   Heme: Bleeding problems [ ] ; Clotting disorders [ ] ; Anemia [ ]   Endocrine: Diabetes [ ] ; Thyroid dysfunction[ ]    Past Medical History:  Diagnosis Date   Allergy    Arthritis    Asthma    Hypertension    Sleep apnea    uses CPAP    Current Outpatient Medications  Medication Sig Dispense Refill   acetaminophen (TYLENOL) 500 MG tablet Take 2,000 mg by mouth every 6 (six) hours as needed for pain.     allopurinol (ZYLOPRIM) 300 MG tablet Take 300 mg by mouth daily as needed.     amLODipine (NORVASC) 2.5 MG tablet Take 2.5 mg by mouth daily.     Ascorbic Acid (VITAMIN C PO) Take 1 tablet by mouth daily.     aspirin 81 MG EC tablet Take 81 mg by mouth daily.     atorvastatin (LIPITOR) 80 MG tablet Take 1 tablet (80 mg total) by mouth daily. 90 tablet 1   carvedilol (COREG) 3.125 MG  tablet Take 1 tablet (3.125 mg total) by mouth 2 (two) times daily with a meal. 180 tablet 0   Coenzyme Q10 400 MG CAPS Take 400 mg by mouth daily.      docusate sodium (COLACE) 100 MG capsule Take 1 capsule (100 mg total) by mouth 2 (two) times daily. (Patient taking differently: Take 100 mg by mouth daily as needed for mild constipation.) 10 capsule 0   doxazosin (CARDURA) 4 MG tablet Take 4 mg by mouth 2 (two) times daily.      loratadine (CLARITIN) 10 MG tablet Take 10 mg by mouth daily as needed for allergies.     magnesium gluconate (MAGONATE) 500 MG tablet Take 500 mg by mouth daily.     nitroGLYCERIN (NITROSTAT) 0.4 MG SL tablet Place 1 tablet (0.4 mg total) under the tongue every 5 (five) minutes x 3 doses as needed for chest pain. 25 tablet 2   Omega-3 Fatty Acids (OMEGA 3 PO) Take 1  capsule by mouth daily.     sacubitril-valsartan (ENTRESTO) 24-26 MG Take 1 tablet by mouth 2 (two) times daily. 180 tablet 1   spironolactone (ALDACTONE) 25 MG tablet Take 0.5 tablets (12.5 mg total) by mouth daily. 45 tablet 1   Testosterone 20.25 MG/ACT (1.62%) GEL Apply 1 application topically daily.     ticagrelor (BRILINTA) 90 MG TABS tablet Take 1 tablet (90 mg total) by mouth 2 (two) times daily. 180 tablet 2   No current facility-administered medications for this encounter.    No Known Allergies    Social History   Socioeconomic History   Marital status: Married    Spouse name: Walter Anderson   Number of children: 2   Years of education: Not on file   Highest education level: Associate degree: occupational, Hotel manager, or vocational program  Occupational History   Occupation: heavy Radio producer  Tobacco Use   Smoking status: Never   Smokeless tobacco: Former    Types: Chew    Quit date: 11/15/2005  Vaping Use   Vaping Use: Never used  Substance and Sexual Activity   Alcohol use: Yes    Alcohol/week: 4.0 standard drinks    Types: 4 Shots of liquor per week    Comment: 1-2 Weekly   Drug use: No   Sexual activity: Not Currently  Other Topics Concern   Not on file  Social History Narrative   Not on file   Social Determinants of Health   Financial Resource Strain: Low Risk    Difficulty of Paying Living Expenses: Not very hard  Food Insecurity: No Food Insecurity   Worried About Running Out of Food in the Last Year: Never true   Ran Out of Food in the Last Year: Never true  Transportation Needs: No Transportation Needs   Lack of Transportation (Medical): No   Lack of Transportation (Non-Medical): No  Physical Activity: Not on file  Stress: Not on file  Social Connections: Not on file  Intimate Partner Violence: Not on file      Family History  Problem Relation Age of Onset   Diabetes Mother    COPD Father    Heart disease Father     Vitals:    09/21/21 0908  BP: 114/70  Pulse: 63  SpO2: 97%  Weight: 130.4 kg   Wt Readings from Last 3 Encounters:  09/21/21 130.4 kg  09/12/21 132.3 kg  06/12/21 131.5 kg   Reds Clip 51%.  PHYSICAL EXAM: General:  Walked in the clinic.  No respiratory difficulty HEENT: normal Neck: supple. JVP 6-7  Carotids 2+ bilat; no bruits. No lymphadenopathy or thryomegaly appreciated. Cor: PMI nondisplaced. Regular rate & rhythm. No rubs, gallops or murmurs. Lungs: clear Abdomen: obese, soft, nontender, distended. No hepatosplenomegaly. No bruits or masses. Good bowel sounds. Extremities: no cyanosis, clubbing, rash, edema Neuro: alert & oriented x 3, cranial nerves grossly intact. moves all 4 extremities w/o difficulty. Affect pleasant.  ECG: Sinus Bradycardia 58 bpm    ASSESSMENT & PLAN: 1.HFrEF, ICM  09/12/21 Echo EF 35% . Plan to repeat ECHO in 3 months after HF meds optimized. Has Life Vest on- No shocks. Average heart rate 62 bpm. Continue until repeat ECHO.  -NYHA III. Reds Clip 51% but does not appear volume overloaded. Adding jardiance today. Volume mildly elevated. Given script to take lasix as needed for 3-4 pound weight gain in 24 hours.  - Continue coreg 3.125 mg twice a day . No room to increase with bradycardia.  - Continue entresto 24-26 mg twice a day.  - Continue spironolactone 12.5 mg daily - Add jardiance 10 mg daily . Discussed potential side effects.  - Check BMET in 7 days.  -Plan to repeat ECHO after HF meds optimized.   - Discussed low salt food choices and limiting fluid intake to < 2 liters daily.   2. CAD -Recent STEMI and underwent DES mid LAD.  - On high intensity statin + BB -Continue DAPT with aspirin/Brilinta for at least 1 year.  - No chest pain - Starting cardiac rehab at [facility].   3. HTN -Stable today. Cut back cardura 2 mg twice a day.  -Favor stopping cardura next visit and increasing entresto.   4. OSA -Continue CPAP nightly   5. Obesity -Body mass  index is 38.98 kg/m. -Discussed portion control.   Check BMET, BNP CBC. Will need BMET next week   Referred to HFSW:  No Refer to Pharmacy: Yes  Refer to Home Health:  No Refer to Advanced Heart Failure Clinic: Yes--> Dr Aundra Dubin  Refer to General Cardiology: Shared with [facility]   Follow up in 3 weeks with pharmacy and 6 weeks with APP and Dr Aundra Dubin in 12 weeks with an ECHO.    Walter Maulding NP-C  12:02 PM

## 2021-09-21 ENCOUNTER — Other Ambulatory Visit: Payer: Self-pay

## 2021-09-21 ENCOUNTER — Ambulatory Visit (HOSPITAL_COMMUNITY)
Admission: RE | Admit: 2021-09-21 | Discharge: 2021-09-21 | Disposition: A | Discharge status: Home / Self Care | Payer: Medicare PPO | Source: Ambulatory Visit | Attending: Adult Health | Admitting: Adult Health

## 2021-09-21 ENCOUNTER — Encounter (HOSPITAL_COMMUNITY): Payer: Self-pay

## 2021-09-21 ENCOUNTER — Other Ambulatory Visit (HOSPITAL_COMMUNITY): Payer: Self-pay

## 2021-09-21 VITALS — BP 114/70 | HR 63 | Wt 287.4 lb

## 2021-09-21 DIAGNOSIS — I10 Essential (primary) hypertension: Secondary | ICD-10-CM

## 2021-09-21 DIAGNOSIS — Z7982 Long term (current) use of aspirin: Secondary | ICD-10-CM | POA: Insufficient documentation

## 2021-09-21 DIAGNOSIS — I110 Hypertensive heart disease with heart failure: Secondary | ICD-10-CM | POA: Insufficient documentation

## 2021-09-21 DIAGNOSIS — I252 Old myocardial infarction: Secondary | ICD-10-CM | POA: Insufficient documentation

## 2021-09-21 DIAGNOSIS — Z79899 Other long term (current) drug therapy: Secondary | ICD-10-CM | POA: Diagnosis not present

## 2021-09-21 DIAGNOSIS — Z8249 Family history of ischemic heart disease and other diseases of the circulatory system: Secondary | ICD-10-CM | POA: Diagnosis not present

## 2021-09-21 DIAGNOSIS — Z87891 Personal history of nicotine dependence: Secondary | ICD-10-CM | POA: Insufficient documentation

## 2021-09-21 DIAGNOSIS — I5022 Chronic systolic (congestive) heart failure: Secondary | ICD-10-CM

## 2021-09-21 DIAGNOSIS — G4733 Obstructive sleep apnea (adult) (pediatric): Secondary | ICD-10-CM

## 2021-09-21 DIAGNOSIS — I2510 Atherosclerotic heart disease of native coronary artery without angina pectoris: Secondary | ICD-10-CM | POA: Diagnosis not present

## 2021-09-21 DIAGNOSIS — I255 Ischemic cardiomyopathy: Secondary | ICD-10-CM | POA: Diagnosis not present

## 2021-09-21 DIAGNOSIS — Z7902 Long term (current) use of antithrombotics/antiplatelets: Secondary | ICD-10-CM | POA: Insufficient documentation

## 2021-09-21 DIAGNOSIS — Z6838 Body mass index (BMI) 38.0-38.9, adult: Secondary | ICD-10-CM | POA: Diagnosis not present

## 2021-09-21 DIAGNOSIS — Z955 Presence of coronary angioplasty implant and graft: Secondary | ICD-10-CM | POA: Insufficient documentation

## 2021-09-21 DIAGNOSIS — Z9989 Dependence on other enabling machines and devices: Secondary | ICD-10-CM | POA: Diagnosis not present

## 2021-09-21 DIAGNOSIS — E669 Obesity, unspecified: Secondary | ICD-10-CM | POA: Diagnosis not present

## 2021-09-21 LAB — BASIC METABOLIC PANEL
Anion gap: 6 (ref 5–15)
BUN: 14 mg/dL (ref 8–23)
CO2: 26 mmol/L (ref 22–32)
Calcium: 9.3 mg/dL (ref 8.9–10.3)
Chloride: 107 mmol/L (ref 98–111)
Creatinine, Ser: 0.92 mg/dL (ref 0.61–1.24)
GFR, Estimated: >60 mL/min (ref >60)
Glucose, Bld: 127 mg/dL — ABNORMAL HIGH (ref 70–99)
Potassium: 4.9 mmol/L (ref 3.5–5.1)
Sodium: 139 mmol/L (ref 135–145)

## 2021-09-21 LAB — CBC
HCT: 44.8 % (ref 39.0–52.0)
Hemoglobin: 14.8 g/dL (ref 13.0–17.0)
MCH: 31.7 pg (ref 26.0–34.0)
MCHC: 33 g/dL (ref 30.0–36.0)
MCV: 95.9 fL (ref 80.0–100.0)
Platelets: 174 10*3/uL (ref 150–400)
RBC: 4.67 MIL/uL (ref 4.22–5.81)
RDW: 13.8 % (ref 11.5–15.5)
WBC: 8.3 10*3/uL (ref 4.0–10.5)
nRBC: 0 % (ref 0.0–0.2)

## 2021-09-21 LAB — BRAIN NATRIURETIC PEPTIDE: B Natriuretic Peptide: 146.4 pg/mL — ABNORMAL HIGH (ref 0.0–100.0)

## 2021-09-21 MED ORDER — FUROSEMIDE 40 MG PO TABS
40.0000 mg | ORAL_TABLET | Freq: Every day | ORAL | 3 refills | Status: DC | PRN
Start: 2021-09-21 — End: 2022-03-19

## 2021-09-21 MED ORDER — DOXAZOSIN MESYLATE 2 MG PO TABS: 2.0000 mg | ORAL_TABLET | Freq: Two times a day (BID) | ORAL | 3 refills | Status: DC

## 2021-09-21 MED ORDER — EMPAGLIFLOZIN 10 MG PO TABS
10.0000 mg | ORAL_TABLET | Freq: Every day | ORAL | 3 refills | Status: DC
Start: 2021-09-21 — End: 2022-06-25

## 2021-09-21 NOTE — Progress Notes (Signed)
ReDS Vest / Clip - 09/21/21 0900       ReDS Vest / Clip   Station Marker D    Ruler Value 32    ReDS Value Range High volume overload    ReDS Actual Value 51    Anatomical Comments sitting

## 2021-09-21 NOTE — Patient Instructions (Addendum)
EKG done today.  RedsClip done today.  Labs done today. We will contact you only if your labs are abnormal.  DECREASE Cardura to 2mg  (1 tablet) by mouth 2 times daily. (A new prescription was sent into the pharmacy)   START Jardiance 10mg  (1 tablet) by mouth daily.  START Lasix 40mg  (1 tablet) by mouth daily as needed for swelling or a wright gain of 3 pounds or more in 24 hours.   No other medication changes were made. Please continue all current medications as prescribed.  Your physician recommends that you schedule a follow-up appointment in: 3 weeks with our Clinic Pharmacist, 6 weeks with our NP/PA Clinic and in 12 weeks with Dr. (all appointments here in our office)  If you have any questions or concerns before your next appointment please send a message through [facility] or call our office at [phone].    TO LEAVE A MESSAGE FOR THE NURSE SELECT OPTION 2, PLEASE LEAVE A MESSAGE INCLUDING: YOUR NAME DATE OF BIRTH CALL BACK NUMBER REASON FOR CALL**this is important as we prioritize the call backs  YOU WILL RECEIVE A CALL BACK THE SAME DAY AS LONG AS YOU CALL BEFORE 4:00 PM   Do the following things EVERYDAY: Weigh yourself in the morning before breakfast. Write it down and keep it in a log. Take your medicines as prescribed Eat low salt foods--Limit salt (sodium) to 2000 mg per day.  Stay as active as you can everyday Limit all fluids for the day to less than 2 liters   At the Advanced Heart Failure Clinic, you and your health needs are our priority. As part of our continuing mission to provide you with exceptional heart care, we have created designated Provider Care Teams. These Care Teams include your primary Cardiologist (physician) and Advanced Practice Providers (APPs- Physician Assistants and Nurse Practitioners) who all work together to provide you with the care you need, when you need it.   You may see any of the following providers on your designated Care  Team at your next follow up: Dr [place] Dr Warren Grant, NP [phone], Arvilla Meres Carron Curie, PharmD   Please be sure to bring in all your medications bottles to every appointment.

## 2021-09-22 ENCOUNTER — Telehealth: Payer: Self-pay | Admitting: Pharmacist

## 2021-09-22 ENCOUNTER — Ambulatory Visit: Payer: Medicare PPO | Admitting: General Practice

## 2021-09-22 NOTE — Telephone Encounter (Signed)
Transitions of Care Pharmacy  ° °Call attempted for a pharmacy transitions of care follow-up.  ° °Call attempt #1. Will follow-up in 2-3 days.  °  °

## 2021-09-25 NOTE — Progress Notes (Signed)
Cardiology Clinic Note   Patient Name: Walter Anderson Date of Encounter: 09/27/2021  Primary Care Provider:  Cyndi Bender, PA-C Primary Cardiologist:  Quay Burow, MD  Patient Profile    Walter Anderson 75 year old male presents the clinic today for follow-up evaluation of his chronic systolic CHF.  Past Medical History    Past Medical History:  Diagnosis Date   Allergy    Arthritis    Asthma    Hypertension    Sleep apnea    uses CPAP   Past Surgical History:  Procedure Laterality Date   BACK SURGERY  2006   CHOLECYSTECTOMY N/A 11/15/2015   Procedure: LAPAROSCOPIC CHOLECYSTECTOMY WITH INTRAOPERATIVE CHOLANGIOGRAM;  Surgeon: Christene Lye, MD;  Location: [facility];  Service: General;  Laterality: N/A;   CORONARY/GRAFT ACUTE MI REVASCULARIZATION N/A 09/11/2021   Procedure: Coronary/Graft Acute MI Revascularization;  Surgeon: Lorretta Harp, MD;  Location: [facility];  Service: Cardiovascular;  Laterality: N/A;   FINGER ARTHROSCOPY  1990   3rd Finger Left Hand   LEFT HEART CATH AND CORONARY ANGIOGRAPHY N/A 09/11/2021   Procedure: LEFT HEART CATH AND CORONARY ANGIOGRAPHY;  Surgeon: Lorretta Harp, MD;  Location: [facility];  Service: Cardiovascular;  Laterality: N/A;   ROTATOR CUFF REPAIR     right shoulder   TONSILLECTOMY  74 years old   TOTAL HIP ARTHROPLASTY Right 04/01/2018   Procedure: RIGHT TOTAL HIP ARTHROPLASTY ANTERIOR APPROACH;  Surgeon: Paralee Cancel, MD;  Location: [facility];  Service: Orthopedics;  Laterality: Right;  70 mins   UMBILICAL HERNIA REPAIR  11/15/2015   Procedure: HERNIA REPAIR UMBILICAL ADULT;  Surgeon: Christene Lye, MD;  Location: [facility];  Service: General;;    Allergies  No Known Allergies  History of Present Illness    Walter Anderson has a PMH of HTN, asthma, hip replacement 2018, OSA on CPAP, coronary artery disease status post STEMI with DES 09/11/2021, ischemic cardiomyopathy, and chronic systolic  CHF.  He presented with STEMI 09/11/2021.  He underwent cardiac catheterization and received DES x1 to his mid LAD.  His follow-up echocardiogram showed an LVEF of 30-35% which was down from previous echocardiogram showing 55-60%.  He was placed on aspirin and Brilinta and started on Jardiance.  He followed up with the advanced heart failure clinic 09/21/2021.  During that time he reported that he was feeling okay.  He complained of fatigue.  He did note shortness of breath with ambulation.  He denied orthopnea and PND.  Reported that he slept with elevated head of bed chronically.  He denied chest pain and bleeding issues.  He reported following a low-sodium diet.  His weight had trended down from 288 to 281 pounds.  He reported compliance with his CPAP and medications.  He was continued on Entresto 24-26, spironolactone 12.5 mg daily, Jardiance 10 mg daily was added to his medication regimen and repeat echocardiogram was planned for 3 months.  He presents the clinic today for follow-up evaluation states he feels well.  He is tolerating his medications without side effects and reports compliance.  We reviewed his angiography and he and his wife expressed understanding.  His blood pressure today is 110/64.  His right radial cath site is clean dry intact and has no signs of infection.  He has been slowly increasing his physical activity and reports that he is ready to start rabbit hunting without his gun.  At this time we will pause on up titration of  his medication due to his recent titration.  We will plan to see him back in 1 month to further increase his medication.  I will give him a salty 6 diet sheet and plan follow-up for 1 month.  His EKG today shows normal sinus rhythm inferior infarct undetermined age, anterolateral infarct undetermined age 79 bpm.  We also reviewed his previous abdomen/pelvis CT which shows a atrophied scarred left kidney.  Today he denies chest pain, shortness of breath, lower  extremity edema, fatigue, palpitations, melena, hematuria, hemoptysis, diaphoresis, weakness, presyncope, syncope, orthopnea, and PND.   Home Medications    Prior to Admission medications   Medication Sig Start Date End Date Taking? Authorizing Provider  acetaminophen (TYLENOL) 500 MG tablet Take 2,000 mg by mouth every 6 (six) hours as needed for pain.    [provider]  allopurinol (ZYLOPRIM) 300 MG tablet Take 300 mg by mouth daily as needed. 07/06/21   [provider]  amLODipine (NORVASC) 2.5 MG tablet Take 2.5 mg by mouth daily.    [provider]  Ascorbic Acid (VITAMIN C PO) Take 1 tablet by mouth daily.    [provider]  aspirin 81 MG EC tablet Take 81 mg by mouth daily.    [provider]  atorvastatin (LIPITOR) 80 MG tablet Take 1 tablet (80 mg total) by mouth daily. 09/14/21   Arty Baumgartner, NP  carvedilol (COREG) 3.125 MG tablet Take 1 tablet (3.125 mg total) by mouth 2 (two) times daily with a meal. 09/13/21   Laverda Page B, NP  Coenzyme Q10 400 MG CAPS Take 400 mg by mouth daily.     [provider]  docusate sodium (COLACE) 100 MG capsule Take 1 capsule (100 mg total) by mouth 2 (two) times daily. Patient taking differently: Take 100 mg by mouth daily as needed for mild constipation. 04/01/18   Lanney Gins, PA-C  doxazosin (CARDURA) 2 MG tablet Take 1 tablet (2 mg total) by mouth 2 (two) times daily. 09/21/21   Clegg, Amy D, NP  empagliflozin (JARDIANCE) 10 MG TABS tablet Take 1 tablet (10 mg total) by mouth daily before breakfast. 09/21/21   Clegg, Amy D, NP  furosemide (LASIX) 40 MG tablet Take 1 tablet (40 mg total) by mouth daily as needed for fluid or edema. 09/21/21 09/21/22  Clegg, Amy D, NP  loratadine (CLARITIN) 10 MG tablet Take 10 mg by mouth daily as needed for allergies.    [provider]  magnesium gluconate (MAGONATE) 500 MG tablet Take 500 mg by mouth daily.    [provider]   nitroGLYCERIN (NITROSTAT) 0.4 MG SL tablet Place 1 tablet (0.4 mg total) under the tongue every 5 (five) minutes x 3 doses as needed for chest pain. 09/13/21   Arty Baumgartner, NP  Omega-3 Fatty Acids (OMEGA 3 PO) Take 1 capsule by mouth daily.    [provider]  sacubitril-valsartan (ENTRESTO) 24-26 MG Take 1 tablet by mouth 2 (two) times daily. 09/13/21   Arty Baumgartner, NP  spironolactone (ALDACTONE) 25 MG tablet Take 0.5 tablets (12.5 mg total) by mouth daily. 09/14/21   Runell Gess, MD  Testosterone 20.25 MG/ACT (1.62%) GEL Apply 1 application topically daily. 09/08/21   [provider]  ticagrelor (BRILINTA) 90 MG TABS tablet Take 1 tablet (90 mg total) by mouth 2 (two) times daily. 09/13/21   Arty Baumgartner, NP    Family History    Family History  Problem Relation Age  of Onset   Diabetes Mother    COPD Father    Heart disease Father    He indicated that his mother is deceased. He indicated that his father is deceased.  Social History    Social History   Socioeconomic History   Marital status: Married    Spouse name: Gwyn Lehne   Number of children: 2   Years of education: Not on file   Highest education level: Associate degree: occupational, Hotel manager, or vocational program  Occupational History   Occupation: heavy Radio producer  Tobacco Use   Smoking status: Never   Smokeless tobacco: Former    Types: Chew    Quit date: 11/15/2005  Vaping Use   Vaping Use: Never used  Substance and Sexual Activity   Alcohol use: Yes    Alcohol/week: 4.0 standard drinks    Types: 4 Shots of liquor per week    Comment: 1-2 Weekly   Drug use: No   Sexual activity: Not Currently  Other Topics Concern   Not on file  Social History Narrative   Not on file   Social Determinants of Health   Financial Resource Strain: Low Risk    Difficulty of Paying Living Expenses: Not very hard  Food Insecurity: No Food Insecurity   Worried About  Running Out of Food in the Last Year: Never true   Ran Out of Food in the Last Year: Never true  Transportation Needs: No Transportation Needs   Lack of Transportation (Medical): No   Lack of Transportation (Non-Medical): No  Physical Activity: Not on file  Stress: Not on file  Social Connections: Not on file  Intimate Partner Violence: Not on file     Review of Systems    General:  No chills, fever, night sweats or weight changes.  Cardiovascular:  No chest pain, dyspnea on exertion, edema, orthopnea, palpitations, paroxysmal nocturnal dyspnea. Dermatological: No rash, lesions/masses Respiratory: No cough, dyspnea Urologic: No hematuria, dysuria Abdominal:   No nausea, vomiting, diarrhea, bright red blood per rectum, melena, or hematemesis Neurologic:  No visual changes, wkns, changes in mental status. All other systems reviewed and are otherwise negative except as noted above.  Physical Exam    VS:  BP 110/64   Pulse 66   Ht 6' (1.829 m)   Wt 280 lb 9.6 oz (127.3 kg)   SpO2 98%   BMI 38.06 kg/m  , BMI Body mass index is 38.06 kg/m. GEN: Well nourished, well developed, in no acute distress. HEENT: normal. Neck: Supple, no JVD, carotid bruits, or masses. Cardiac: RRR, no murmurs, rubs, or gallops. No clubbing, cyanosis, edema.  Radials/DP/PT 2+ and equal bilaterally.  Respiratory:  Respirations regular and unlabored, clear to auscultation bilaterally. GI: Soft, nontender, nondistended, BS + x 4. MS: no deformity or atrophy. Skin: warm and dry, no rash.  Right radial cath site clean dry intact no drainage, no signs of infection. Neuro:  Strength and sensation are intact. Psych: Normal affect.  Accessory Clinical Findings    Recent Labs: 09/11/2021: ALT 20 09/21/2021: B Natriuretic Peptide 146.4; BUN 14; Creatinine, Ser 0.92; Hemoglobin 14.8; Platelets 174; Potassium 4.9; Sodium 139   Recent Lipid Panel    Component Value Date/Time   CHOL 156 09/12/2021 0139   TRIG  204 (H) 09/12/2021 0139   HDL 29 (L) 09/12/2021 0139   CHOLHDL 5.4 09/12/2021 0139   VLDL 41 (H) 09/12/2021 0139   LDLCALC 86 09/12/2021 0139    ECG personally reviewed by me today-normal  sinus rhythm left axis deviation inferior infarct undetermined age anterior lateral infarct undetermined age 74 bpm- No acute changes  Cardiac catheterization 09/11/2021   Mid LAD lesion is 100% stenosed.   A drug-eluting stent was successfully placed using a STENT ONYX FRONTIER 3.0X15.   Post intervention, there is a 0% residual stenosis.   Walter Anderson is a 75 y.o. male      [id number] LOCATION:  FACILITY: [facility]  PHYSICIAN: Quay Burow, M.D. 1946-02-20     DATE OF PROCEDURE:  09/11/2021  Diagnostic Dominance: Left Intervention    Echocardiogram 09/12/2021  IMPRESSIONS     1. Mid septum into apex is akinetic consistent with large LAD infarction.  No LV thrombus on contrast imaging. Left ventricular ejection fraction, by  estimation, is 30 to 35%. The left ventricle has moderately decreased  function. The left ventricle  demonstrates regional wall motion abnormalities (see scoring  diagram/findings for description). There is mild left ventricular  hypertrophy. Left ventricular diastolic parameters are consistent with  Grade II diastolic dysfunction (pseudonormalization).   2. Right ventricular systolic function is normal. The right ventricular  size is normal. Tricuspid regurgitation signal is inadequate for assessing  PA pressure.   3. The mitral valve is grossly normal. Trivial mitral valve  regurgitation. No evidence of mitral stenosis.   4. The aortic valve is tricuspid. There is moderate calcification of the  aortic valve. There is mild thickening of the aortic valve. Aortic valve  regurgitation is not visualized. Mild to moderate aortic valve  sclerosis/calcification is present, without  any evidence of aortic stenosis.   5. The inferior vena cava is dilated in size with  >50% respiratory  variability, suggesting right atrial pressure of 8 mmHg.   Comparison(s): Changes from prior study are noted. The left ventricular  function is significantly worse. New WMAs consistent with LAD infarction.   Assessment & Plan   1.   STEMI-denies recurrent episodes of chest discomfort since discharge from hospital.  Underwent cardiac catheterization 09/11/2021 and received DES x1 to his LAD.  Echocardiogram showed a reduction in his ejection fraction down to 30-35%. Continue aspirin, Brilinta, amlodipine, atorvastatin, nitroglycerin Heart healthy low-sodium diet-salty 6 given Increase physical activity as tolerated  Chronic systolic CHF-reports he is slowly increasing his physical activity and denies increased dyspnea.  Repeat echocardiogram showed decreased EF.  Details above.  Blood pressure at this time will not tolerate further up titration of guideline directed medical therapy. Continue Jardiance, furosemide, Entresto, spironolactone, carvedilol Heart healthy low-sodium diet-salty 6 given Increase physical activity as tolerated Follows with advanced heart failure clinic Recommend repeat echocardiogram and 3 months.  Hyperlipidemia-09/12/2021: Cholesterol 156; HDL 29; LDL Cholesterol 86; Triglycerides 204; VLDL 41 Continue aspirin, atorvastatin Heart healthy low-sodium diet-salty 6 given Increase physical activity as tolerated  Essential hypertension-BP today 110/64.  Well-controlled at home. Continue amlodipine, carvedilol, doxazosin, Entresto, spironolactone Heart healthy low-sodium diet-salty 6 given Increase physical activity as tolerated  OSA-reports compliance with CPAP.  Waking up well rested. Continue CPAP use.  Morbid obesity-weight today 280.  Continues to lose weight. Increase physical activity as tolerated Continue heart healthy low-sodium diet Continue weight loss  Disposition: Follow-up with Dr. Gwenlyn Found or me in 1 months.  Jossie Ng. Marque Bango  NP-C    09/27/2021, 3:03 PM [place] [address] Office [phone] Fax [phone]  Notice: This dictation was prepared with Dragon dictation along with smaller phrase technology. Any transcriptional errors that result from this process are unintentional  and may not be corrected upon review.  I spent 14 minutes examining this patient, reviewing medications, and using patient centered shared decision making involving her cardiac care.  Prior to her visit I spent greater than 20 minutes reviewing her past medical history,  medications, and prior cardiac tests.

## 2021-09-27 ENCOUNTER — Encounter: Payer: Self-pay | Admitting: General Practice

## 2021-09-27 ENCOUNTER — Ambulatory Visit: Payer: Medicare PPO | Admitting: General Practice

## 2021-09-27 ENCOUNTER — Other Ambulatory Visit: Payer: Self-pay

## 2021-09-27 VITALS — BP 110/64 | HR 66 | Ht 72.0 in | Wt 280.6 lb

## 2021-09-27 DIAGNOSIS — Z9989 Dependence on other enabling machines and devices: Secondary | ICD-10-CM | POA: Diagnosis not present

## 2021-09-27 DIAGNOSIS — I5022 Chronic systolic (congestive) heart failure: Secondary | ICD-10-CM | POA: Diagnosis not present

## 2021-09-27 DIAGNOSIS — I10 Essential (primary) hypertension: Secondary | ICD-10-CM | POA: Diagnosis not present

## 2021-09-27 DIAGNOSIS — I2102 ST elevation (STEMI) myocardial infarction involving left anterior descending coronary artery: Secondary | ICD-10-CM | POA: Diagnosis not present

## 2021-09-27 DIAGNOSIS — E782 Mixed hyperlipidemia: Secondary | ICD-10-CM

## 2021-09-27 DIAGNOSIS — G4733 Obstructive sleep apnea (adult) (pediatric): Secondary | ICD-10-CM | POA: Diagnosis not present

## 2021-09-27 NOTE — Patient Instructions (Signed)
Medication Instructions:  The current medical regimen is effective;  continue present plan and medications as directed. Please refer to the Current Medication list given to you today.   *If you need a refill on your cardiac medications before your next appointment, please call your pharmacy*  Lab Work:   Testing/Procedures:  NONE    NONE  Special Instructions PLEASE READ AND FOLLOW SALTY 6-ATTACHED-1,800mg  daily  PLEASE INCREASE PHYSICAL ACTIVITY AS TOLERATED   CLEARED BACK TO NORMAL DAILY ACTIVITIES.  Follow-Up: Your next appointment:  1 month(s) In Person with Nanetta Batty, MD  or Edd Fabian, FNP     At [facility], you and your health needs are our priority.  As part of our continuing mission to provide you with exceptional heart care, we have created designated Provider Care Teams.  These Care Teams include your primary Cardiologist (physician) and Advanced Practice Providers (APPs -  Physician Assistants and Nurse Practitioners) who all work together to provide you with the care you need, when you need it.            6 SALTY THINGS TO AVOID     1,800MG  DAILY

## 2021-10-10 NOTE — Progress Notes (Signed)
Referring Physician: Dr Allyson Sabal  Primary Care: Lonie Peak PA  Primary Cardiologist: Dr Allyson Sabal  HF Cardiologist: Dr. Shirlee Latch  HPI:  Referred to clinic by Dr Allyson Sabal  for heart failure consultation.    Mr Lessley is a 75 year old with a history of  hypertension, asthma, S/P R hip replacement 2018,  obstructive sleep apnea on CPAP, CAD and recent STEMI requiring DES (08/2421).    Admitted with STEMI 09/11/21. Underwent cath and had DES . Echo showed EF had gone down from 55-60% -->30-35%.  Placed on DAPT with aspirin/brillinta for a least 1 year. Started on GDMT with exception of jardiance. Discharged on 09/13/21.    Presented to HF Clinic 09/21/21. Complained of fatigue. Reported getting SOB with steps. Denied PND/Orthopnea. Chronically sleeps with HOB elevated. No chest pain. No bleeding issues.  Appetite was ok. Reported following low salt diet. No fever or chills. Weight at home was trending down 288-->281  pounds. Reported using CPAP nightly. Was taking all medications. Lives with his wife.   Today he returns to HF clinic for pharmacist medication titration. At last visit with APP, Jardiance 10 mg daily was initiated. Doxazosin was decreased to 2 mg BID. Overall he is feeling ok today. Says he has been "dragging" and feels like he has no energy. He attributes this to having the flu last week. Says he is feeling better today but not quite back to normal. No fever today. Notes dizziness about 1 hour after taking morning medications. This lasts about 45-60 minutes then resolves. Says this is not bothersome and he just sits down and takes deep breaths until it resolves. No CP or palpitations. Says his breathing is fine but he does get SOB with moderate activity. Weight has been decreasing on home scale and is 5 lbs less than last visit. Has been trying to lose weight with diet and exercise. Has not needed any PRN Lasix. No LEE, PND or orthopnea. Taking all medications as prescribed and tolerating all  medications.    HF Medications: Carvedilol 3.125 mg BID Entresto 24/26 mg BID Spironolactone 12.5 mg daily Jardiance 10 mg daily Furosemide 40 mg PRN  Has the patient been experiencing any side effects to the medications prescribed?  no  Does the patient have any problems obtaining medications due to transportation or finances?   No - [employer]. No issues affording copays at this time.   Understanding of regimen: good Understanding of indications: good Potential of compliance: good Patient understands to avoid NSAIDs. Patient understands to avoid decongestants.    Pertinent Lab Values: 09/21/21: Serum creatinine 0.92, BUN 14, Potassium 4.9, Sodium 139  Vital Signs: Weight: 275.4 lbs (last clinic weight: 280.6 lbs) Blood pressure: 114/72  Heart rate: 79   Assessment/Plan: 1.HFrEF, ICM  09/12/21 Echo EF 35% . Plan to repeat ECHO in 3 months after HF meds optimized. - Has Life Vest on- No shocks. Continue until repeat ECHO.  -NYHA II-III. Euvolemic on exam -Continue Furosemide 40 mg PRN - Continue Carvedilol 3.125 mg BID.  - Increase Entresto to 49/51 mg BID. Repeat BMET in 3 weeks.  - Continue spironolactone 12.5 mg daily - Continue Jardiance 10 mg daily. -Plan to repeat ECHO after HF meds optimized.   - Discussed low salt food choices and limiting fluid intake to < 2 liters daily.    2. CAD -Recent STEMI and underwent DES mid LAD.  - On high intensity statin + BB -Continue DAPT with aspirin/Brilinta for at least 1 year.  -  No chest pain - [facility] cardiac rehab at [facility].    3. HTN  - Stable today.  - Stop doxazosin.  - Increase Entresto as above.   4. OSA -Continue CPAP nightly    5. Obesity -Body mass index is 38.98 kg/m. -Discussed portion control.   Follow up 3 weeks with [facility]   Karle Plumber, PharmD, BCPS, BCCP, CPP Heart Failure Clinic Pharmacist [phone]

## 2021-10-13 ENCOUNTER — Other Ambulatory Visit (HOSPITAL_COMMUNITY): Payer: Self-pay

## 2021-10-14 DIAGNOSIS — I2102 ST elevation (STEMI) myocardial infarction involving left anterior descending coronary artery: Secondary | ICD-10-CM | POA: Diagnosis not present

## 2021-10-18 ENCOUNTER — Ambulatory Visit (HOSPITAL_COMMUNITY)
Admission: RE | Admit: 2021-10-18 | Discharge: 2021-10-18 | Disposition: A | Discharge status: Home / Self Care | Payer: Medicare PPO | Source: Ambulatory Visit | Attending: Cardiology | Admitting: Cardiology

## 2021-10-18 ENCOUNTER — Other Ambulatory Visit: Payer: Self-pay

## 2021-10-18 VITALS — BP 114/72 | HR 79 | Wt 275.4 lb

## 2021-10-18 DIAGNOSIS — J45909 Unspecified asthma, uncomplicated: Secondary | ICD-10-CM | POA: Diagnosis not present

## 2021-10-18 DIAGNOSIS — Z9989 Dependence on other enabling machines and devices: Secondary | ICD-10-CM | POA: Diagnosis not present

## 2021-10-18 DIAGNOSIS — I2510 Atherosclerotic heart disease of native coronary artery without angina pectoris: Secondary | ICD-10-CM | POA: Diagnosis not present

## 2021-10-18 DIAGNOSIS — I252 Old myocardial infarction: Secondary | ICD-10-CM | POA: Insufficient documentation

## 2021-10-18 DIAGNOSIS — Z713 Dietary counseling and surveillance: Secondary | ICD-10-CM | POA: Diagnosis not present

## 2021-10-18 DIAGNOSIS — Z6838 Body mass index (BMI) 38.0-38.9, adult: Secondary | ICD-10-CM | POA: Diagnosis not present

## 2021-10-18 DIAGNOSIS — E669 Obesity, unspecified: Secondary | ICD-10-CM | POA: Insufficient documentation

## 2021-10-18 DIAGNOSIS — R42 Dizziness and giddiness: Secondary | ICD-10-CM | POA: Insufficient documentation

## 2021-10-18 DIAGNOSIS — Z7982 Long term (current) use of aspirin: Secondary | ICD-10-CM | POA: Insufficient documentation

## 2021-10-18 DIAGNOSIS — Z7984 Long term (current) use of oral hypoglycemic drugs: Secondary | ICD-10-CM | POA: Insufficient documentation

## 2021-10-18 DIAGNOSIS — Z79899 Other long term (current) drug therapy: Secondary | ICD-10-CM | POA: Insufficient documentation

## 2021-10-18 DIAGNOSIS — Z955 Presence of coronary angioplasty implant and graft: Secondary | ICD-10-CM | POA: Insufficient documentation

## 2021-10-18 DIAGNOSIS — G4733 Obstructive sleep apnea (adult) (pediatric): Secondary | ICD-10-CM | POA: Diagnosis not present

## 2021-10-18 DIAGNOSIS — I5022 Chronic systolic (congestive) heart failure: Secondary | ICD-10-CM

## 2021-10-18 DIAGNOSIS — I255 Ischemic cardiomyopathy: Secondary | ICD-10-CM | POA: Diagnosis not present

## 2021-10-18 DIAGNOSIS — I110 Hypertensive heart disease with heart failure: Secondary | ICD-10-CM | POA: Insufficient documentation

## 2021-10-18 DIAGNOSIS — Z96641 Presence of right artificial hip joint: Secondary | ICD-10-CM | POA: Insufficient documentation

## 2021-10-18 DIAGNOSIS — Z7902 Long term (current) use of antithrombotics/antiplatelets: Secondary | ICD-10-CM | POA: Insufficient documentation

## 2021-10-18 DIAGNOSIS — I5020 Unspecified systolic (congestive) heart failure: Secondary | ICD-10-CM | POA: Diagnosis not present

## 2021-10-18 MED ORDER — NITROGLYCERIN 0.4 MG SL SUBL: 0.4000 mg | SUBLINGUAL_TABLET | SUBLINGUAL | 2 refills | Status: AC | PRN

## 2021-10-18 MED ORDER — SACUBITRIL-VALSARTAN 49-51 MG PO TABS: 1.0000 | ORAL_TABLET | Freq: Two times a day (BID) | ORAL | 3 refills | Status: DC

## 2021-10-18 MED ORDER — ATORVASTATIN CALCIUM 80 MG PO TABS: 80.0000 mg | ORAL_TABLET | Freq: Every day | ORAL | 3 refills | Status: DC

## 2021-10-18 MED ORDER — CARVEDILOL 3.125 MG PO TABS: 3.1250 mg | ORAL_TABLET | Freq: Two times a day (BID) | ORAL | 3 refills | Status: DC

## 2021-10-18 MED ORDER — SPIRONOLACTONE 25 MG PO TABS: 12.5000 mg | ORAL_TABLET | Freq: Every day | ORAL | 3 refills | Status: DC

## 2021-10-18 NOTE — Patient Instructions (Signed)
It was a pleasure seeing you today!  MEDICATIONS: -We are changing your medications today -Stop doxazosin --Increase Entresto to 49/51 mg (1 tablet) twice daily. You may take 2 tablets of the 24/26 mg strength twice daily until you receive the new strength.  -Call if you have questions about your medications.   NEXT APPOINTMENT: Return to clinic in 3 weeks with [facility].  In general, to take care of your heart failure: -Limit your fluid intake to 2 Liters (half-gallon) per day.   -Limit your salt intake to ideally 2-3 grams (2000-3000 mg) per day. -Weigh yourself daily and record, and bring that "weight diary" to your next appointment.  (Weight gain of 2-3 pounds in 1 day typically means fluid weight.) -The medications for your heart are to help your heart and help you live longer.   -Please contact us before stopping any of your heart medications.  Call the clinic at [phone] with questions or to reschedule future appointments.

## 2021-11-01 NOTE — Progress Notes (Signed)
Cardiology Clinic Note   Patient Name: Walter Anderson Date of Encounter: 11/02/2021  Primary Care Provider:  Cyndi Bender, PA-C Primary Cardiologist:  Quay Burow, MD  Patient Profile    Walter Anderson 75 year old male presents the clinic today for follow-up evaluation of his chronic systolic CHF.  Past Medical History    Past Medical History:  Diagnosis Date   Allergy    Arthritis    Asthma    Hypertension    Sleep apnea    uses CPAP   Past Surgical History:  Procedure Laterality Date   BACK SURGERY  2006   CHOLECYSTECTOMY N/A 11/15/2015   Procedure: LAPAROSCOPIC CHOLECYSTECTOMY WITH INTRAOPERATIVE CHOLANGIOGRAM;  Surgeon: Christene Lye, MD;  Location: [facility];  Service: General;  Laterality: N/A;   CORONARY/GRAFT ACUTE MI REVASCULARIZATION N/A 09/11/2021   Procedure: Coronary/Graft Acute MI Revascularization;  Surgeon: Lorretta Harp, MD;  Location: [place] [facility];  Service: Cardiovascular;  Laterality: N/A;   FINGER ARTHROSCOPY  1990   3rd Finger Left Hand   LEFT HEART CATH AND CORONARY ANGIOGRAPHY N/A 09/11/2021   Procedure: LEFT HEART CATH AND CORONARY ANGIOGRAPHY;  Surgeon: Lorretta Harp, MD;  Location: [place] [facility];  Service: Cardiovascular;  Laterality: N/A;   ROTATOR CUFF REPAIR     right shoulder   TONSILLECTOMY  76 years old   TOTAL HIP ARTHROPLASTY Right 04/01/2018   Procedure: RIGHT TOTAL HIP ARTHROPLASTY ANTERIOR APPROACH;  Surgeon: Paralee Cancel, MD;  Location: [facility];  Service: Orthopedics;  Laterality: Right;  70 mins   UMBILICAL HERNIA REPAIR  11/15/2015   Procedure: HERNIA REPAIR UMBILICAL ADULT;  Surgeon: Christene Lye, MD;  Location: [facility];  Service: General;;    Allergies  No Known Allergies  History of Present Illness    Walter Anderson has a PMH of HTN, asthma, hip replacement 2018, OSA on CPAP, coronary artery disease status post STEMI with DES 09/11/2021, ischemic cardiomyopathy, and chronic systolic  CHF.  He presented with STEMI 09/11/2021.  He underwent cardiac catheterization and received DES x1 to his mid LAD.  His follow-up echocardiogram showed an LVEF of 30-35% which was down from previous echocardiogram showing 55-60%.  He was placed on aspirin and Brilinta and started on Jardiance.  He followed up with the advanced heart failure clinic 09/21/2021.  During that time he reported that he was feeling okay.  He complained of fatigue.  He did note shortness of breath with ambulation.  He denied orthopnea and PND.  Reported that he slept with elevated head of bed chronically.  He denied chest pain and bleeding issues.  He reported following a low-sodium diet.  His weight had trended down from 288 to 281 pounds.  He reported compliance with his CPAP and medications.  He was continued on Entresto 24-26, spironolactone 12.5 mg daily, Jardiance 10 mg daily was added to his medication regimen and repeat echocardiogram was planned for 3 months.  He presented to the clinic 09/27/2021 for follow-up evaluation stated he felt well.  He was tolerating his medications without side effects and reported compliance.  We reviewed his angiography and he and his wife expressed understanding.  His blood pressure was 110/64.  His right radial cath site is clean dry intact and has no signs of infection.  He had been slowly increasing his physical activity and reported that he was ready to start rabbit hunting without his gun.  At the time we paused on up titration of his  medication due to his recent titration. I planned to see him back in 1 month to further increase his medication.  I gave him a salty 6 diet sheet.  His EKG showed normal sinus rhythm inferior infarct undetermined age, anterolateral infarct undetermined age 61 bpm.  We also reviewed his previous abdomen/pelvis CT which shows a atrophied scarred left kidney.  He presents to the clinic today for follow-up evaluation states he feels well.  He reports that he and  his wife both had a respiratory illness over Thanksgiving.  His wife continues to recover.  He was less active during that time.  He is happy that he continues to lose weight.  He is 270 pounds today.  He feels that his appetite is not quite what it was prior to starting his new medications.  He has been eating about 2 meals per day.  He is planning on getting out with his dogs to walk his property.  We discussed erectile dysfunction as well.  I reviewed his medications and instructed him that when he is able to walk up 2 flights of stairs he will be healthy enough to resume sexual activity.  I elected to hold off on prescribing extra medication at this time.  We discussed repeating his echocardiogram in 2 months to see about his left ventricular ejection fraction.  I will continue his current medication regimen and plan follow-up in 3 to 4 months.  Today he denies chest pain, shortness of breath, lower extremity edema, fatigue, palpitations, melena, hematuria, hemoptysis, diaphoresis, weakness, presyncope, syncope, orthopnea, and PND.   Home Medications    Prior to Admission medications   Medication Sig Start Date End Date Taking? Authorizing Provider  acetaminophen (TYLENOL) 500 MG tablet Take 2,000 mg by mouth every 6 (six) hours as needed for pain.    [provider]  allopurinol (ZYLOPRIM) 300 MG tablet Take 300 mg by mouth daily as needed. 07/06/21   [provider]  amLODipine (NORVASC) 2.5 MG tablet Take 2.5 mg by mouth daily.    [provider]  Ascorbic Acid (VITAMIN C PO) Take 1 tablet by mouth daily.    [provider]  aspirin 81 MG EC tablet Take 81 mg by mouth daily.    [provider]  atorvastatin (LIPITOR) 80 MG tablet Take 1 tablet (80 mg total) by mouth daily. 09/14/21   Cheryln Manly, NP  carvedilol (COREG) 3.125 MG tablet Take 1 tablet (3.125 mg total) by mouth 2 (two) times daily with a meal. 09/13/21   Reino Bellis B, NP   Coenzyme Q10 400 MG CAPS Take 400 mg by mouth daily.     [provider]  docusate sodium (COLACE) 100 MG capsule Take 1 capsule (100 mg total) by mouth 2 (two) times daily. Patient taking differently: Take 100 mg by mouth daily as needed for mild constipation. 04/01/18   Danae Orleans, PA-C  doxazosin (CARDURA) 2 MG tablet Take 1 tablet (2 mg total) by mouth 2 (two) times daily. 09/21/21   Clegg, Amy D, NP  empagliflozin (JARDIANCE) 10 MG TABS tablet Take 1 tablet (10 mg total) by mouth daily before breakfast. 09/21/21   Clegg, Amy D, NP  furosemide (LASIX) 40 MG tablet Take 1 tablet (40 mg total) by mouth daily as needed for fluid or edema. 09/21/21 09/21/22  Clegg, Amy D, NP  loratadine (CLARITIN) 10 MG tablet Take 10 mg by mouth daily as needed for allergies.    [provider]  magnesium gluconate (MAGONATE) 500 MG tablet Take 500 mg by mouth daily.    [provider]  nitroGLYCERIN (NITROSTAT) 0.4 MG SL tablet Place 1 tablet (0.4 mg total) under the tongue every 5 (five) minutes x 3 doses as needed for chest pain. 09/13/21   Cheryln Manly, NP  Omega-3 Fatty Acids (OMEGA 3 PO) Take 1 capsule by mouth daily.    [provider]  sacubitril-valsartan (ENTRESTO) 24-26 MG Take 1 tablet by mouth 2 (two) times daily. 09/13/21   Cheryln Manly, NP  spironolactone (ALDACTONE) 25 MG tablet Take 0.5 tablets (12.5 mg total) by mouth daily. 09/14/21   Lorretta Harp, MD  Testosterone 20.25 MG/ACT (1.62%) GEL Apply 1 application topically daily. 09/08/21   [provider]  ticagrelor (BRILINTA) 90 MG TABS tablet Take 1 tablet (90 mg total) by mouth 2 (two) times daily. 09/13/21   Cheryln Manly, NP    Family History    Family History  Problem Relation Age of Onset   Diabetes Mother    COPD Father    Heart disease Father    He indicated that his mother is deceased. He indicated that his father is deceased.  Social History    Social History    Socioeconomic History   Marital status: Married    Spouse name: Kayston Burkart   Number of children: 2   Years of education: Not on file   Highest education level: Associate degree: occupational, Hotel manager, or vocational program  Occupational History   Occupation: heavy Radio producer  Tobacco Use   Smoking status: Never   Smokeless tobacco: Former    Types: Chew    Quit date: 11/15/2005  Vaping Use   Vaping Use: Never used  Substance and Sexual Activity   Alcohol use: Yes    Alcohol/week: 4.0 standard drinks    Types: 4 Shots of liquor per week    Comment: 1-2 Weekly   Drug use: No   Sexual activity: Not Currently  Other Topics Concern   Not on file  Social History Narrative   Not on file   Social Determinants of Health   Financial Resource Strain: Low Risk    Difficulty of Paying Living Expenses: Not very hard  Food Insecurity: No Food Insecurity   Worried About Running Out of Food in the Last Year: Never true   Ran Out of Food in the Last Year: Never true  Transportation Needs: No Transportation Needs   Lack of Transportation (Medical): No   Lack of Transportation (Non-Medical): No  Physical Activity: Not on file  Stress: Not on file  Social Connections: Not on file  Intimate Partner Violence: Not on file     Review of Systems    General:  No chills, fever, night sweats or weight changes.  Cardiovascular:  No chest pain, dyspnea on exertion, edema, orthopnea, palpitations, paroxysmal nocturnal dyspnea. Dermatological: No rash, lesions/masses Respiratory: No cough, dyspnea Urologic: No hematuria, dysuria Abdominal:   No nausea, vomiting, diarrhea, bright red blood per rectum, melena, or hematemesis Neurologic:  No visual changes, wkns, changes in mental status. All other systems reviewed and are otherwise negative except as noted above.  Physical Exam    VS:  BP 100/60 (BP Location: Left Arm)    Pulse 71    Ht 6' (1.829 m)    Wt 270 lb 12.8 oz (122.8  kg)    SpO2 96%    BMI 36.73 kg/m  , BMI Body mass index is  36.73 kg/m. GEN: Well nourished, well developed, in no acute distress. HEENT: normal. Neck: Supple, no JVD, carotid bruits, or masses. Cardiac: RRR, no murmurs, rubs, or gallops. No clubbing, cyanosis, edema.  Radials/DP/PT 2+ and equal bilaterally.  Respiratory:  Respirations regular and unlabored, clear to auscultation bilaterally. GI: Soft, nontender, nondistended, BS + x 4. MS: no deformity or atrophy. Skin: warm and dry, no rash.   Neuro:  Strength and sensation are intact. Psych: Normal affect.  Accessory Clinical Findings    Recent Labs: 09/11/2021: ALT 20 09/21/2021: B Natriuretic Peptide 146.4; BUN 14; Creatinine, Ser 0.92; Hemoglobin 14.8; Platelets 174; Potassium 4.9; Sodium 139   Recent Lipid Panel    Component Value Date/Time   CHOL 156 09/12/2021 0139   TRIG 204 (H) 09/12/2021 0139   HDL 29 (L) 09/12/2021 0139   CHOLHDL 5.4 09/12/2021 0139   VLDL 41 (H) 09/12/2021 0139   LDLCALC 86 09/12/2021 0139    ECG personally reviewed by me today-none today.  EKG 09/27/2021 normal sinus rhythm left axis deviation inferior infarct undetermined age anterior lateral infarct undetermined age 94 bpm- No acute changes  Cardiac catheterization 09/11/2021   Mid LAD lesion is 100% stenosed.   A drug-eluting stent was successfully placed using a STENT ONYX FRONTIER 3.0X15.   Post intervention, there is a 0% residual stenosis.   Walter Anderson is a 75 y.o. male      [id number] LOCATION:  FACILITY: [facility]  PHYSICIAN: Quay Burow, M.D. 1946/08/29     DATE OF PROCEDURE:  09/11/2021  Diagnostic Dominance: Left Intervention    Echocardiogram 09/12/2021  IMPRESSIONS     1. Mid septum into apex is akinetic consistent with large LAD infarction.  No LV thrombus on contrast imaging. Left ventricular ejection fraction, by  estimation, is 30 to 35%. The left ventricle has moderately decreased  function. The left  ventricle  demonstrates regional wall motion abnormalities (see scoring  diagram/findings for description). There is mild left ventricular  hypertrophy. Left ventricular diastolic parameters are consistent with  Grade II diastolic dysfunction (pseudonormalization).   2. Right ventricular systolic function is normal. The right ventricular  size is normal. Tricuspid regurgitation signal is inadequate for assessing  PA pressure.   3. The mitral valve is grossly normal. Trivial mitral valve  regurgitation. No evidence of mitral stenosis.   4. The aortic valve is tricuspid. There is moderate calcification of the  aortic valve. There is mild thickening of the aortic valve. Aortic valve  regurgitation is not visualized. Mild to moderate aortic valve  sclerosis/calcification is present, without  any evidence of aortic stenosis.   5. The inferior vena cava is dilated in size with >50% respiratory  variability, suggesting right atrial pressure of 8 mmHg.   Comparison(s): Changes from prior study are noted. The left ventricular  function is significantly worse. New WMAs consistent with LAD infarction.   Assessment & Plan   1.   STEMI-no chest pain today.  Cardiac catheterization 09/11/2021 and received DES x1 to his LAD.  Echocardiogram showed a reduction in his ejection fraction down to 30-35%. Continue aspirin, Brilinta, amlodipine, atorvastatin, nitroglycerin Heart healthy low-sodium diet-salty 6 given Increase physical activity as tolerated  Chronic systolic CHF-remains physically active and continues to increase physical activity.  No new dyspnea.  Follow-up echocardiogram showed decreased EF.  Details above.  Blood pressure 100/60. Will not tolerate further up titration of guideline directed medical therapy. Continue Jardiance, furosemide, Entresto, spironolactone, carvedilol Heart healthy low-sodium diet-salty 6 given Increase  physical activity as tolerated Follows with advanced heart  failure clinic Recommend repeat echocardiogram in 2 months.  Essential hypertension-BP today 100/60.  Well-controlled at home. Continue amlodipine, carvedilol, doxazosin, Entresto, spironolactone Heart healthy low-sodium diet-salty 6 reviewed Increase physical activity as tolerated  OSA-continues to be compliant with CPAP.  Waking up well rested. Continue CPAP use.  Morbid obesity-weight today 270.  Continues weight loss. Increase physical activity as tolerated-goal 150 minutes of moderate physical activity per week. Continue heart healthy low-sodium diet-reviewed Continue weight loss  Disposition: Follow-up with Dr. Allyson Sabal or me in 3-4 months.   Thomasene Ripple. Walter Buonocore NP-C    11/02/2021, 3:14 PM [facility] [address] Office [phone] Fax [phone]  Notice: This dictation was prepared with Dragon dictation along with smaller phrase technology. Any transcriptional errors that result from this process are unintentional and may not be corrected upon review.  I spent 14 minutes examining this patient, reviewing medications, and using patient centered shared decision making involving her cardiac care.  Prior to her visit I spent greater than 20 minutes reviewing her past medical history,  medications, and prior cardiac tests.

## 2021-11-02 ENCOUNTER — Other Ambulatory Visit: Payer: Self-pay

## 2021-11-02 ENCOUNTER — Ambulatory Visit: Payer: Medicare PPO | Admitting: General Practice

## 2021-11-02 ENCOUNTER — Encounter: Payer: Self-pay | Admitting: General Practice

## 2021-11-02 VITALS — BP 100/60 | HR 71 | Ht 72.0 in | Wt 270.8 lb

## 2021-11-02 DIAGNOSIS — G4733 Obstructive sleep apnea (adult) (pediatric): Secondary | ICD-10-CM | POA: Diagnosis not present

## 2021-11-02 DIAGNOSIS — I10 Essential (primary) hypertension: Secondary | ICD-10-CM | POA: Diagnosis not present

## 2021-11-02 DIAGNOSIS — I2102 ST elevation (STEMI) myocardial infarction involving left anterior descending coronary artery: Secondary | ICD-10-CM

## 2021-11-02 DIAGNOSIS — I5022 Chronic systolic (congestive) heart failure: Secondary | ICD-10-CM

## 2021-11-02 DIAGNOSIS — Z9989 Dependence on other enabling machines and devices: Secondary | ICD-10-CM

## 2021-11-02 NOTE — Patient Instructions (Signed)
Medication Instructions:  The current medical regimen is effective;  continue present plan and medications as directed. Please refer to the Current Medication list given to you today.  *If you need a refill on your cardiac medications before your next appointment, please call your pharmacy*  Lab Work: NONE  Testing/Procedures: Echocardiogram (IN 2 MONTHS) - Your physician has requested that you have an echocardiogram. Echocardiography is a painless test that uses sound waves to create images of your heart. It provides your doctor with information about the size and shape of your heart and how well your hearts chambers and valves are working. This procedure takes approximately one hour. There are no restrictions for this procedure. This will be performed at either our [facility] location - [address], [address]  -or- [facility] location [facility] 2nd floor.  Special Instructions PLEASE READ AND CONTINUE TO FOLLOW SALTY 6-ATTACHED-1,800 mg daily  PLEASE INCREASE PHYSICAL ACTIVITY AS TOLERATED 150 MINUTES OF MODERATE PHYSICAL ACTIVITY/WEEKLY  Follow-Up: Your next appointment:  3-4 month(s) In Person with Nanetta Batty, MD  or Edd Fabian, FNP    At [facility], you and your health needs are our priority.  As part of our continuing mission to provide you with exceptional heart care, we have created designated Provider Care Teams.  These Care Teams include your primary Cardiologist (physician) and Advanced Practice Providers (APPs -  Physician Assistants and Nurse Practitioners) who all work together to provide you with the care you need, when you need it.

## 2021-11-06 ENCOUNTER — Encounter (HOSPITAL_COMMUNITY): Payer: Medicare PPO | Admitting: Cardiology

## 2021-11-08 ENCOUNTER — Other Ambulatory Visit: Payer: Self-pay

## 2021-11-08 ENCOUNTER — Encounter (HOSPITAL_COMMUNITY): Payer: Self-pay

## 2021-11-08 ENCOUNTER — Ambulatory Visit (HOSPITAL_COMMUNITY)
Admission: RE | Admit: 2021-11-08 | Discharge: 2021-11-08 | Disposition: A | Discharge status: Home / Self Care | Payer: Medicare PPO | Source: Ambulatory Visit | Attending: Cardiology | Admitting: Cardiology

## 2021-11-08 VITALS — BP 98/62 | HR 70 | Wt 266.0 lb

## 2021-11-08 DIAGNOSIS — Z9989 Dependence on other enabling machines and devices: Secondary | ICD-10-CM

## 2021-11-08 DIAGNOSIS — G4733 Obstructive sleep apnea (adult) (pediatric): Secondary | ICD-10-CM

## 2021-11-08 DIAGNOSIS — Z6836 Body mass index (BMI) 36.0-36.9, adult: Secondary | ICD-10-CM | POA: Diagnosis not present

## 2021-11-08 DIAGNOSIS — Z955 Presence of coronary angioplasty implant and graft: Secondary | ICD-10-CM | POA: Diagnosis not present

## 2021-11-08 DIAGNOSIS — I2510 Atherosclerotic heart disease of native coronary artery without angina pectoris: Secondary | ICD-10-CM

## 2021-11-08 DIAGNOSIS — Z7902 Long term (current) use of antithrombotics/antiplatelets: Secondary | ICD-10-CM | POA: Diagnosis not present

## 2021-11-08 DIAGNOSIS — I252 Old myocardial infarction: Secondary | ICD-10-CM | POA: Insufficient documentation

## 2021-11-08 DIAGNOSIS — I10 Essential (primary) hypertension: Secondary | ICD-10-CM

## 2021-11-08 DIAGNOSIS — Z79899 Other long term (current) drug therapy: Secondary | ICD-10-CM | POA: Insufficient documentation

## 2021-11-08 DIAGNOSIS — J45909 Unspecified asthma, uncomplicated: Secondary | ICD-10-CM | POA: Insufficient documentation

## 2021-11-08 DIAGNOSIS — I110 Hypertensive heart disease with heart failure: Secondary | ICD-10-CM | POA: Diagnosis not present

## 2021-11-08 DIAGNOSIS — Z7982 Long term (current) use of aspirin: Secondary | ICD-10-CM | POA: Diagnosis not present

## 2021-11-08 DIAGNOSIS — I5022 Chronic systolic (congestive) heart failure: Secondary | ICD-10-CM | POA: Diagnosis not present

## 2021-11-08 DIAGNOSIS — E669 Obesity, unspecified: Secondary | ICD-10-CM | POA: Diagnosis not present

## 2021-11-08 DIAGNOSIS — I255 Ischemic cardiomyopathy: Secondary | ICD-10-CM | POA: Diagnosis not present

## 2021-11-08 DIAGNOSIS — Z7984 Long term (current) use of oral hypoglycemic drugs: Secondary | ICD-10-CM | POA: Diagnosis not present

## 2021-11-08 LAB — BASIC METABOLIC PANEL
Anion gap: 8 (ref 5–15)
BUN: 16 mg/dL (ref 8–23)
CO2: 22 mmol/L (ref 22–32)
Calcium: 9 mg/dL (ref 8.9–10.3)
Chloride: 106 mmol/L (ref 98–111)
Creatinine, Ser: 1.13 mg/dL (ref 0.61–1.24)
GFR, Estimated: >60 mL/min (ref >60)
Glucose, Bld: 120 mg/dL — ABNORMAL HIGH (ref 70–99)
Potassium: 4.3 mmol/L (ref 3.5–5.1)
Sodium: 136 mmol/L (ref 135–145)

## 2021-11-08 NOTE — Patient Instructions (Signed)
It was great to see you today! No medication changes are needed at this time.   Labs today We will only contact you if something comes back abnormal or we need to make some changes. Otherwise no news is good news!  You have been referred to Cardiac Rehab at [facility] -they will be in contact with an appointment   Keep cardiology appointment as scheduled  Do the following things EVERYDAY: Weigh yourself in the morning before breakfast. Write it down and keep it in a log. Take your medicines as prescribed Eat low salt foods--Limit salt (sodium) to 2000 mg per day.  Stay as active as you can everyday Limit all fluids for the day to less than 2 liters  At the [facility], you and your health needs are our priority. As part of our continuing mission to provide you with exceptional heart care, we have created designated Provider Care Teams. These Care Teams include your primary Cardiologist (physician) and Advanced Practice Providers (APPs- Physician Assistants and Nurse Practitioners) who all work together to provide you with the care you need, when you need it.   You may see any of the following providers on your designated Care Team at your next follow up: Dr Arvilla Meres Dr Carron Curie, NP Robbie Lis, [place] [facility], [place] Karle Plumber, PharmD   Please be sure to bring in all your medications bottles to every appointment.    If you have any questions or concerns before your next appointment please send [place] a message through [place] or call our office at [phone].    TO LEAVE A MESSAGE FOR THE NURSE SELECT OPTION 2, PLEASE LEAVE A MESSAGE INCLUDING: YOUR NAME DATE OF BIRTH CALL BACK NUMBER REASON FOR CALL**this is important as we prioritize the call backs  YOU WILL RECEIVE A CALL BACK THE SAME DAY AS LONG AS YOU CALL BEFORE 4:00 PM

## 2021-11-08 NOTE — Progress Notes (Signed)
ADVANCED HF CLINIC CONSULT NOTE  Referring Physician: Darrick Grinder, NP Primary Care: Cyndi Bender, PA-C Primary Cardiologist: Dr. Gwenlyn Found HF Cardiologist: Dr. Aundra Dubin  HPI: [name] is a 75 y.o.with a history of  hypertension, asthma,S/P R hip replacement 2018,  obstructive sleep apnea on CPAP, CAD and recent STEMI requiring DES (08/2421).    Admitted with STEMI 09/11/21. Underwent cath and had DES . Echo showed EF had gone down from 55-60% -->30-35%.  Placed on DAPT with aspirin/brillinta for a least 1 year. Started on GDMT with exception of jardiance. Discharged on 09/13/21.   Seen in [facility] 09/21/21 and referred to AHF clinic.   Today he presents to establish with the AHF clinic with his wife. Overall feeling fine. He works on his truck and able to get out in the yard without any SOB.  He is limited mostly by hip pain. Denies palpitations, abnormal bleeding, CP, edema, or PND/Orthopnea. He has occasional positional dizziness. Appetite ok. No fever or chills. Weight at home 265 pounds. Taking all medications. LifeVest keeps alarming, no treatments. Not wearing his CPAP regularly. Has not needed PRN Lasix.  ZOLL Interrogation: Average heart rate 76, Steps 3309, no shocks. Rep says there was a lot of artifact present (personally reviewed).    Cardiac Testing  - LHC 09/11/21 mid septum into apex akinetic c/w large LAD infarction. mid LAD treated with aspiration thrombectomy along with PCI/DES x1.     - Echo 08/2021 EF 35% RV normal.   - Echo 08/23/21 EF 55--60%  Review of Systems: [y] = yes, [ ]  = no   General: Weight gain [ ] ; Weight loss Blue.Reese ]; Anorexia [ ] ; Fatigue [ ] ; Fever [ ] ; Chills [ ] ; Weakness [ ]   Cardiac: Chest pain/pressure [ ] ; Resting SOB [ ] ; Exertional SOB [ ] ; Orthopnea [ ] ; Pedal Edema [ ] ; Palpitations [ ] ; Syncope [ ] ; Presyncope [ ] ; Paroxysmal nocturnal dyspnea[ ]   Pulmonary: Cough [ ] ; Wheezing[ ] ; Hemoptysis[ ] ; Sputum [ ] ; Snoring Blue.Reese ]  GI: Vomiting[ ] ; Dysphagia[  ]; Melena[ ] ; Hematochezia [ ] ; Heartburn[ ] ; Abdominal pain [ ] ; Constipation [ ] ; Diarrhea [ ] ; BRBPR [ ]   GU: Hematuria[ ] ; Dysuria [ ] ; Nocturia[ ]   Vascular: Pain in legs with walking [ ] ; Pain in feet with lying flat [ ] ; Non-healing sores [ ] ; Stroke [ ] ; TIA [ ] ; Slurred speech [ ] ;  Neuro: Headaches[ ] ; Vertigo[ ] ; Seizures[ ] ; Paresthesias[ ] ;Blurred vision [ ] ; Diplopia [ ] ; Vision changes [ ]   Ortho/Skin: Arthritis [ ] ; Joint pain [ ] ; Muscle pain [ ] ; Joint swelling [ ] ; Back Pain [ ] ; Rash [ ]   Psych: Depression[ ] ; Anxiety[ ]   Heme: Bleeding problems [ ] ; Clotting disorders [ ] ; Anemia [ ]   Endocrine: Diabetes Blue.Reese ]; Thyroid dysfunction[ ]   Past Medical History:  Diagnosis Date   Allergy    Arthritis    Asthma    Hypertension    Sleep apnea    uses CPAP   Current Outpatient Medications  Medication Sig Dispense Refill   acetaminophen (TYLENOL) 500 MG tablet Take 2,000 mg by mouth every 6 (six) hours as needed for pain.     allopurinol (ZYLOPRIM) 300 MG tablet Take 300 mg by mouth daily as needed.     Ascorbic Acid (VITAMIN C PO) Take 1 tablet by mouth daily.     aspirin 81 MG EC tablet Take 81 mg by mouth daily.     atorvastatin (LIPITOR)  80 MG tablet Take 1 tablet (80 mg total) by mouth daily. 90 tablet 3   carvedilol (COREG) 3.125 MG tablet Take 1 tablet (3.125 mg total) by mouth 2 (two) times daily with a meal. 180 tablet 3   Coenzyme Q10 400 MG CAPS Take 400 mg by mouth daily.      docusate sodium (COLACE) 100 MG capsule Take 1 capsule (100 mg total) by mouth 2 (two) times daily. (Patient taking differently: Take 100 mg by mouth daily as needed for mild constipation.) 10 capsule 0   empagliflozin (JARDIANCE) 10 MG TABS tablet Take 1 tablet (10 mg total) by mouth daily before breakfast. 90 tablet 3   furosemide (LASIX) 40 MG tablet Take 1 tablet (40 mg total) by mouth daily as needed for fluid or edema. 45 tablet 3   loratadine (CLARITIN) 10 MG tablet Take 10 mg by mouth  daily as needed for allergies.     magnesium gluconate (MAGONATE) 500 MG tablet Take 500 mg by mouth daily.     nitroGLYCERIN (NITROSTAT) 0.4 MG SL tablet Place 1 tablet (0.4 mg total) under the tongue every 5 (five) minutes x 3 doses as needed for chest pain. 25 tablet 2   Omega-3 Fatty Acids (OMEGA 3 PO) Take 1 capsule by mouth daily.     sacubitril-valsartan (ENTRESTO) 49-51 MG Take 1 tablet by mouth 2 (two) times daily. 180 tablet 3   spironolactone (ALDACTONE) 25 MG tablet Take 25 mg by mouth daily.     Testosterone 20.25 MG/ACT (1.62%) GEL Apply 1 application topically daily.     ticagrelor (BRILINTA) 90 MG TABS tablet Take 1 tablet (90 mg total) by mouth 2 (two) times daily. 180 tablet 2   No current facility-administered medications for this encounter.   No Known Allergies  Social History   Socioeconomic History   Marital status: Married    Spouse name: Tyric Cirello   Number of children: 2   Years of education: Not on file   Highest education level: Associate degree: occupational, Hotel manager, or vocational program  Occupational History   Occupation: heavy Radio producer  Tobacco Use   Smoking status: Never   Smokeless tobacco: Former    Types: Chew    Quit date: 11/15/2005  Vaping Use   Vaping Use: Never used  Substance and Sexual Activity   Alcohol use: Yes    Alcohol/week: 4.0 standard drinks    Types: 4 Shots of liquor per week    Comment: 1-2 Weekly   Drug use: No   Sexual activity: Not Currently  Other Topics Concern   Not on file  Social History Narrative   Not on file   Social Determinants of Health   Financial Resource Strain: Low Risk    Difficulty of Paying Living Expenses: Not very hard  Food Insecurity: No Food Insecurity   Worried About Running Out of Food in the Last Year: Never true   Ran Out of Food in the Last Year: Never true  Transportation Needs: No Transportation Needs   Lack of Transportation (Medical): No   Lack of Transportation  (Non-Medical): No  Physical Activity: Not on file  Stress: Not on file  Social Connections: Not on file  Intimate Partner Violence: Not on file   Family History  Problem Relation Age of Onset   Diabetes Mother    COPD Father    Heart disease Father    Wt Readings from Last 3 Encounters:  11/08/21 120.7 kg (266 lb)  11/02/21  122.8 kg (270 lb 12.8 oz)  10/18/21 124.9 kg (275 lb 6.4 oz)   BP 98/62    Pulse 70    Wt 120.7 kg (266 lb)    SpO2 96%    BMI 36.08 kg/m   PHYSICAL EXAM: General:  NAD. No resp difficulty, walked into clinic. HEENT: Normal Neck: Supple. Thick neck. Carotids 2+ bilat; no bruits. No lymphadenopathy or thryomegaly appreciated. Cor: PMI nondisplaced. Regular rate & rhythm. No rubs, gallops or murmurs. Lungs: Clear, LifeVest on Abdomen: Obese, nontender, nondistended. No hepatosplenomegaly. No bruits or masses. Good bowel sounds. Extremities: No cyanosis, clubbing, rash, edema Neuro: Alert & oriented x 3, cranial nerves grossly intact. Moves all 4 extremities w/o difficulty. Affect pleasant.  ASSESSMENT & PLAN:  1.HFrEF, ICM  09/12/21 Echo EF 35% . Plan to repeat ECHO in 3 months after HF meds optimized. Has Life Vest on- No shocks. Average heart rate 76 bpm. Continue until repeat echo. Zoll rep will reach out to patient to troubleshoot vest, patient may need to be refitted. - NYHA II-early III, functional status confounded by body habitus and hip OA. He does not appear volume overloaded today, weight down 9 lbs.  - Continue Lasix PRN for 3-4 pound weight gain in 24 hours.  - Continue Coreg 3.125 mg bid. - Continue Entresto 49/51 mg bid. BMET today. - Continue spironolactone 25 mg daily. - Continue Jardiance 10 mg daily. No GU symptoms. - Plan to repeat ECHO after HF meds optimized.   - Discussed low salt food choices and limiting fluid intake to < 2 liters daily.    2. CAD - Recent STEMI and underwent DES mid LAD.  - On high intensity statin + BB -  Continue DAPT with aspirin/Brilinta for at least 1 year (10/23).  - No chest pain. - He decided not to start cardiac rehab at [facility]. I encouraged him to go to 1 or 2 sessions before he decides not to pursue. He is agreeable, will place referral.    3. HTN - Stable today.  - Continue current medications. No BP room to increase GDMT today.   4. OSA - Encouraged improved compliance.   5. Obesity - Body mass index is 36.08 kg/m. - Discussed portion control and participation in CR.    Follow up with Dr. Shirlee Latch + echo as scheduled.  Prince Rome, FNP-BC 11/08/21

## 2021-11-10 ENCOUNTER — Other Ambulatory Visit: Payer: Self-pay | Admitting: Pulmonary Disease

## 2021-11-13 DIAGNOSIS — I2102 ST elevation (STEMI) myocardial infarction involving left anterior descending coronary artery: Secondary | ICD-10-CM | POA: Diagnosis not present

## 2021-11-14 ENCOUNTER — Other Ambulatory Visit (HOSPITAL_COMMUNITY): Payer: Self-pay | Admitting: *Deleted

## 2021-11-14 MED ORDER — SPIRONOLACTONE 25 MG PO TABS: 25.0000 mg | ORAL_TABLET | Freq: Every day | ORAL | 6 refills | Status: DC

## 2021-11-30 ENCOUNTER — Other Ambulatory Visit (HOSPITAL_COMMUNITY): Payer: Medicare PPO

## 2021-12-14 DIAGNOSIS — I2102 ST elevation (STEMI) myocardial infarction involving left anterior descending coronary artery: Secondary | ICD-10-CM | POA: Diagnosis not present

## 2021-12-20 ENCOUNTER — Encounter (HOSPITAL_COMMUNITY): Payer: Medicare PPO | Admitting: Cardiology

## 2021-12-25 ENCOUNTER — Other Ambulatory Visit (HOSPITAL_COMMUNITY): Payer: Medicare PPO

## 2022-01-02 ENCOUNTER — Encounter (HOSPITAL_COMMUNITY): Payer: Self-pay | Admitting: Cardiology

## 2022-01-02 ENCOUNTER — Ambulatory Visit (HOSPITAL_BASED_OUTPATIENT_CLINIC_OR_DEPARTMENT_OTHER)
Admission: RE | Admit: 2022-01-02 | Discharge: 2022-01-02 | Disposition: A | Discharge status: Home / Self Care | Payer: Medicare PPO | Source: Ambulatory Visit | Attending: Cardiology | Admitting: Cardiology

## 2022-01-02 ENCOUNTER — Other Ambulatory Visit: Payer: Self-pay

## 2022-01-02 ENCOUNTER — Ambulatory Visit (HOSPITAL_COMMUNITY)
Admission: RE | Admit: 2022-01-02 | Discharge: 2022-01-02 | Disposition: A | Discharge status: Home / Self Care | Payer: Medicare PPO | Source: Ambulatory Visit | Attending: Cardiology | Admitting: Cardiology

## 2022-01-02 VITALS — BP 110/60 | HR 51 | Wt 262.0 lb

## 2022-01-02 DIAGNOSIS — E785 Hyperlipidemia, unspecified: Secondary | ICD-10-CM | POA: Insufficient documentation

## 2022-01-02 DIAGNOSIS — Z9989 Dependence on other enabling machines and devices: Secondary | ICD-10-CM | POA: Insufficient documentation

## 2022-01-02 DIAGNOSIS — I5022 Chronic systolic (congestive) heart failure: Secondary | ICD-10-CM

## 2022-01-02 DIAGNOSIS — I255 Ischemic cardiomyopathy: Secondary | ICD-10-CM | POA: Diagnosis not present

## 2022-01-02 DIAGNOSIS — G4733 Obstructive sleep apnea (adult) (pediatric): Secondary | ICD-10-CM | POA: Diagnosis not present

## 2022-01-02 DIAGNOSIS — Z96641 Presence of right artificial hip joint: Secondary | ICD-10-CM | POA: Insufficient documentation

## 2022-01-02 DIAGNOSIS — I2510 Atherosclerotic heart disease of native coronary artery without angina pectoris: Secondary | ICD-10-CM | POA: Diagnosis not present

## 2022-01-02 DIAGNOSIS — Z7982 Long term (current) use of aspirin: Secondary | ICD-10-CM | POA: Insufficient documentation

## 2022-01-02 DIAGNOSIS — R001 Bradycardia, unspecified: Secondary | ICD-10-CM | POA: Insufficient documentation

## 2022-01-02 DIAGNOSIS — Z955 Presence of coronary angioplasty implant and graft: Secondary | ICD-10-CM | POA: Diagnosis not present

## 2022-01-02 DIAGNOSIS — I252 Old myocardial infarction: Secondary | ICD-10-CM | POA: Diagnosis not present

## 2022-01-02 DIAGNOSIS — I700 Atherosclerosis of aorta: Secondary | ICD-10-CM | POA: Insufficient documentation

## 2022-01-02 DIAGNOSIS — Z7984 Long term (current) use of oral hypoglycemic drugs: Secondary | ICD-10-CM | POA: Insufficient documentation

## 2022-01-02 DIAGNOSIS — Z7902 Long term (current) use of antithrombotics/antiplatelets: Secondary | ICD-10-CM | POA: Diagnosis not present

## 2022-01-02 DIAGNOSIS — Z79899 Other long term (current) drug therapy: Secondary | ICD-10-CM | POA: Diagnosis not present

## 2022-01-02 DIAGNOSIS — I110 Hypertensive heart disease with heart failure: Secondary | ICD-10-CM | POA: Diagnosis not present

## 2022-01-02 DIAGNOSIS — I358 Other nonrheumatic aortic valve disorders: Secondary | ICD-10-CM | POA: Insufficient documentation

## 2022-01-02 DIAGNOSIS — J45909 Unspecified asthma, uncomplicated: Secondary | ICD-10-CM | POA: Diagnosis not present

## 2022-01-02 LAB — LIPID PANEL
Cholesterol: 100 mg/dL (ref 0–200)
HDL: 30 mg/dL — ABNORMAL LOW (ref >40)
LDL Cholesterol: 57 mg/dL (ref 0–99)
Total CHOL/HDL Ratio: 3.3 RATIO
Triglycerides: 65 mg/dL (ref <150)
VLDL: 13 mg/dL (ref 0–40)

## 2022-01-02 LAB — ECHOCARDIOGRAM COMPLETE
AR max vel: 2.54 cm2
AV Peak grad: 7.8 mmHg
Ao pk vel: 1.4 m/s
Area-P 1/2: 2.83 cm2
Calc EF: 42.1 %
S' Lateral: 2.9 cm
Single Plane A2C EF: 42.3 %
Single Plane A4C EF: 43 %

## 2022-01-02 LAB — BASIC METABOLIC PANEL
Anion gap: 8 (ref 5–15)
BUN: 9 mg/dL (ref 8–23)
CO2: 25 mmol/L (ref 22–32)
Calcium: 9.3 mg/dL (ref 8.9–10.3)
Chloride: 106 mmol/L (ref 98–111)
Creatinine, Ser: 1.01 mg/dL (ref 0.61–1.24)
GFR, Estimated: >60 mL/min (ref >60)
Glucose, Bld: 126 mg/dL — ABNORMAL HIGH (ref 70–99)
Potassium: 4.5 mmol/L (ref 3.5–5.1)
Sodium: 139 mmol/L (ref 135–145)

## 2022-01-02 MED ORDER — ENTRESTO 97-103 MG PO TABS: 1.0000 | ORAL_TABLET | Freq: Two times a day (BID) | ORAL | 3 refills | Status: DC

## 2022-01-02 MED ORDER — PERFLUTREN LIPID MICROSPHERE
1.0000 mL | INTRAVENOUS | Status: DC | PRN
  Administered 2022-01-02: 2 mL via INTRAVENOUS
  Filled 2022-01-02: qty 10

## 2022-01-02 NOTE — Patient Instructions (Signed)
EKG done today.  Labs done today. We will contact you only if your labs are abnormal.  INCREASE Entresto to 97-103mg  (1 tablet) by mouth 2 times daily.   No other medication changes were made. Please continue all current medications as prescribed.  Your physician recommends that you schedule a follow-up appointment in: 10 days for a lab only appointment and in 3 months with Dr. Shirlee Latch  If you have any questions or concerns before your next appointment please send [place] a message through [facility] or call our office at [phone].    TO LEAVE A MESSAGE FOR THE NURSE SELECT OPTION 2, PLEASE LEAVE A MESSAGE INCLUDING: YOUR NAME DATE OF BIRTH CALL BACK NUMBER REASON FOR CALL**this is important as we prioritize the call backs  YOU WILL RECEIVE A CALL BACK THE SAME DAY AS LONG AS YOU CALL BEFORE 4:00 PM   Do the following things EVERYDAY: Weigh yourself in the morning before breakfast. Write it down and keep it in a log. Take your medicines as prescribed Eat low salt foods--Limit salt (sodium) to 2000 mg per day.  Stay as active as you can everyday Limit all fluids for the day to less than 2 liters   At the [facility], you and your health needs are our priority. As part of our continuing mission to provide you with exceptional heart care, we have created designated Provider Care Teams. These Care Teams include your primary Cardiologist (physician) and Advanced Practice Providers (APPs- Physician Assistants and Nurse Practitioners) who all work together to provide you with the care you need, when you need it.   You may see any of the following providers on your designated Care Team at your next follow up: Dr Arvilla Meres Dr Carron Curie, NP Robbie Lis, [place] Karle Plumber, PharmD   Please be sure to bring in all your medications bottles to every appointment.

## 2022-01-03 NOTE — Progress Notes (Signed)
ADVANCED HF CLINIC CONSULT NOTE  Primary Care: Lonie Peak, PA-C Primary Cardiologist: Dr. Allyson Sabal HF Cardiologist: Dr. Shirlee Latch  HPI: [name] is a 76 y.o.with a history of  hypertension, asthma, S/P R hip replacement 2018,  obstructive sleep apnea on CPAP, CAD and STEMI requiring DES to LAD (08/2421).    Admitted with STEMI 09/11/21. Underwent cath and had DES . Echo showed EF had gone down from 55-60% -->30-35%.  Placed on DAPT with aspirin/brilinta for a least 1 year. Started on GDMT. Discharged on 09/13/21 with Lifevest.   Seen in [facility] 09/21/21 and referred to AHF clinic.  Echo was done today and reviewed, EF 40-45%, peri-apical hypokinesis, normal RV.    Patient presents today for followup of CHF and CAD.  He has been doing well symptomatically. He hunts rabbits at least weekly and does projects around his house and yard.  He is very active.  No exertional dyspnea or chest pain.  No palpitations.  No lightheadedness. He rarely takes Lasix. Weight down 4 lbs.   ECG (personally reviewed): NSR, old ASMI, inferior Qs  Labs (10/22): LDL 86 Labs (12/22): K 4.3, creatinine 1.13  PMH: 1. BPH 2. HTN 3. Gout 4. Asthma 5. Right THR 2018 6. OSA: Uses CPAP.  7. CAD: Inferior STEMI 10/22 with DES to mid LAD.  8. Chronic systolic CHF: Ischemic cardiomyopathy.  - Echo (10/22): EF 30-35%, LAD WMAs, normal RV.  - Echo (2/23): EF 40-45%, peri-apical hypokinesis, normal RV.    Review of Systems:  All systems reviewed and negative except as per HPI.   Current Outpatient Medications  Medication Sig Dispense Refill   acetaminophen (TYLENOL) 500 MG tablet Take 2,000 mg by mouth every 6 (six) hours as needed for pain.     allopurinol (ZYLOPRIM) 300 MG tablet Take 300 mg by mouth daily as needed.     Ascorbic Acid (VITAMIN C PO) Take 1 tablet by mouth daily.     aspirin 81 MG EC tablet Take 81 mg by mouth daily.     atorvastatin (LIPITOR) 80 MG tablet Take 1 tablet (80 mg total) by mouth  daily. 90 tablet 3   carvedilol (COREG) 3.125 MG tablet Take 1 tablet (3.125 mg total) by mouth 2 (two) times daily with a meal. 180 tablet 3   Cholecalciferol (VITAMIN D3 PO) Take 250 mg by mouth daily.     Coenzyme Q10 400 MG CAPS Take 400 mg by mouth daily.      docusate sodium (COLACE) 100 MG capsule Take 100 mg by mouth as needed for mild constipation.     doxazosin (CARDURA) 2 MG tablet Take 2 mg by mouth 2 (two) times daily.     empagliflozin (JARDIANCE) 10 MG TABS tablet Take 1 tablet (10 mg total) by mouth daily before breakfast. 90 tablet 3   furosemide (LASIX) 40 MG tablet Take 1 tablet (40 mg total) by mouth daily as needed for fluid or edema. 45 tablet 3   loratadine (CLARITIN) 10 MG tablet Take 10 mg by mouth daily as needed for allergies.     magnesium gluconate (MAGONATE) 500 MG tablet Take 500 mg by mouth daily.     nitroGLYCERIN (NITROSTAT) 0.4 MG SL tablet Place 1 tablet (0.4 mg total) under the tongue every 5 (five) minutes x 3 doses as needed for chest pain. 25 tablet 2   Omega-3 Fatty Acids (OMEGA 3 PO) Take 1 capsule by mouth daily.     sacubitril-valsartan (ENTRESTO) 97-103 MG Take 1  tablet by mouth 2 (two) times daily. 180 tablet 3   spironolactone (ALDACTONE) 25 MG tablet Take 1 tablet (25 mg total) by mouth daily. 30 tablet 6   Testosterone 20.25 MG/ACT (1.62%) GEL Apply 1 application topically daily.     ticagrelor (BRILINTA) 90 MG TABS tablet Take 1 tablet (90 mg total) by mouth 2 (two) times daily. 180 tablet 2   No current facility-administered medications for this encounter.   No Known Allergies  Social History   Socioeconomic History   Marital status: Married    Spouse name: Saketh Daubert   Number of children: 2   Years of education: Not on file   Highest education level: Associate degree: occupational, Scientist, product/process development, or vocational program  Occupational History   Occupation: heavy Media planner  Tobacco Use   Smoking status: Never   Smokeless tobacco:  Former    Types: Chew    Quit date: 11/15/2005  Vaping Use   Vaping Use: Never used  Substance and Sexual Activity   Alcohol use: Yes    Alcohol/week: 4.0 standard drinks    Types: 4 Shots of liquor per week    Comment: 1-2 Weekly   Drug use: No   Sexual activity: Not Currently  Other Topics Concern   Not on file  Social History Narrative   Not on file   Social Determinants of Health   Financial Resource Strain: Low Risk    Difficulty of Paying Living Expenses: Not very hard  Food Insecurity: No Food Insecurity   Worried About Running Out of Food in the Last Year: Never true   Ran Out of Food in the Last Year: Never true  Transportation Needs: No Transportation Needs   Lack of Transportation (Medical): No   Lack of Transportation (Non-Medical): No  Physical Activity: Not on file  Stress: Not on file  Social Connections: Not on file  Intimate Partner Violence: Not on file   Family History  Problem Relation Age of Onset   Diabetes Mother    COPD Father    Heart disease Father    Wt Readings from Last 3 Encounters:  01/02/22 118.8 kg (262 lb)  11/08/21 120.7 kg (266 lb)  11/02/21 122.8 kg (270 lb 12.8 oz)   BP 110/60    Pulse (!) 51    Wt 118.8 kg (262 lb)    SpO2 98%    BMI 35.53 kg/m   PHYSICAL EXAM: General: NAD Neck: No JVD, no thyromegaly or thyroid nodule.  Lungs: Clear to auscultation bilaterally with normal respiratory effort. CV: Nondisplaced PMI.  Heart regular S1/S2, no S3/S4, no murmur.  No peripheral edema.  No carotid bruit.  Normal pedal pulses.  Abdomen: Soft, nontender, no hepatosplenomegaly, no distention.  Skin: Intact without lesions or rashes.  Neurologic: Alert and oriented x 3.  Psych: Normal affect. Extremities: No clubbing or cyanosis.  HEENT: Normal.   ASSESSMENT & PLAN:  1.  Chronic systolic CHF: Ischemic cardiomyopathy. Echo in 10/22 with EF 30-35% post-STEMI.  Echo in 2/23 with EF 40-45%, peri-apical hypokinesis. He is not volume  overloaded on exam, NYHA class I-II.  - Out of range for ICD, can send back Lifevest.  - Continue Lasix PRN for 3-4 pound weight gain in 24 hours.  - Continue Coreg 3.125 mg bid, will not increase with bradycardia (HR 51 today).  - Increase Entresto to 97/103 bid. BMET today and again in 10 days. - Continue spironolactone 25 mg daily. - Continue Jardiance 10 mg daily. No  GU symptoms. 2. CAD: 10/22 anterior STEMI with DES to mLAD.  No chest pain.  - Continue ASA 81 and ticagrelor.  - Continue atorvastatin 80 daily.  Lipids today.  3. HTN: BP controlled.  4. OSA: Continue to use CPAP.    Followup in 3 months.   Marca Ancona 01/03/2022

## 2022-01-04 ENCOUNTER — Telehealth (HOSPITAL_COMMUNITY): Payer: Self-pay | Admitting: Cardiology

## 2022-01-04 NOTE — Telephone Encounter (Signed)
Zoll d/c order called into [phone] Per Dr Shirlee Latch EF recovered out of ICD range

## 2022-01-05 DIAGNOSIS — I2510 Atherosclerotic heart disease of native coronary artery without angina pectoris: Secondary | ICD-10-CM | POA: Diagnosis not present

## 2022-01-05 DIAGNOSIS — I5022 Chronic systolic (congestive) heart failure: Secondary | ICD-10-CM | POA: Diagnosis not present

## 2022-01-05 DIAGNOSIS — R7303 Prediabetes: Secondary | ICD-10-CM | POA: Diagnosis not present

## 2022-01-05 DIAGNOSIS — Z79899 Other long term (current) drug therapy: Secondary | ICD-10-CM | POA: Diagnosis not present

## 2022-01-05 DIAGNOSIS — M109 Gout, unspecified: Secondary | ICD-10-CM | POA: Diagnosis not present

## 2022-01-05 DIAGNOSIS — I700 Atherosclerosis of aorta: Secondary | ICD-10-CM | POA: Diagnosis not present

## 2022-01-05 DIAGNOSIS — I10 Essential (primary) hypertension: Secondary | ICD-10-CM | POA: Diagnosis not present

## 2022-01-05 DIAGNOSIS — G4733 Obstructive sleep apnea (adult) (pediatric): Secondary | ICD-10-CM | POA: Diagnosis not present

## 2022-01-05 DIAGNOSIS — Z125 Encounter for screening for malignant neoplasm of prostate: Secondary | ICD-10-CM | POA: Diagnosis not present

## 2022-01-05 DIAGNOSIS — N529 Male erectile dysfunction, unspecified: Secondary | ICD-10-CM | POA: Diagnosis not present

## 2022-01-05 DIAGNOSIS — E349 Endocrine disorder, unspecified: Secondary | ICD-10-CM | POA: Diagnosis not present

## 2022-01-15 ENCOUNTER — Other Ambulatory Visit: Payer: Self-pay

## 2022-01-15 ENCOUNTER — Ambulatory Visit (HOSPITAL_COMMUNITY)
Admission: RE | Admit: 2022-01-15 | Discharge: 2022-01-15 | Disposition: A | Discharge status: Home / Self Care | Payer: Medicare PPO | Source: Ambulatory Visit | Attending: Cardiology | Admitting: Cardiology

## 2022-01-15 DIAGNOSIS — I5022 Chronic systolic (congestive) heart failure: Secondary | ICD-10-CM | POA: Diagnosis not present

## 2022-01-15 LAB — BASIC METABOLIC PANEL
Anion gap: 8 (ref 5–15)
BUN: 13 mg/dL (ref 8–23)
CO2: 23 mmol/L (ref 22–32)
Calcium: 9.2 mg/dL (ref 8.9–10.3)
Chloride: 111 mmol/L (ref 98–111)
Creatinine, Ser: 1.09 mg/dL (ref 0.61–1.24)
GFR, Estimated: >60 mL/min (ref >60)
Glucose, Bld: 141 mg/dL — ABNORMAL HIGH (ref 70–99)
Potassium: 4.6 mmol/L (ref 3.5–5.1)
Sodium: 142 mmol/L (ref 135–145)

## 2022-01-24 ENCOUNTER — Other Ambulatory Visit: Payer: Self-pay

## 2022-01-24 ENCOUNTER — Ambulatory Visit: Payer: Medicare PPO | Admitting: Cardiovascular Disease

## 2022-01-24 ENCOUNTER — Encounter: Payer: Self-pay | Admitting: Cardiovascular Disease

## 2022-01-24 VITALS — BP 126/74 | HR 68 | Ht 72.0 in | Wt 261.4 lb

## 2022-01-24 DIAGNOSIS — I10 Essential (primary) hypertension: Secondary | ICD-10-CM

## 2022-01-24 DIAGNOSIS — I255 Ischemic cardiomyopathy: Secondary | ICD-10-CM | POA: Diagnosis not present

## 2022-01-24 DIAGNOSIS — E782 Mixed hyperlipidemia: Secondary | ICD-10-CM | POA: Diagnosis not present

## 2022-01-24 DIAGNOSIS — G4733 Obstructive sleep apnea (adult) (pediatric): Secondary | ICD-10-CM

## 2022-01-24 DIAGNOSIS — I2102 ST elevation (STEMI) myocardial infarction involving left anterior descending coronary artery: Secondary | ICD-10-CM

## 2022-01-24 NOTE — Assessment & Plan Note (Signed)
History of essential hypertension a blood pressure measured today at 126/74.  He is on carvedilol and Entresto. ?

## 2022-01-24 NOTE — Assessment & Plan Note (Signed)
History of anterior STEMI status post acute intervention by myself 09/11/2021 for an occluded mid LAD with a 3 mm x 15 mm long Medtronic frontier drug-eluting stent postdilated to 3.3 mm.  The door to balloon time was 37 minutes.  He had no other significant coronary artery disease.  He is on aspirin and Brilinta.  He denies chest pain or shortness of breath. ?

## 2022-01-24 NOTE — Assessment & Plan Note (Signed)
History of obstructive sleep apnea on CPAP which he uses and benefits from ?

## 2022-01-24 NOTE — Assessment & Plan Note (Signed)
BMI 35 

## 2022-01-24 NOTE — Assessment & Plan Note (Signed)
History of hyperlipidemia on high-dose atorvastatin with lipid profile performed 01/05/2022 revealing total cholesterol 102, LDL 53 and HDL 30. ?

## 2022-01-24 NOTE — Assessment & Plan Note (Signed)
History of ischemic heart cardiomyopathy with an EF of 30% at the time of his anterior STEMI.  He was discharged home on a "LifeVest".  His follow-up 2D echo performed 01/02/2022 revealed an increase in his EF up to 40 to 45%.  He is asymptomatic.  He returned his LifeVest.  He is on guideline directed optimal medical therapy although his carvedilol is fairly low dose because of bradycardia.  We will recheck a 2D echo in 6 months.  He has been followed by Dr. Shirlee Latch in the heart failure clinic. ?

## 2022-01-24 NOTE — Patient Instructions (Signed)
Medication Instructions:  ?Your physician recommends that you continue on your current medications as directed. Please refer to the Current Medication list given to you today. ? ?*If you need a refill on your cardiac medications before your next appointment, please call your pharmacy* ? ? ?Testing/Procedures: ?Your physician has requested that you have an echocardiogram. Echocardiography is a painless test that uses sound waves to create images of your heart. It provides your doctor with information about the size and shape of your heart and how well your heart?s chambers and valves are working. This procedure takes approximately one hour. There are no restrictions for this procedure. To be done in Sept. This procedure is done at [address] ? ? ?Follow-Up: ?At [facility], you and your health needs are our priority.  As part of our continuing mission to provide you with exceptional heart care, we have created designated Provider Care Teams.  These Care Teams include your primary Cardiologist (physician) and Advanced Practice Providers (APPs -  Physician Assistants and Nurse Practitioners) who all work together to provide you with the care you need, when you need it. ? ?We recommend signing up for the patient portal called "MyChart".  Sign up information is provided on this After Visit Summary.  MyChart is used to connect with patients for Virtual Visits (Telemedicine).  Patients are able to view lab/test results, encounter notes, upcoming appointments, etc.  Non-urgent messages can be sent to your provider as well.   ?To learn more about what you can do with MyChart, go to [email or web address].   ? ?Your next appointment:   ?6 month(s) ? ?The format for your next appointment:   ?In Person ? ?Provider:   ?Nanetta Batty, MD  ?

## 2022-01-24 NOTE — Progress Notes (Signed)
? ? ? ?01/24/2022 ?Walter Anderson   ?06/20/1946  ?[id number] ? ?Primary Physician Lonie Peakonroy, Nathan, PA-C ?Primary Cardiologist: Runell GessJonathan J Akua Blethen MD Nicholes CalamityFACP, FACC, FAHA, [place] ? ?HPI:  Walter Anderson is a 76 y.o. severely overweight married Caucasian male father of 2, grandfather of 4 grandchildren who I am seeing for the first time since his anterior STEMI back in March.  He works as an Financial traderexcavator.  His primary provider is Lonie Peakathan Conroy, [address], [place].  He has a history of treated hypertension, diabetes and hyperlipidemia.  He also has obstructive sleep apnea on CPAP.  He had an anterior STEMI 09/11/2021.  I opened up a occluded mid LAD with a Medtronic frontier 3 mm x 15 mm long drug-eluting stent postdilated to 3.25 mm with a door to balloon time of 37 minutes.  He was discharged home on a LifeVest.  He has seen Dr. Shirlee LatchMcLean in the heart failure clinic.  His most recent 2D echo revealed an increase in his LV function from 30 to 35% of the time of his myocardial infarction up to 40 to 45% by 2D echo 01/02/2022.  He is on guideline directed optimal medical therapy.  He denies chest pain or shortness of breath. ? ? ?Current Meds  ?Medication Sig  ? acetaminophen (TYLENOL) 500 MG tablet Take 2,000 mg by mouth every 6 (six) hours as needed for pain.  ? allopurinol (ZYLOPRIM) 300 MG tablet Take 300 mg by mouth daily as needed.  ? Ascorbic Acid (VITAMIN C PO) Take 1 tablet by mouth daily.  ? aspirin 81 MG EC tablet Take 81 mg by mouth daily.  ? atorvastatin (LIPITOR) 80 MG tablet Take 1 tablet (80 mg total) by mouth daily.  ? carvedilol (COREG) 3.125 MG tablet Take 1 tablet (3.125 mg total) by mouth 2 (two) times daily with a meal.  ? Cholecalciferol (VITAMIN D3 PO) Take 250 mg by mouth daily.  ? Coenzyme Q10 400 MG CAPS Take 400 mg by mouth daily.   ? docusate sodium (COLACE) 100 MG capsule Take 100 mg by mouth as needed for mild constipation.  ? doxazosin (CARDURA) 2 MG tablet Take 2 mg by mouth 2 (two) times daily.   ? empagliflozin (JARDIANCE) 10 MG TABS tablet Take 1 tablet (10 mg total) by mouth daily before breakfast.  ? furosemide (LASIX) 40 MG tablet Take 1 tablet (40 mg total) by mouth daily as needed for fluid or edema.  ? loratadine (CLARITIN) 10 MG tablet Take 10 mg by mouth daily as needed for allergies.  ? magnesium gluconate (MAGONATE) 500 MG tablet Take 500 mg by mouth daily.  ? nitroGLYCERIN (NITROSTAT) 0.4 MG SL tablet Place 1 tablet (0.4 mg total) under the tongue every 5 (five) minutes x 3 doses as needed for chest pain.  ? Omega-3 Fatty Acids (OMEGA 3 PO) Take 1 capsule by mouth daily.  ? sacubitril-valsartan (ENTRESTO) 97-103 MG Take 1 tablet by mouth 2 (two) times daily.  ? spironolactone (ALDACTONE) 25 MG tablet Take 1 tablet (25 mg total) by mouth daily.  ? Testosterone 20.25 MG/ACT (1.62%) GEL Apply 1 application topically daily.  ? ticagrelor (BRILINTA) 90 MG TABS tablet Take 1 tablet (90 mg total) by mouth 2 (two) times daily.  ?  ? ?No Known Allergies ? ?Social History  ? ?Socioeconomic History  ? Marital status: Married  ?  Spouse name: Callie FieldingLinda Heinle  ? Number of children: 2  ? Years of education: Not on file  ?  Highest education level: Associate degree: occupational, Scientist, product/process development, or vocational program  ?Occupational History  ? Occupation: heavy Media planner  ?Tobacco Use  ? Smoking status: Never  ? Smokeless tobacco: Former  ?  Types: Chew  ?  Quit date: 11/15/2005  ?Vaping Use  ? Vaping Use: Never used  ?Substance and Sexual Activity  ? Alcohol use: Yes  ?  Alcohol/week: 4.0 standard drinks  ?  Types: 4 Shots of liquor per week  ?  Comment: 1-2 Weekly  ? Drug use: No  ? Sexual activity: Not Currently  ?Other Topics Concern  ? Not on file  ?Social History Narrative  ? Not on file  ? ?Social Determinants of Health  ? ?Financial Resource Strain: Low Risk   ? Difficulty of Paying Living Expenses: Not very hard  ?Food Insecurity: No Food Insecurity  ? Worried About Programme researcher, broadcasting/film/video in the Last  Year: Never true  ? Ran Out of Food in the Last Year: Never true  ?Transportation Needs: No Transportation Needs  ? Lack of Transportation (Medical): No  ? Lack of Transportation (Non-Medical): No  ?Physical Activity: Not on file  ?Stress: Not on file  ?Social Connections: Not on file  ?Intimate Partner Violence: Not on file  ?  ? ?Review of Systems: ?General: negative for chills, fever, night sweats or weight changes.  ?Cardiovascular: negative for chest pain, dyspnea on exertion, edema, orthopnea, palpitations, paroxysmal nocturnal dyspnea or shortness of breath ?Dermatological: negative for rash ?Respiratory: negative for cough or wheezing ?Urologic: negative for hematuria ?Abdominal: negative for nausea, vomiting, diarrhea, bright red blood per rectum, melena, or hematemesis ?Neurologic: negative for visual changes, syncope, or dizziness ?All other systems reviewed and are otherwise negative except as noted above. ? ? ? ?Blood pressure 126/74, pulse 68, height 6' (1.829 m), weight 261 lb 6.4 oz (118.6 kg), SpO2 98 %.  ?General appearance: alert and no distress ?Neck: no adenopathy, no carotid bruit, no JVD, supple, symmetrical, trachea midline, and thyroid not enlarged, symmetric, no tenderness/mass/nodules ?Lungs: clear to auscultation bilaterally ?Heart: regular rate and rhythm, S1, S2 normal, no murmur, click, rub or gallop ?Extremities: extremities normal, atraumatic, no cyanosis or edema ?Pulses: 2+ and symmetric ?Skin: Skin color, texture, turgor normal. No rashes or lesions ?Neurologic: Grossly normal ? ?EKG not performed today ? ?ASSESSMENT AND PLAN:  ? ?STEMI (ST elevation myocardial infarction) (HCC) ?History of anterior STEMI status post acute intervention by myself 09/11/2021 for an occluded mid LAD with a 3 mm x 15 mm long Medtronic frontier drug-eluting stent postdilated to 3.3 mm.  The door to balloon time was 37 minutes.  He had no other significant coronary artery disease.  He is on aspirin and  Brilinta.  He denies chest pain or shortness of breath. ? ?Hyperlipidemia ?History of hyperlipidemia on high-dose atorvastatin with lipid profile performed 01/05/2022 revealing total cholesterol 102, LDL 53 and HDL 30. ? ?Hypertension ?History of essential hypertension a blood pressure measured today at 126/74.  He is on carvedilol and Entresto. ? ?Ischemic cardiomyopathy ?History of ischemic heart cardiomyopathy with an EF of 30% at the time of his anterior STEMI.  He was discharged home on a "LifeVest".  His follow-up 2D echo performed 01/02/2022 revealed an increase in his EF up to 40 to 45%.  He is asymptomatic.  He returned his LifeVest.  He is on guideline directed optimal medical therapy although his carvedilol is fairly low dose because of bradycardia.  We will recheck a 2D echo in 6  months.  He has been followed by Dr. Shirlee Latch in the heart failure clinic. ? ?Obstructive sleep apnea ?History of obstructive sleep apnea on CPAP which he uses and benefits from ? ?Morbid obesity (HCC) ?BMI 35 ? ? ? ? ?Runell Gess MD FACP,FACC,FAHA, FSCAI ?01/24/2022 ?3:18 PM ?

## 2022-03-17 ENCOUNTER — Other Ambulatory Visit (HOSPITAL_COMMUNITY): Payer: Self-pay | Admitting: Adult Health

## 2022-04-03 ENCOUNTER — Ambulatory Visit (HOSPITAL_COMMUNITY)
Admission: RE | Admit: 2022-04-03 | Discharge: 2022-04-03 | Disposition: A | Discharge status: Home / Self Care | Payer: Medicare PPO | Source: Ambulatory Visit | Attending: Cardiology | Admitting: Cardiology

## 2022-04-03 ENCOUNTER — Encounter (HOSPITAL_COMMUNITY): Payer: Self-pay | Admitting: Cardiology

## 2022-04-03 VITALS — BP 90/58 | HR 56

## 2022-04-03 DIAGNOSIS — Z955 Presence of coronary angioplasty implant and graft: Secondary | ICD-10-CM | POA: Insufficient documentation

## 2022-04-03 DIAGNOSIS — Z79899 Other long term (current) drug therapy: Secondary | ICD-10-CM | POA: Diagnosis not present

## 2022-04-03 DIAGNOSIS — I252 Old myocardial infarction: Secondary | ICD-10-CM | POA: Insufficient documentation

## 2022-04-03 DIAGNOSIS — J45909 Unspecified asthma, uncomplicated: Secondary | ICD-10-CM | POA: Insufficient documentation

## 2022-04-03 DIAGNOSIS — Z7982 Long term (current) use of aspirin: Secondary | ICD-10-CM | POA: Insufficient documentation

## 2022-04-03 DIAGNOSIS — G4733 Obstructive sleep apnea (adult) (pediatric): Secondary | ICD-10-CM | POA: Diagnosis not present

## 2022-04-03 DIAGNOSIS — I5022 Chronic systolic (congestive) heart failure: Secondary | ICD-10-CM | POA: Diagnosis not present

## 2022-04-03 DIAGNOSIS — Z96641 Presence of right artificial hip joint: Secondary | ICD-10-CM | POA: Diagnosis not present

## 2022-04-03 DIAGNOSIS — Z7984 Long term (current) use of oral hypoglycemic drugs: Secondary | ICD-10-CM | POA: Insufficient documentation

## 2022-04-03 DIAGNOSIS — I110 Hypertensive heart disease with heart failure: Secondary | ICD-10-CM | POA: Diagnosis not present

## 2022-04-03 DIAGNOSIS — I255 Ischemic cardiomyopathy: Secondary | ICD-10-CM | POA: Diagnosis not present

## 2022-04-03 DIAGNOSIS — I2510 Atherosclerotic heart disease of native coronary artery without angina pectoris: Secondary | ICD-10-CM | POA: Insufficient documentation

## 2022-04-03 DIAGNOSIS — Z7902 Long term (current) use of antithrombotics/antiplatelets: Secondary | ICD-10-CM | POA: Diagnosis not present

## 2022-04-03 LAB — BASIC METABOLIC PANEL
Anion gap: 9 (ref 5–15)
BUN: 19 mg/dL (ref 8–23)
CO2: 26 mmol/L (ref 22–32)
Calcium: 9.3 mg/dL (ref 8.9–10.3)
Chloride: 108 mmol/L (ref 98–111)
Creatinine, Ser: 1.28 mg/dL — ABNORMAL HIGH (ref 0.61–1.24)
GFR, Estimated: 59 mL/min — ABNORMAL LOW (ref >60)
Glucose, Bld: 111 mg/dL — ABNORMAL HIGH (ref 70–99)
Potassium: 4.7 mmol/L (ref 3.5–5.1)
Sodium: 143 mmol/L (ref 135–145)

## 2022-04-03 LAB — BRAIN NATRIURETIC PEPTIDE: B Natriuretic Peptide: 91.7 pg/mL (ref 0.0–100.0)

## 2022-04-03 MED ORDER — CARVEDILOL 6.25 MG PO TABS: 6.2500 mg | ORAL_TABLET | Freq: Two times a day (BID) | ORAL | 11 refills | Status: DC

## 2022-04-03 NOTE — Progress Notes (Signed)
? ?ADVANCED HF CLINIC CONSULT NOTE ? ?Primary Care: Cyndi Bender, PA-C ?Primary Cardiologist: Dr. Gwenlyn Found ?HF Cardiologist: Dr. Aundra Dubin ? ?HPI: ?Walter Anderson is a 76 y.o.with a history of  hypertension, asthma, S/P R hip replacement 2018,  obstructive sleep apnea on CPAP, CAD and STEMI requiring DES to LAD (08/2421).  ?  ?Admitted with STEMI 09/11/21. Underwent cath and had DES . Echo showed EF had gone down from 55-60% -->30-35%.  Placed on DAPT with aspirin/brilinta for a least 1 year. Started on GDMT. Discharged on 09/13/21 with Lifevest.  ? ?Seen in [facility] 09/21/21 and referred to AHF clinic. ? ?Echo in 2/23 showed EF 40-45%, peri-apical hypokinesis, normal RV.  ?  ?Patient presents today for followup of CHF and CAD.  Weight stable.  No chest pain.  No dyspnea except with heavy exertion (heavy lifting).  No orthopnea/paroxysmal nocturnal dyspnea.  Rare lightheaded with standing.  ? ?Labs (10/22): LDL 86 ?Labs (12/22): K 4.3, creatinine 1.13 ?Labs (2/23): LDL 57, K 5.6, creatinine 1.09 ? ?PMH: ?1. BPH ?2. HTN ?3. Gout ?4. Asthma ?5. Right THR 2018 ?6. OSA: Uses CPAP.  ?7. CAD: Inferior STEMI 10/22 with DES to mid LAD.  ?8. Chronic systolic CHF: Ischemic cardiomyopathy.  ?- Echo (10/22): EF 30-35%, LAD WMAs, normal RV.  ?- Echo (2/23): EF 40-45%, peri-apical hypokinesis, normal RV.  ?  ?Review of Systems:  All systems reviewed and negative except as per HPI.  ? ?Current Outpatient Medications  ?Medication Sig Dispense Refill  ? acetaminophen (TYLENOL) 500 MG tablet Take 2,000 mg by mouth every 6 (six) hours as needed for pain.    ? allopurinol (ZYLOPRIM) 300 MG tablet Take 300 mg by mouth daily as needed.    ? Ascorbic Acid (VITAMIN C PO) Take 1 tablet by mouth daily.    ? aspirin 81 MG EC tablet Take 81 mg by mouth daily.    ? atorvastatin (LIPITOR) 80 MG tablet Take 1 tablet (80 mg total) by mouth daily. 90 tablet 3  ? Cholecalciferol (VITAMIN D3 PO) Take 250 mg by mouth daily.    ? Coenzyme Q10 400 MG CAPS Take 400 mg  by mouth daily.     ? docusate sodium (COLACE) 100 MG capsule Take 100 mg by mouth as needed for mild constipation.    ? empagliflozin (JARDIANCE) 10 MG TABS tablet Take 1 tablet (10 mg total) by mouth daily before breakfast. 90 tablet 3  ? furosemide (LASIX) 40 MG tablet TAKE 1 TABLET BY MOUTH DAILY AS NEEDED FOR FLUID OR EDEMA. 90 tablet 1  ? loratadine (CLARITIN) 10 MG tablet Take 10 mg by mouth daily as needed for allergies.    ? magnesium gluconate (MAGONATE) 500 MG tablet Take 500 mg by mouth daily.    ? nitroGLYCERIN (NITROSTAT) 0.4 MG SL tablet Place 1 tablet (0.4 mg total) under the tongue every 5 (five) minutes x 3 doses as needed for chest pain. 25 tablet 2  ? Omega-3 Fatty Acids (OMEGA 3 PO) Take 1 capsule by mouth daily.    ? sacubitril-valsartan (ENTRESTO) 97-103 MG Take 1 tablet by mouth 2 (two) times daily. 180 tablet 3  ? spironolactone (ALDACTONE) 25 MG tablet Take 1 tablet (25 mg total) by mouth daily. 30 tablet 6  ? Testosterone 20.25 MG/ACT (1.62%) GEL Apply 1 application topically daily.    ? ticagrelor (BRILINTA) 90 MG TABS tablet Take 1 tablet (90 mg total) by mouth 2 (two) times daily. 180 tablet 2  ? carvedilol (COREG)  6.25 MG tablet Take 1 tablet (6.25 mg total) by mouth 2 (two) times daily with a meal. 60 tablet 11  ? ?No current facility-administered medications for this encounter.  ? ?No Known Allergies ? ?Social History  ? ?Socioeconomic History  ? Marital status: Married  ?  Spouse name: Quadre Dabdoub  ? Number of children: 2  ? Years of education: Not on file  ? Highest education level: Associate degree: occupational, Hotel manager, or vocational program  ?Occupational History  ? Occupation: heavy Radio producer  ?Tobacco Use  ? Smoking status: Never  ? Smokeless tobacco: Former  ?  Types: Chew  ?  Quit date: 11/15/2005  ?Vaping Use  ? Vaping Use: Never used  ?Substance and Sexual Activity  ? Alcohol use: Yes  ?  Alcohol/week: 4.0 standard drinks  ?  Types: 4 Shots of liquor per week   ?  Comment: 1-2 Weekly  ? Drug use: No  ? Sexual activity: Not Currently  ?Other Topics Concern  ? Not on file  ?Social History Narrative  ? Not on file  ? ?Social Determinants of Health  ? ?Financial Resource Strain: Low Risk   ? Difficulty of Paying Living Expenses: Not very hard  ?Food Insecurity: No Food Insecurity  ? Worried About Charity fundraiser in the Last Year: Never true  ? Ran Out of Food in the Last Year: Never true  ?Transportation Needs: No Transportation Needs  ? Lack of Transportation (Medical): No  ? Lack of Transportation (Non-Medical): No  ?Physical Activity: Not on file  ?Stress: Not on file  ?Social Connections: Not on file  ?Intimate Partner Violence: Not on file  ? ?Family History  ?Problem Relation Age of Onset  ? Diabetes Mother   ? COPD Father   ? Heart disease Father   ? ?Wt Readings from Last 3 Encounters:  ?01/24/22 118.6 kg (261 lb 6.4 oz)  ?01/02/22 118.8 kg (262 lb)  ?11/08/21 120.7 kg (266 lb)  ? ?BP (!) 90/58   Pulse (!) 56   SpO2 97%  ? ?PHYSICAL EXAM: ?General: NAD ?Neck: No JVD, no thyromegaly or thyroid nodule.  ?Lungs: Clear to auscultation bilaterally with normal respiratory effort. ?CV: Nondisplaced PMI.  Heart regular S1/S2, no S3/S4, no murmur.  No peripheral edema.  No carotid bruit.  Normal pedal pulses.  ?Abdomen: Soft, nontender, no hepatosplenomegaly, no distention.  ?Skin: Intact without lesions or rashes.  ?Neurologic: Alert and oriented x 3.  ?Psych: Normal affect. ?Extremities: No clubbing or cyanosis.  ?HEENT: Normal.  ? ?ASSESSMENT & PLAN:  ?1.  Chronic systolic CHF: Ischemic cardiomyopathy. Echo in 10/22 with EF 30-35% post-STEMI.  Echo in 2/23 with EF 40-45%, peri-apical hypokinesis. He is not volume overloaded on exam, NYHA class I-II.  ?- Out of range for ICD.  ?- Continue Lasix PRN for 3-4 pound weight gain in 24 hours.  ?- Increase Coreg to 6.25 mg bid.  ?- Continue Entresto 97/103 bid. BMET today. ?- Continue spironolactone 25 mg daily. ?- Continue  Jardiance 10 mg daily. No GU symptoms. ?2. CAD: 10/22 anterior STEMI with DES to mLAD.  No chest pain.  ?- Continue ASA 81 and ticagrelor.  ?- Continue atorvastatin 80 daily.  Good lipids in 2/23.  ?3. HTN: BP controlled.  ?- Stop doxazosin to allow more BP room.  He denies BPH and says that he has been on doxazosin for BP.  ?4. OSA: Continue to use CPAP.  ?  ?Followup in 4 months with APP.  ? ?  Mansour Balboa Aundra Dubin ?04/03/2022 ? ?

## 2022-04-03 NOTE — Patient Instructions (Signed)
STOP Cardura ?INCREASE Carvedilol to 6.25 mg,one tab twice a day ? ?Labs today ?We will only contact you if something comes back abnormal or we need to make some changes. ?Otherwise no news is good news! ? ?Your physician recommends that you schedule a follow-up appointment in: 4 months  in the Advanced Practitioners (PA/NP) Clinic  ? ? ?Do the following things EVERYDAY: ?Weigh yourself in the morning before breakfast. Write it down and keep it in a log. ?Take your medicines as prescribed ?Eat low salt foods--Limit salt (sodium) to 2000 mg per day.  ?Stay as active as you can everyday ?Limit all fluids for the day to less than 2 liters ? ?At the Advanced Heart Failure Clinic, you and your health needs are our priority. As part of our continuing mission to provide you with exceptional heart care, we have created designated Provider Care Teams. These Care Teams include your primary Cardiologist (physician) and Advanced Practice Providers (APPs- Physician Assistants and Nurse Practitioners) who all work together to provide you with the care you need, when you need it.  ? ?You may see any of the following providers on your designated Care Team at your next follow up: ?Dr Arvilla Meres ?Dr Marca Ancona ?Tonye Becket, NP ?Robbie Lis, PA ?Jessica Milford,NP ?Anna Genre, PA ?Karle Plumber, PharmD ? ? ?Please be sure to bring in all your medications bottles to every appointment.  ? ?If you have any questions or concerns before your next appointment please send [place] a message through [place] or call our office at [phone].   ? ?TO LEAVE A MESSAGE FOR THE NURSE SELECT OPTION 2, PLEASE LEAVE A MESSAGE INCLUDING: ?YOUR NAME ?DATE OF BIRTH ?CALL BACK NUMBER ?REASON FOR CALL**this is important as we prioritize the call backs ? ?YOU WILL RECEIVE A CALL BACK THE SAME DAY AS LONG AS YOU CALL BEFORE 4:00 PM ? ? ?

## 2022-04-24 DIAGNOSIS — M109 Gout, unspecified: Secondary | ICD-10-CM | POA: Diagnosis not present

## 2022-05-03 DIAGNOSIS — M109 Gout, unspecified: Secondary | ICD-10-CM | POA: Diagnosis not present

## 2022-05-03 DIAGNOSIS — Z6835 Body mass index (BMI) 35.0-35.9, adult: Secondary | ICD-10-CM | POA: Diagnosis not present

## 2022-05-03 DIAGNOSIS — I959 Hypotension, unspecified: Secondary | ICD-10-CM | POA: Diagnosis not present

## 2022-05-08 ENCOUNTER — Other Ambulatory Visit (HOSPITAL_COMMUNITY): Payer: Self-pay | Admitting: Family Medicine

## 2022-05-17 DIAGNOSIS — I10 Essential (primary) hypertension: Secondary | ICD-10-CM | POA: Diagnosis not present

## 2022-05-17 DIAGNOSIS — M109 Gout, unspecified: Secondary | ICD-10-CM | POA: Diagnosis not present

## 2022-06-08 ENCOUNTER — Other Ambulatory Visit: Payer: Self-pay | Admitting: Cardiology

## 2022-06-08 ENCOUNTER — Other Ambulatory Visit (HOSPITAL_COMMUNITY): Payer: Self-pay | Admitting: *Deleted

## 2022-06-08 NOTE — Telephone Encounter (Signed)
This is a CHF pt 

## 2022-06-25 ENCOUNTER — Other Ambulatory Visit (HOSPITAL_COMMUNITY): Payer: Self-pay | Admitting: Adult Health

## 2022-07-06 DIAGNOSIS — N529 Male erectile dysfunction, unspecified: Secondary | ICD-10-CM | POA: Diagnosis not present

## 2022-07-06 DIAGNOSIS — Z79899 Other long term (current) drug therapy: Secondary | ICD-10-CM | POA: Diagnosis not present

## 2022-07-06 DIAGNOSIS — E349 Endocrine disorder, unspecified: Secondary | ICD-10-CM | POA: Diagnosis not present

## 2022-07-06 DIAGNOSIS — I10 Essential (primary) hypertension: Secondary | ICD-10-CM | POA: Diagnosis not present

## 2022-07-06 DIAGNOSIS — G4733 Obstructive sleep apnea (adult) (pediatric): Secondary | ICD-10-CM | POA: Diagnosis not present

## 2022-07-06 DIAGNOSIS — I5022 Chronic systolic (congestive) heart failure: Secondary | ICD-10-CM | POA: Diagnosis not present

## 2022-07-06 DIAGNOSIS — I2510 Atherosclerotic heart disease of native coronary artery without angina pectoris: Secondary | ICD-10-CM | POA: Diagnosis not present

## 2022-07-06 DIAGNOSIS — R7303 Prediabetes: Secondary | ICD-10-CM | POA: Diagnosis not present

## 2022-07-06 DIAGNOSIS — M109 Gout, unspecified: Secondary | ICD-10-CM | POA: Diagnosis not present

## 2022-07-10 IMAGING — CR DG CHEST 2V
1 series · 2 of 2 positions shown · non-contrast
Comparison: Chest radiograph dated 09/10/2019.

CLINICAL DATA: 74-year-old male with concern for foreign body
ingestion.

EXAM:
CHEST - 2 VIEW

[Series 1: dg chest 2 view · 0.14mm/px · 2 of 2 slices shown]
[im 1/2]
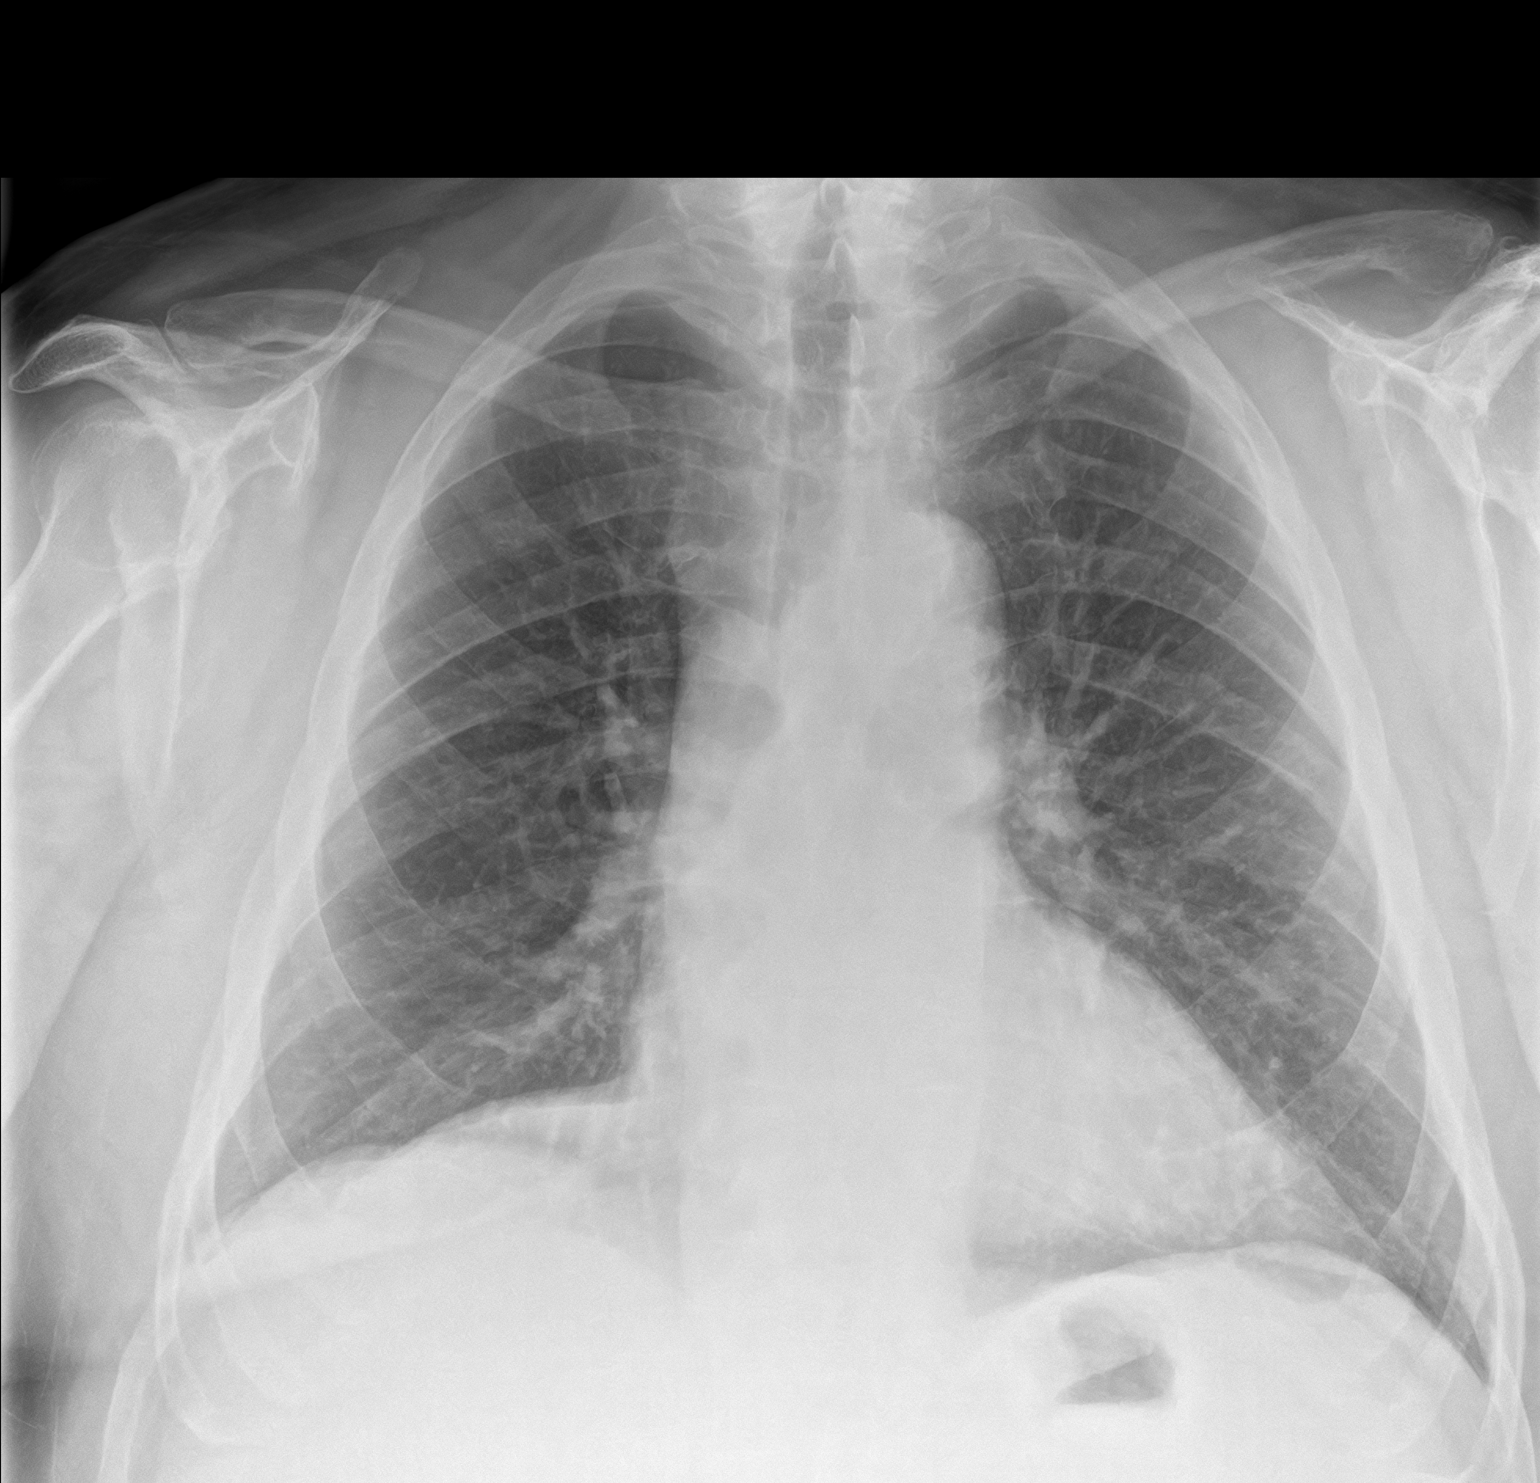
[im 2/2]
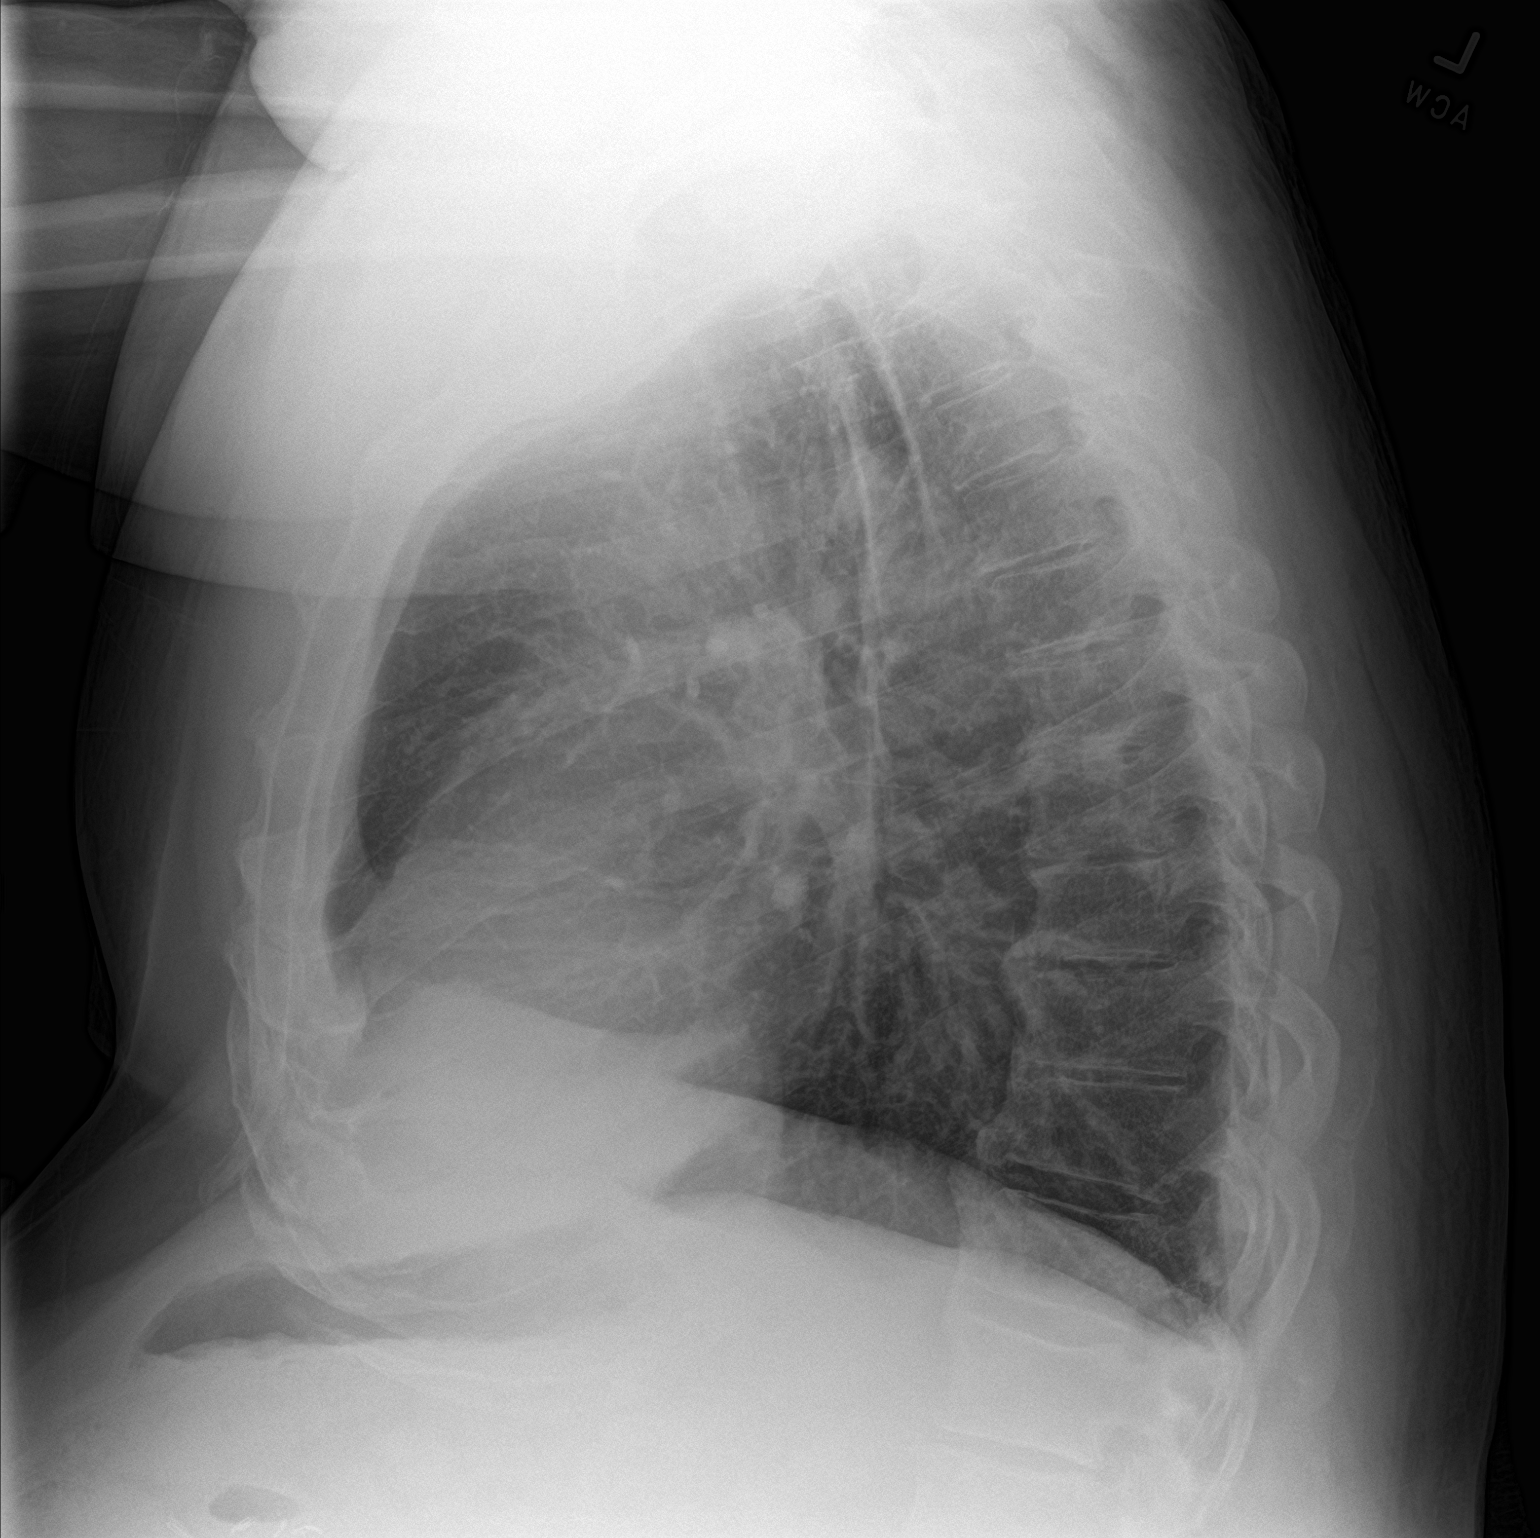

[2 of 2 positions shown; findings below may reference images not displayed]

FINDINGS: No radiopaque foreign object identified. No focal consolidation,
pleural effusion, or pneumothorax. Stable cardiac silhouette.
Degenerative changes of the spine. No acute osseous pathology.
IMPRESSION: No active cardiopulmonary disease.

## 2022-07-20 ENCOUNTER — Other Ambulatory Visit (HOSPITAL_COMMUNITY): Payer: Medicare PPO

## 2022-07-20 ENCOUNTER — Telehealth (HOSPITAL_COMMUNITY): Payer: Self-pay | Admitting: Pharmacy Technician

## 2022-07-20 DIAGNOSIS — Z79899 Other long term (current) drug therapy: Secondary | ICD-10-CM | POA: Diagnosis not present

## 2022-07-20 DIAGNOSIS — E349 Endocrine disorder, unspecified: Secondary | ICD-10-CM | POA: Diagnosis not present

## 2022-07-20 DIAGNOSIS — I2510 Atherosclerotic heart disease of native coronary artery without angina pectoris: Secondary | ICD-10-CM | POA: Diagnosis not present

## 2022-07-20 DIAGNOSIS — M109 Gout, unspecified: Secondary | ICD-10-CM | POA: Diagnosis not present

## 2022-07-20 NOTE — Telephone Encounter (Signed)
Advanced Heart Failure Patient Advocate Encounter  Called and left the patient a message to assess for assistance eligibility for Valla Leaver and Brilinta.  Archer Asa, CPhT

## 2022-07-24 NOTE — Telephone Encounter (Signed)
Advanced Heart Failure Patient Advocate Encounter  Patient's wife was seen in clinic today. Unfortunately both he and his wife are over the income for any kind of assistance or grants.  Patient made aware.  Archer Asa, CPhT

## 2022-07-25 ENCOUNTER — Ambulatory Visit: Payer: Medicare PPO | Attending: Cardiovascular Disease | Admitting: Cardiovascular Disease

## 2022-07-25 ENCOUNTER — Telehealth (HOSPITAL_COMMUNITY): Payer: Self-pay | Admitting: Cardiology

## 2022-07-25 ENCOUNTER — Encounter: Payer: Self-pay | Admitting: Cardiovascular Disease

## 2022-07-25 VITALS — BP 130/70 | HR 62 | Ht 72.0 in | Wt 263.0 lb

## 2022-07-25 DIAGNOSIS — I10 Essential (primary) hypertension: Secondary | ICD-10-CM | POA: Diagnosis not present

## 2022-07-25 DIAGNOSIS — I255 Ischemic cardiomyopathy: Secondary | ICD-10-CM

## 2022-07-25 DIAGNOSIS — E782 Mixed hyperlipidemia: Secondary | ICD-10-CM | POA: Diagnosis not present

## 2022-07-25 DIAGNOSIS — I2102 ST elevation (STEMI) myocardial infarction involving left anterior descending coronary artery: Secondary | ICD-10-CM | POA: Diagnosis not present

## 2022-07-25 NOTE — Assessment & Plan Note (Signed)
History of anterior STEMI 09/07/2021.  I opened up an occluded mid LAD with a Medtronic frontier 3 mm x 15 mm long drug-eluting stent postdilated to 3.25 mm.  His door to balloon time was 37 minutes.  He is on DAPT with aspirin and Brilinta.  He is asymptomatic.

## 2022-07-25 NOTE — Assessment & Plan Note (Signed)
History of hyperlipidemia on high-dose statin therapy with lipid profile performed 07/20/2022 revealing total cholesterol 95, LDL 43 and HDL 30.

## 2022-07-25 NOTE — Progress Notes (Signed)
07/25/2022 MCADOO MUZQUIZ   August 10, 1946  [id number]  Primary Physician Lonie Peak, PA-C Primary Cardiologist: Runell Gess MD FACP, [place], [place], [place]  HPI:  Walter Anderson is a 76 y.o.  severely overweight married Caucasian male father of 2, grandfather of 4 grandchildren who I last saw in the office 01/24/2022.  He works as an Financial trader.  His primary provider is Lonie Peak, [address], in [place].  He has a history of treated hypertension, diabetes and hyperlipidemia.  He also has obstructive sleep apnea on CPAP.  He had an anterior STEMI 09/11/2021.  I opened up a occluded mid LAD with a Medtronic frontier 3 mm x 15 mm long drug-eluting stent postdilated to 3.25 mm with a door to balloon time of 37 minutes.  He was discharged home on a LifeVest.  He has seen Dr. Shirlee Latch in the heart failure clinic.  His most recent 2D echo revealed an increase in his LV function from 30 to 35% of the time of his myocardial infarction up to 40 to 45% by 2D echo 01/02/2022.  He is on guideline directed optimal medical therapy.  Since I saw him 6 months ago he continues to do well.  He is compliant with his medications.  He does not have "the energy" that he had in the past but specifically denies chest pain or shortness of breath.    Current Meds  Medication Sig   acetaminophen (TYLENOL) 500 MG tablet Take 2,000 mg by mouth every 6 (six) hours as needed for pain.   allopurinol (ZYLOPRIM) 300 MG tablet Take 300 mg by mouth daily as needed.   Ascorbic Acid (VITAMIN C PO) Take 1 tablet by mouth daily.   aspirin 81 MG EC tablet Take 81 mg by mouth daily.   atorvastatin (LIPITOR) 80 MG tablet Take 1 tablet (80 mg total) by mouth daily.   BRILINTA 90 MG TABS tablet TAKE 1 TABLET BY MOUTH TWICE A DAY   carvedilol (COREG) 6.25 MG tablet Take 1 tablet (6.25 mg total) by mouth 2 (two) times daily with a meal.   Cholecalciferol (VITAMIN D3 PO) Take 250 mg by mouth daily.   Coenzyme Q10 400 MG CAPS Take  400 mg by mouth daily.    docusate sodium (COLACE) 100 MG capsule Take 100 mg by mouth as needed for mild constipation.   furosemide (LASIX) 40 MG tablet TAKE 1 TABLET BY MOUTH DAILY AS NEEDED FOR FLUID OR EDEMA.   JARDIANCE 10 MG TABS tablet TAKE 1 TABLET BY MOUTH DAILY BEFORE BREAKFAST.   loratadine (CLARITIN) 10 MG tablet Take 10 mg by mouth daily as needed for allergies.   magnesium gluconate (MAGONATE) 500 MG tablet Take 500 mg by mouth daily.   nitroGLYCERIN (NITROSTAT) 0.4 MG SL tablet Place 1 tablet (0.4 mg total) under the tongue every 5 (five) minutes x 3 doses as needed for chest pain.   Omega-3 Fatty Acids (OMEGA 3 PO) Take 1 capsule by mouth daily.   sacubitril-valsartan (ENTRESTO) 97-103 MG Take 1 tablet by mouth 2 (two) times daily.   spironolactone (ALDACTONE) 25 MG tablet TAKE 1 TABLET (25 MG TOTAL) BY MOUTH DAILY.   Testosterone 20.25 MG/ACT (1.62%) GEL Apply 1 application topically daily.     No Known Allergies  Social History   Socioeconomic History   Marital status: Married    Spouse name: Walter Anderson   Number of children: 2   Years of education: Not on file  Highest education level: Associate degree: occupational, Scientist, product/process development, or vocational program  Occupational History   Occupation: heavy Media planner  Tobacco Use   Smoking status: Never   Smokeless tobacco: Former    Types: Chew    Quit date: 11/15/2005  Vaping Use   Vaping Use: Never used  Substance and Sexual Activity   Alcohol use: Yes    Alcohol/week: 4.0 standard drinks of alcohol    Types: 4 Shots of liquor per week    Comment: 1-2 Weekly   Drug use: No   Sexual activity: Not Currently  Other Topics Concern   Not on file  Social History Narrative   Not on file   Social Determinants of Health   Financial Resource Strain: Low Risk  (09/13/2021)   Overall Financial Resource Strain (CARDIA)    Difficulty of Paying Living Expenses: Not very hard  Food Insecurity: No Food Insecurity  (09/13/2021)   Hunger Vital Sign    Worried About Running Out of Food in the Last Year: Never true    Ran Out of Food in the Last Year: Never true  Transportation Needs: No Transportation Needs (09/13/2021)   PRAPARE - Administrator, Civil Service (Medical): No    Lack of Transportation (Non-Medical): No  Physical Activity: Not on file  Stress: Not on file  Social Connections: Not on file  Intimate Partner Violence: Not on file     Review of Systems: General: negative for chills, fever, night sweats or weight changes.  Cardiovascular: negative for chest pain, dyspnea on exertion, edema, orthopnea, palpitations, paroxysmal nocturnal dyspnea or shortness of breath Dermatological: negative for rash Respiratory: negative for cough or wheezing Urologic: negative for hematuria Abdominal: negative for nausea, vomiting, diarrhea, bright red blood per rectum, melena, or hematemesis Neurologic: negative for visual changes, syncope, or dizziness All other systems reviewed and are otherwise negative except as noted above.    Blood pressure 130/70, pulse 62, height 6' (1.829 m), weight 263 lb (119.3 kg), SpO2 95 %.  General appearance: alert and no distress Neck: no adenopathy, no carotid bruit, no JVD, supple, symmetrical, trachea midline, and thyroid not enlarged, symmetric, no tenderness/mass/nodules Lungs: clear to auscultation bilaterally Heart: regular rate and rhythm, S1, S2 normal, no murmur, click, rub or gallop Extremities: extremities normal, atraumatic, no cyanosis or edema Pulses: 2+ and symmetric Skin: Skin color, texture, turgor normal. No rashes or lesions Neurologic: Grossly normal  EKG sinus rhythm at 62 with low limb voltage, left axis deviation with Q waves across his precordium consistent with a old anterior lateral myocardial infarction.  I personally reviewed this EKG.  ASSESSMENT AND PLAN:   STEMI (ST elevation myocardial infarction) (HCC) History of  anterior STEMI 09/07/2021.  I opened up an occluded mid LAD with a Medtronic frontier 3 mm x 15 mm long drug-eluting stent postdilated to 3.25 mm.  His door to balloon time was 37 minutes.  He is on DAPT with aspirin and Brilinta.  He is asymptomatic.  Hyperlipidemia History of hyperlipidemia on high-dose statin therapy with lipid profile performed 07/20/2022 revealing total cholesterol 95, LDL 43 and HDL 30.  Hypertension History of essential hypertension blood pressure measured today at 130/70.  He is on carvedilol and Entresto.  Ischemic cardiomyopathy History of ischemic cardiomyopathy with an EF initially at the time of his index event of 30 to 35%.  He was discharged home on a LifeVest.  Follow-up echo performed 01/02/2022 roast mildly up to 40 to 45%.  He is  on guideline directed optimal medical therapy.  We will recheck an echo after the first the year.     Runell Gess MD FACP,FACC,FAHA, [facility] 07/25/2022 9:51 AM

## 2022-07-25 NOTE — Telephone Encounter (Signed)
Abnormal labs received from PCP Labs drawn 07/21/22 K 5.4  Per Prince Rome, NP Decrease spironolactone to 12.5 mg Repeat bmet in 10-14 after decrease  Patient seen by Dr Allyson Sabal 9/6 however plan to repeat labs x 4-6 weeks   [facility] for patient

## 2022-07-25 NOTE — Patient Instructions (Signed)
Medication Instructions:  Your physician recommends that you continue on your current medications as directed. Please refer to the Current Medication list given to you today.  *If you need a refill on your cardiac medications before your next appointment, please call your pharmacy*   Lab Work: Your physician recommends that you return for lab work in: 4-6 weeks for BMET  If you have labs (blood work) drawn today and your tests are completely normal, you will receive your results only by: MyChart Message (if you have MyChart) OR A paper copy in the mail If you have any lab test that is abnormal or we need to change your treatment, we will call you to review the results.   Testing/Procedures: Your physician has requested that you have an echocardiogram. Echocardiography is a painless test that uses sound waves to create images of your heart. It provides your doctor with information about the size and shape of your heart and how well your heart's chambers and valves are working. This procedure takes approximately one hour. There are no restrictions for this procedure. To be done in Jan/Feb 2024. This procedure will be done at [address]    Follow-Up: At [facility], you and your health needs are our priority.  As part of our continuing mission to provide you with exceptional heart care, we have created designated Provider Care Teams.  These Care Teams include your primary Cardiologist (physician) and Advanced Practice Providers (APPs -  Physician Assistants and Nurse Practitioners) who all work together to provide you with the care you need, when you need it.  We recommend signing up for the patient portal called "MyChart".  Sign up information is provided on this After Visit Summary.  MyChart is used to connect with patients for Virtual Visits (Telemedicine).  Patients are able to view lab/test results, encounter notes, upcoming appointments, etc.  Non-urgent messages  can be sent to your provider as well.   To learn more about what you can do with MyChart, go to [email or web address].    Your next appointment:   6 month(s)  The format for your next appointment:   In Person  Provider:   Micah Flesher, PA-C, Marjie Skiff, PA-C, Joni Reining, DNP, ANP, Azalee Course, PA-C, or Bernadene Person, NP       Then, Nanetta Batty, MD will plan to see you again in 12 month(s).

## 2022-07-25 NOTE — Assessment & Plan Note (Signed)
History of essential hypertension blood pressure measured today at 130/70.  He is on carvedilol and Entresto.

## 2022-07-25 NOTE — Assessment & Plan Note (Signed)
History of ischemic cardiomyopathy with an EF initially at the time of his index event of 30 to 35%.  He was discharged home on a LifeVest.  Follow-up echo performed 01/02/2022 roast mildly up to 40 to 45%.  He is on guideline directed optimal medical therapy.  We will recheck an echo after the first the year.

## 2022-08-02 ENCOUNTER — Encounter (HOSPITAL_COMMUNITY): Payer: Medicare PPO

## 2022-08-07 ENCOUNTER — Ambulatory Visit (HOSPITAL_COMMUNITY)
Admission: RE | Admit: 2022-08-07 | Discharge: 2022-08-07 | Disposition: A | Discharge status: Home / Self Care | Payer: Medicare PPO | Source: Ambulatory Visit | Attending: Family Medicine | Admitting: Family Medicine

## 2022-08-07 ENCOUNTER — Encounter (HOSPITAL_COMMUNITY): Payer: Self-pay

## 2022-08-07 VITALS — BP 122/66 | HR 63 | Wt 266.0 lb

## 2022-08-07 DIAGNOSIS — I10 Essential (primary) hypertension: Secondary | ICD-10-CM | POA: Diagnosis not present

## 2022-08-07 DIAGNOSIS — Z79899 Other long term (current) drug therapy: Secondary | ICD-10-CM | POA: Insufficient documentation

## 2022-08-07 DIAGNOSIS — Z9989 Dependence on other enabling machines and devices: Secondary | ICD-10-CM

## 2022-08-07 DIAGNOSIS — Z7984 Long term (current) use of oral hypoglycemic drugs: Secondary | ICD-10-CM | POA: Insufficient documentation

## 2022-08-07 DIAGNOSIS — Z7902 Long term (current) use of antithrombotics/antiplatelets: Secondary | ICD-10-CM | POA: Diagnosis not present

## 2022-08-07 DIAGNOSIS — J45909 Unspecified asthma, uncomplicated: Secondary | ICD-10-CM | POA: Insufficient documentation

## 2022-08-07 DIAGNOSIS — Z955 Presence of coronary angioplasty implant and graft: Secondary | ICD-10-CM | POA: Diagnosis not present

## 2022-08-07 DIAGNOSIS — I252 Old myocardial infarction: Secondary | ICD-10-CM | POA: Insufficient documentation

## 2022-08-07 DIAGNOSIS — I5022 Chronic systolic (congestive) heart failure: Secondary | ICD-10-CM | POA: Diagnosis not present

## 2022-08-07 DIAGNOSIS — R5383 Other fatigue: Secondary | ICD-10-CM | POA: Diagnosis not present

## 2022-08-07 DIAGNOSIS — I110 Hypertensive heart disease with heart failure: Secondary | ICD-10-CM | POA: Insufficient documentation

## 2022-08-07 DIAGNOSIS — I255 Ischemic cardiomyopathy: Secondary | ICD-10-CM | POA: Insufficient documentation

## 2022-08-07 DIAGNOSIS — N400 Enlarged prostate without lower urinary tract symptoms: Secondary | ICD-10-CM | POA: Diagnosis not present

## 2022-08-07 DIAGNOSIS — I2510 Atherosclerotic heart disease of native coronary artery without angina pectoris: Secondary | ICD-10-CM

## 2022-08-07 DIAGNOSIS — M109 Gout, unspecified: Secondary | ICD-10-CM | POA: Insufficient documentation

## 2022-08-07 DIAGNOSIS — G4733 Obstructive sleep apnea (adult) (pediatric): Secondary | ICD-10-CM | POA: Diagnosis not present

## 2022-08-07 LAB — CBC
HCT: 43.1 % (ref 39.0–52.0)
Hemoglobin: 13.6 g/dL (ref 13.0–17.0)
MCH: 31.3 pg (ref 26.0–34.0)
MCHC: 31.6 g/dL (ref 30.0–36.0)
MCV: 99.3 fL (ref 80.0–100.0)
Platelets: 150 10*3/uL (ref 150–400)
RBC: 4.34 MIL/uL (ref 4.22–5.81)
RDW: 15.4 % (ref 11.5–15.5)
WBC: 7.6 10*3/uL (ref 4.0–10.5)
nRBC: 0 % (ref 0.0–0.2)

## 2022-08-07 LAB — IRON AND TIBC
Iron: 54 ug/dL (ref 45–182)
Saturation Ratios: 19 % (ref 17.9–39.5)
TIBC: 291 ug/dL (ref 250–450)
UIBC: 237 ug/dL

## 2022-08-07 LAB — FERRITIN: Ferritin: 283 ng/mL (ref 24–336)

## 2022-08-07 LAB — BRAIN NATRIURETIC PEPTIDE: B Natriuretic Peptide: 218.6 pg/mL — ABNORMAL HIGH (ref 0.0–100.0)

## 2022-08-07 LAB — TSH: TSH: 2.788 u[IU]/mL (ref 0.350–4.500)

## 2022-08-07 LAB — BASIC METABOLIC PANEL
Anion gap: 11 (ref 5–15)
BUN: 19 mg/dL (ref 8–23)
CO2: 22 mmol/L (ref 22–32)
Calcium: 9.1 mg/dL (ref 8.9–10.3)
Chloride: 107 mmol/L (ref 98–111)
Creatinine, Ser: 1.05 mg/dL (ref 0.61–1.24)
GFR, Estimated: >60 mL/min (ref >60)
Glucose, Bld: 126 mg/dL — ABNORMAL HIGH (ref 70–99)
Potassium: 4.5 mmol/L (ref 3.5–5.1)
Sodium: 140 mmol/L (ref 135–145)

## 2022-08-07 NOTE — Progress Notes (Signed)
ADVANCED HF CLINIC NOTE  Primary Care: Cyndi Bender, PA-C Primary Cardiologist: Dr. Gwenlyn Found HF Cardiologist: Dr. Aundra Dubin  HPI: Walter Anderson is a 76 y.o.with a history of  hypertension, asthma, S/P R hip replacement 2018,  obstructive sleep apnea on CPAP, CAD and STEMI requiring DES to LAD (09/11/21).    Admitted with STEMI 09/11/21. Underwent cath and had DES . Echo showed EF had gone down from 55-60% -->30-35%.  Placed on DAPT with aspirin/brilinta for a least 1 year. Started on GDMT. Discharged on 09/13/21 with Lifevest.   Seen in [facility] 09/21/21 and referred to AHF clinic.  Echo in 2/23 showed EF 40-45%, peri-apical hypokinesis, normal RV.    Today he returns for HF follow up. Overall feeling fine, but remains fatigued since his MI. Has dyspnea when he pushing himself working in his garden. Denies palpitations, abnormal bleeding, CP, dizziness, edema, or PND/Orthopnea. Appetite ok. No fever or chills. Weight at home 260 pounds. Taking all medications. Uses CPAP. Take Lasix every week or so.  Labs (10/22): LDL 86 Labs (12/22): K 4.3, creatinine 1.13 Labs (2/23): LDL 57, K 5.6, creatinine 1.09 Labs (5/23): K 4.7, creatinine 1.28  PMH: 1. BPH 2. HTN 3. Gout 4. Asthma 5. Right THR 2018 6. OSA: Uses CPAP.  7. CAD: Inferior STEMI 10/22 with DES to mid LAD.  8. Chronic systolic CHF: Ischemic cardiomyopathy.  - Echo (10/22): EF 30-35%, LAD WMAs, normal RV.  - Echo (2/23): EF 40-45%, peri-apical hypokinesis, normal RV.    Review of Systems:  All systems reviewed and negative except as per HPI.   Current Outpatient Medications  Medication Sig Dispense Refill   acetaminophen (TYLENOL) 500 MG tablet Take 2,000 mg by mouth every 6 (six) hours as needed for pain.     allopurinol (ZYLOPRIM) 300 MG tablet Take 300 mg by mouth daily as needed.     Ascorbic Acid (VITAMIN C PO) Take 1 tablet by mouth daily.     aspirin 81 MG EC tablet Take 81 mg by mouth daily.     atorvastatin (LIPITOR) 80 MG  tablet Take 1 tablet (80 mg total) by mouth daily. 90 tablet 3   BRILINTA 90 MG TABS tablet TAKE 1 TABLET BY MOUTH TWICE A DAY 180 tablet 3   carvedilol (COREG) 6.25 MG tablet Take 1 tablet (6.25 mg total) by mouth 2 (two) times daily with a meal. 60 tablet 11   Cholecalciferol (VITAMIN D3 PO) Take 250 mg by mouth daily.     Coenzyme Q10 400 MG CAPS Take 400 mg by mouth daily.      docusate sodium (COLACE) 100 MG capsule Take 100 mg by mouth as needed for mild constipation.     furosemide (LASIX) 40 MG tablet TAKE 1 TABLET BY MOUTH DAILY AS NEEDED FOR FLUID OR EDEMA. 90 tablet 1   JARDIANCE 10 MG TABS tablet TAKE 1 TABLET BY MOUTH DAILY BEFORE BREAKFAST. 90 tablet 3   loratadine (CLARITIN) 10 MG tablet Take 10 mg by mouth daily as needed for allergies.     magnesium gluconate (MAGONATE) 500 MG tablet Take 500 mg by mouth daily.     nitroGLYCERIN (NITROSTAT) 0.4 MG SL tablet Place 1 tablet (0.4 mg total) under the tongue every 5 (five) minutes x 3 doses as needed for chest pain. 25 tablet 2   Omega-3 Fatty Acids (OMEGA 3 PO) Take 1 capsule by mouth daily.     sacubitril-valsartan (ENTRESTO) 97-103 MG Take 1 tablet by mouth 2 (two)  times daily. 180 tablet 3   spironolactone (ALDACTONE) 25 MG tablet TAKE 1 TABLET (25 MG TOTAL) BY MOUTH DAILY. 90 tablet 3   Testosterone 20.25 MG/ACT (1.62%) GEL Apply 1 application topically daily.     No current facility-administered medications for this encounter.   No Known Allergies  Social History   Socioeconomic History   Marital status: Married    Spouse name: Walter Anderson   Number of children: 2   Years of education: Not on file   Highest education level: Associate degree: occupational, Hotel manager, or vocational program  Occupational History   Occupation: heavy Radio producer  Tobacco Use   Smoking status: Never   Smokeless tobacco: Former    Types: Chew    Quit date: 11/15/2005  Vaping Use   Vaping Use: Never used  Substance and Sexual  Activity   Alcohol use: Yes    Alcohol/week: 4.0 standard drinks of alcohol    Types: 4 Shots of liquor per week    Comment: 1-2 Weekly   Drug use: No   Sexual activity: Not Currently  Other Topics Concern   Not on file  Social History Narrative   Not on file   Social Determinants of Health   Financial Resource Strain: Low Risk  (09/13/2021)   Overall Financial Resource Strain (CARDIA)    Difficulty of Paying Living Expenses: Not very hard  Food Insecurity: No Food Insecurity (09/13/2021)   Hunger Vital Sign    Worried About Running Out of Food in the Last Year: Never true    Ran Out of Food in the Last Year: Never true  Transportation Needs: No Transportation Needs (09/13/2021)   PRAPARE - Hydrologist (Medical): No    Lack of Transportation (Non-Medical): No  Physical Activity: Not on file  Stress: Not on file  Social Connections: Not on file  Intimate Partner Violence: Not on file   Family History  Problem Relation Age of Onset   Diabetes Mother    COPD Father    Heart disease Father    Wt Readings from Last 3 Encounters:  08/07/22 120.7 kg (266 lb)  07/25/22 119.3 kg (263 lb)  01/24/22 118.6 kg (261 lb 6.4 oz)   BP 122/66   Pulse 63   Wt 120.7 kg (266 lb)   SpO2 99%   BMI 36.08 kg/m   PHYSICAL EXAM: General:  NAD. No resp difficulty HEENT: Normal Neck: Supple. No JVD, thick neck. Carotids 2+ bilat; no bruits. No lymphadenopathy or thryomegaly appreciated. Cor: PMI nondisplaced. Regular rate & rhythm. No rubs, gallops or murmurs. Lungs: Clear Abdomen: Obese, soft, nontender, nondistended. No hepatosplenomegaly. No bruits or masses. Good bowel sounds. Extremities: No cyanosis, clubbing, rash, edema Neuro: Alert & oriented x 3, cranial nerves grossly intact. Moves all 4 extremities w/o difficulty. Affect pleasant.  ASSESSMENT & PLAN:  1.  Chronic systolic CHF: Ischemic cardiomyopathy. Echo in 10/22 with EF 30-35% post-STEMI.   Echo in 2/23 with EF 40-45%, peri-apical hypokinesis. He is not volume overloaded on exam, NYHA class II, mostly fatigue.  - Out of range for ICD.  - Continue Lasix PRN. - Continue Coreg 6.25 mg bid.  - Continue Entresto 97/103 bid. BMET/BNP today. - Continue spironolactone 25 mg daily. - Continue Jardiance 10 mg daily. No GU symptoms. 2. CAD: 10/22 anterior STEMI with DES to mLAD.  No chest pain.  - Continue ASA 81 and ticagrelor. Can likely stop ticagrelor at the end of next month. Will  defer to Dr. Gwenlyn Found. - Continue atorvastatin 80 daily.  Good lipids in 2/23.  3. HTN: BP controlled.  4. OSA: Continue to use CPAP.  5. Fatigue: likely multifactorial. Continue CPAP. - Check TSH, CBC, TIBC/iron and ferratin. - We discussed Cardiac Rehab, but he declines today.  Followup in 4 months with Dr. Aundra Dubin.  Maricela Bo [facility] FNP-BC 08/07/2022

## 2022-08-07 NOTE — Patient Instructions (Signed)
Labs done today. We will contact you only if your labs are abnormal.  No medication changes were made. Please continue all current medications as prescribed.  Your physician recommends that you schedule a follow-up appointment in: 4 months with Dr. Aundra Dubin  If you have any questions or concerns before your next appointment please send [place] a message through [facility] or call our office at [phone].    TO LEAVE A MESSAGE FOR THE NURSE SELECT OPTION 2, PLEASE LEAVE A MESSAGE INCLUDING: YOUR NAME DATE OF BIRTH CALL BACK NUMBER REASON FOR CALL**this is important as we prioritize the call backs  YOU WILL RECEIVE A CALL BACK THE SAME DAY AS LONG AS YOU CALL BEFORE 4:00 PM   Do the following things EVERYDAY: Weigh yourself in the morning before breakfast. Write it down and keep it in a log. Take your medicines as prescribed Eat low salt foods--Limit salt (sodium) to 2000 mg per day.  Stay as active as you can everyday Limit all fluids for the day to less than 2 liters   At the [facility], you and your health needs are our priority. As part of our continuing mission to provide you with exceptional heart care, we have created designated Provider Care Teams. These Care Teams include your primary Cardiologist (physician) and Advanced Practice Providers (APPs- Physician Assistants and Nurse Practitioners) who all work together to provide you with the care you need, when you need it.   You may see any of the following providers on your designated Care Team at your next follow up: Dr Glori Bickers Dr Haynes Kerns, NP Lyda Jester, [place] Audry Riles, PharmD   Please be sure to bring in all your medications bottles to every appointment.

## 2022-08-16 DIAGNOSIS — E785 Hyperlipidemia, unspecified: Secondary | ICD-10-CM | POA: Diagnosis not present

## 2022-08-16 DIAGNOSIS — E669 Obesity, unspecified: Secondary | ICD-10-CM | POA: Diagnosis not present

## 2022-08-16 DIAGNOSIS — Z6836 Body mass index (BMI) 36.0-36.9, adult: Secondary | ICD-10-CM | POA: Diagnosis not present

## 2022-08-16 DIAGNOSIS — Z9181 History of falling: Secondary | ICD-10-CM | POA: Diagnosis not present

## 2022-08-16 DIAGNOSIS — Z1331 Encounter for screening for depression: Secondary | ICD-10-CM | POA: Diagnosis not present

## 2022-08-16 DIAGNOSIS — Z139 Encounter for screening, unspecified: Secondary | ICD-10-CM | POA: Diagnosis not present

## 2022-08-16 DIAGNOSIS — Z0000 Encounter for general adult medical examination without abnormal findings: Secondary | ICD-10-CM | POA: Diagnosis not present

## 2022-09-05 ENCOUNTER — Other Ambulatory Visit (HOSPITAL_COMMUNITY): Payer: Self-pay | Admitting: Cardiology

## 2022-09-16 ENCOUNTER — Other Ambulatory Visit (HOSPITAL_COMMUNITY): Payer: Self-pay | Admitting: Adult Health

## 2022-09-16 ENCOUNTER — Other Ambulatory Visit (HOSPITAL_COMMUNITY): Payer: Self-pay | Admitting: Cardiology

## 2022-11-18 DIAGNOSIS — I10 Essential (primary) hypertension: Secondary | ICD-10-CM | POA: Diagnosis not present

## 2022-11-18 DIAGNOSIS — E785 Hyperlipidemia, unspecified: Secondary | ICD-10-CM | POA: Diagnosis not present

## 2022-11-23 ENCOUNTER — Other Ambulatory Visit (HOSPITAL_COMMUNITY): Payer: Self-pay | Admitting: Cardiology

## 2022-11-23 DIAGNOSIS — G4733 Obstructive sleep apnea (adult) (pediatric): Secondary | ICD-10-CM | POA: Diagnosis not present

## 2022-11-29 ENCOUNTER — Ambulatory Visit (HOSPITAL_COMMUNITY): Payer: Medicare PPO | Attending: Cardiology

## 2022-11-29 DIAGNOSIS — I255 Ischemic cardiomyopathy: Secondary | ICD-10-CM | POA: Diagnosis not present

## 2022-11-29 DIAGNOSIS — E782 Mixed hyperlipidemia: Secondary | ICD-10-CM | POA: Diagnosis not present

## 2022-11-29 DIAGNOSIS — I2102 ST elevation (STEMI) myocardial infarction involving left anterior descending coronary artery: Secondary | ICD-10-CM | POA: Diagnosis not present

## 2022-11-29 DIAGNOSIS — G4733 Obstructive sleep apnea (adult) (pediatric): Secondary | ICD-10-CM | POA: Diagnosis not present

## 2022-11-29 LAB — ECHOCARDIOGRAM COMPLETE
Area-P 1/2: 1.73 cm2
S' Lateral: 3.1 cm

## 2022-11-29 MED ORDER — PERFLUTREN LIPID MICROSPHERE
1.0000 mL | INTRAVENOUS | Status: AC | PRN
  Administered 2022-11-29 (×2): 2 mL via INTRAVENOUS

## 2022-11-30 ENCOUNTER — Telehealth: Payer: Self-pay | Admitting: *Deleted

## 2022-11-30 DIAGNOSIS — I5022 Chronic systolic (congestive) heart failure: Secondary | ICD-10-CM

## 2022-11-30 DIAGNOSIS — I255 Ischemic cardiomyopathy: Secondary | ICD-10-CM

## 2022-11-30 NOTE — Telephone Encounter (Signed)
-----  Message from Lorretta Harp, MD sent at 11/29/2022  2:34 PM EST ----- Change in EF.  Please repeat with Definity to further characterize the apex.

## 2022-11-30 NOTE — Telephone Encounter (Signed)
Spoke with pt wife, aware he will need a repeat study. Order placed for limited echo.

## 2022-12-07 ENCOUNTER — Telehealth: Payer: Self-pay | Admitting: *Deleted

## 2022-12-07 DIAGNOSIS — I513 Intracardiac thrombosis, not elsewhere classified: Secondary | ICD-10-CM

## 2022-12-07 NOTE — Telephone Encounter (Signed)
-----  Message from Lorretta Harp, MD sent at 12/07/2022  4:00 PM EST ----- Cardiac MRI to R/O apical mural thrombus ----- Message ----- From: Cristopher Estimable, RN Sent: 12/07/2022  12:36 PM EST To: Lorretta Harp, MD  Lorriane Shire said they gave this patient definity with the last echo. Is there something you want to do besides another echo.

## 2022-12-07 NOTE — Addendum Note (Signed)
Addended by: Beatrix Fetters on: 12/07/2022 04:25 PM   Modules accepted: Orders

## 2022-12-07 NOTE — Telephone Encounter (Signed)
Spoke with pt wife, aware he will get cardiac MRI. Order placed

## 2022-12-21 DIAGNOSIS — G4733 Obstructive sleep apnea (adult) (pediatric): Secondary | ICD-10-CM | POA: Diagnosis not present

## 2022-12-21 DIAGNOSIS — I10 Essential (primary) hypertension: Secondary | ICD-10-CM | POA: Diagnosis not present

## 2022-12-21 DIAGNOSIS — N529 Male erectile dysfunction, unspecified: Secondary | ICD-10-CM | POA: Diagnosis not present

## 2022-12-21 DIAGNOSIS — Z79899 Other long term (current) drug therapy: Secondary | ICD-10-CM | POA: Diagnosis not present

## 2022-12-21 DIAGNOSIS — I5022 Chronic systolic (congestive) heart failure: Secondary | ICD-10-CM | POA: Diagnosis not present

## 2022-12-21 DIAGNOSIS — I2510 Atherosclerotic heart disease of native coronary artery without angina pectoris: Secondary | ICD-10-CM | POA: Diagnosis not present

## 2022-12-21 DIAGNOSIS — M109 Gout, unspecified: Secondary | ICD-10-CM | POA: Diagnosis not present

## 2022-12-21 DIAGNOSIS — E349 Endocrine disorder, unspecified: Secondary | ICD-10-CM | POA: Diagnosis not present

## 2022-12-21 DIAGNOSIS — R7303 Prediabetes: Secondary | ICD-10-CM | POA: Diagnosis not present

## 2022-12-24 DIAGNOSIS — G4733 Obstructive sleep apnea (adult) (pediatric): Secondary | ICD-10-CM | POA: Diagnosis not present

## 2022-12-30 DIAGNOSIS — G4733 Obstructive sleep apnea (adult) (pediatric): Secondary | ICD-10-CM | POA: Diagnosis not present

## 2023-01-22 DIAGNOSIS — G4733 Obstructive sleep apnea (adult) (pediatric): Secondary | ICD-10-CM | POA: Diagnosis not present

## 2023-01-28 DIAGNOSIS — G4733 Obstructive sleep apnea (adult) (pediatric): Secondary | ICD-10-CM | POA: Diagnosis not present

## 2023-01-31 ENCOUNTER — Other Ambulatory Visit (HOSPITAL_COMMUNITY): Payer: Self-pay

## 2023-01-31 ENCOUNTER — Other Ambulatory Visit (HOSPITAL_COMMUNITY): Payer: Self-pay | Admitting: Cardiology

## 2023-01-31 MED ORDER — ENTRESTO 97-103 MG PO TABS: 1.0000 | ORAL_TABLET | Freq: Two times a day (BID) | ORAL | 3 refills | Status: DC

## 2023-02-22 DIAGNOSIS — G4733 Obstructive sleep apnea (adult) (pediatric): Secondary | ICD-10-CM | POA: Diagnosis not present

## 2023-03-07 ENCOUNTER — Telehealth (HOSPITAL_COMMUNITY): Payer: Self-pay

## 2023-03-07 NOTE — Telephone Encounter (Signed)
Spoke to the patients wife Bonita Quin ( on [place]), pt is scheduled to have an MRI on 03/11/23 and will like to know the location. Explained the time and location, patients voiced understanding .

## 2023-03-07 NOTE — Telephone Encounter (Signed)
Patient's wife called about his MRI this week. They do not know where to go or any instructions. Can you follow up with them?

## 2023-03-08 ENCOUNTER — Telehealth (HOSPITAL_COMMUNITY): Payer: Self-pay | Admitting: Emergency Medicine

## 2023-03-08 NOTE — Telephone Encounter (Signed)
Reaching out to patient to offer assistance regarding upcoming cardiac imaging study; pt verbalizes understanding of appt date/time, parking situation and where to check in, pre-test NPO status and medications ordered, and verified current allergies; name and call back number provided for further questions should they arise Rockwell Alexandria RN Navigator Cardiac Imaging [facility] [phone] office [phone] cell  Denies metal other than shoulder replacement Denies claustro Denies iv issues

## 2023-03-11 ENCOUNTER — Other Ambulatory Visit: Payer: Self-pay | Admitting: Cardiovascular Disease

## 2023-03-11 ENCOUNTER — Ambulatory Visit (HOSPITAL_COMMUNITY)
Admission: RE | Admit: 2023-03-11 | Discharge: 2023-03-11 | Disposition: A | Discharge status: Home / Self Care | Payer: Medicare PPO | Source: Ambulatory Visit | Attending: Cardiovascular Disease | Admitting: Cardiovascular Disease

## 2023-03-11 DIAGNOSIS — I513 Intracardiac thrombosis, not elsewhere classified: Secondary | ICD-10-CM | POA: Diagnosis not present

## 2023-03-11 MED ORDER — GADOBUTROL 1 MMOL/ML IV SOLN
10.0000 mL | Freq: Once | INTRAVENOUS | Status: AC | PRN
  Administered 2023-03-11: 10 mL via INTRAVENOUS

## 2023-03-26 ENCOUNTER — Other Ambulatory Visit (HOSPITAL_COMMUNITY): Payer: Self-pay | Admitting: Cardiology

## 2023-04-23 ENCOUNTER — Ambulatory Visit: Payer: Medicare PPO | Attending: Cardiovascular Disease | Admitting: Cardiovascular Disease

## 2023-04-24 ENCOUNTER — Telehealth: Payer: Self-pay | Admitting: Cardiovascular Disease

## 2023-04-24 NOTE — Telephone Encounter (Signed)
Spoke with patient wife per DPR and gave her results of Cardiac Morphology. She verbalized understanding. She also asked about surgery clearance. Informed her once we receive clearance form the pre-op team will reach out to them.

## 2023-04-24 NOTE — Telephone Encounter (Signed)
  Per MyChart scheduling message:  Patient sent a message through MyChart asking to discuss MRI results that he had done in April.

## 2023-04-27 ENCOUNTER — Other Ambulatory Visit (HOSPITAL_COMMUNITY): Payer: Self-pay | Admitting: Family Medicine

## 2023-04-30 DIAGNOSIS — G4733 Obstructive sleep apnea (adult) (pediatric): Secondary | ICD-10-CM | POA: Diagnosis not present

## 2023-05-30 DIAGNOSIS — G4733 Obstructive sleep apnea (adult) (pediatric): Secondary | ICD-10-CM | POA: Diagnosis not present

## 2023-06-02 ENCOUNTER — Other Ambulatory Visit: Payer: Self-pay | Admitting: Cardiology

## 2023-06-05 DIAGNOSIS — M1612 Unilateral primary osteoarthritis, left hip: Secondary | ICD-10-CM | POA: Diagnosis not present

## 2023-06-05 DIAGNOSIS — M25552 Pain in left hip: Secondary | ICD-10-CM | POA: Diagnosis not present

## 2023-06-05 DIAGNOSIS — Z96641 Presence of right artificial hip joint: Secondary | ICD-10-CM | POA: Diagnosis not present

## 2023-06-11 ENCOUNTER — Telehealth: Payer: Self-pay | Admitting: *Deleted

## 2023-06-11 NOTE — Telephone Encounter (Signed)
   Pre-operative Risk Assessment    Patient Name: Walter Anderson  DOB: 18-Jan-1946 MRN: [id number]      Request for Surgical Clearance    Procedure:   LEFT TOTAL HIP ARTHROPLASTY  Date of Surgery:  Clearance 08/06/23                                 Surgeon:  DR. MATTHEW OLIN Surgeon's Group or Practice Name:  [facility] Phone number:  [phone] ATTN: Rosalva Ferron Fax number:  [phone]   Type of Clearance Requested:   - Medical ; ASA AND BRILINTA   Type of Anesthesia:  Spinal   Additional requests/questions:    Elpidio Anis   06/11/2023, 8:58 AM

## 2023-06-11 NOTE — Telephone Encounter (Signed)
   Name: Walter Anderson  DOB: 28-Jun-1946  MRN: [id number]  Primary Cardiologist: Nanetta Batty, MD  Chart reviewed as part of pre-operative protocol coverage. Because of Walter Anderson's past medical history and time since last visit, he will require a follow-up in-office visit in order to better assess preoperative cardiovascular risk.  Pre-op covering staff: - Please schedule appointment and call patient to inform them. If patient already had an upcoming appointment within acceptable timeframe, please add "pre-op clearance" to the appointment notes so provider is aware. - Please contact requesting surgeon's office via preferred method (i.e, phone, fax) to inform them of need for appointment prior to surgery.  This message will also be routed to Dr Allyson Sabal for input on holding Aspirin and Brilinta as requested below so that this information is available to the clearing provider at time of patient's appointment.   Joylene Grapes, NP  06/11/2023, 11:20 AM

## 2023-06-13 NOTE — Telephone Encounter (Signed)
Pt has appt in office 07/12/23 with Joni Reining, DNP @ 3:10. I will update all parties involved.

## 2023-06-13 NOTE — Telephone Encounter (Signed)
   Name: Walter Anderson  DOB: 1946/09/27  MRN: [id number]  Primary Cardiologist: Nanetta Batty, MD   Preoperative team, please contact this patient and set up a phone call appointment for further preoperative risk assessment. Please obtain consent and complete medication review. Thank you for your help.  I confirm that guidance regarding antiplatelet and oral anticoagulation therapy has been completed and, if necessary, noted below.  Per Dr. Allyson Sabal:  "It's been close to 2 years since his intervention. Okay to hold Brilinta and ASA for procedure."   Joni Reining, NP 06/13/2023, 7:03 AM [facility]

## 2023-06-13 NOTE — Telephone Encounter (Signed)
Wife returned Pre-op call.  Appointment scheduled 8/23.

## 2023-06-13 NOTE — Telephone Encounter (Signed)
1st attempt to reach pt regarding surgical clearance and the need for an IN OFFICE appointment.  Left pt a detailed message to call back and get that scheduled.

## 2023-06-21 ENCOUNTER — Other Ambulatory Visit (HOSPITAL_COMMUNITY): Payer: Self-pay | Admitting: Adult Health

## 2023-06-21 ENCOUNTER — Other Ambulatory Visit (HOSPITAL_COMMUNITY): Payer: Self-pay | Admitting: Cardiology

## 2023-06-25 DIAGNOSIS — G4733 Obstructive sleep apnea (adult) (pediatric): Secondary | ICD-10-CM | POA: Diagnosis not present

## 2023-06-25 DIAGNOSIS — M1612 Unilateral primary osteoarthritis, left hip: Secondary | ICD-10-CM | POA: Diagnosis not present

## 2023-06-25 DIAGNOSIS — I2510 Atherosclerotic heart disease of native coronary artery without angina pectoris: Secondary | ICD-10-CM | POA: Diagnosis not present

## 2023-06-25 DIAGNOSIS — I10 Essential (primary) hypertension: Secondary | ICD-10-CM | POA: Diagnosis not present

## 2023-06-25 DIAGNOSIS — Z79899 Other long term (current) drug therapy: Secondary | ICD-10-CM | POA: Diagnosis not present

## 2023-06-25 DIAGNOSIS — I5022 Chronic systolic (congestive) heart failure: Secondary | ICD-10-CM | POA: Diagnosis not present

## 2023-06-25 DIAGNOSIS — M109 Gout, unspecified: Secondary | ICD-10-CM | POA: Diagnosis not present

## 2023-06-25 DIAGNOSIS — E349 Endocrine disorder, unspecified: Secondary | ICD-10-CM | POA: Diagnosis not present

## 2023-06-25 DIAGNOSIS — R7303 Prediabetes: Secondary | ICD-10-CM | POA: Diagnosis not present

## 2023-06-30 DIAGNOSIS — G4733 Obstructive sleep apnea (adult) (pediatric): Secondary | ICD-10-CM | POA: Diagnosis not present

## 2023-07-08 ENCOUNTER — Other Ambulatory Visit: Payer: Self-pay | Admitting: Cardiology

## 2023-07-11 NOTE — Progress Notes (Deleted)
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 On DAPT with ASA and Brilinta for at least one year.    Past Medical History    Past Medical History:  Diagnosis Date   Allergy    Arthritis    Asthma    Hypertension    Sleep apnea    uses CPAP   Past Surgical History:  Procedure Laterality Date   BACK SURGERY  2006   CHOLECYSTECTOMY N/A 11/15/2015   Procedure: LAPAROSCOPIC CHOLECYSTECTOMY WITH INTRAOPERATIVE CHOLANGIOGRAM;  Surgeon: Kieth Brightly, MD;  Location: [facility];  Service: General;  Laterality: N/A;   CORONARY/GRAFT ACUTE MI REVASCULARIZATION N/A 09/11/2021   Procedure: Coronary/Graft Acute MI Revascularization;  Surgeon: Runell Gess, MD;  Location: [facility];  Service: Cardiovascular;  Laterality: N/A;   FINGER ARTHROSCOPY  1990   3rd Finger Left Hand   LEFT HEART CATH AND CORONARY ANGIOGRAPHY N/A 09/11/2021   Procedure: LEFT HEART CATH AND CORONARY ANGIOGRAPHY;  Surgeon: Runell Gess, MD;  Location: [facility];  Service: Cardiovascular;  Laterality: N/A;   ROTATOR CUFF REPAIR     right shoulder   TONSILLECTOMY  77 years old   TOTAL HIP ARTHROPLASTY Right 04/01/2018   Procedure: RIGHT TOTAL HIP ARTHROPLASTY ANTERIOR APPROACH;  Surgeon: Durene Romans, MD;  Location: [facility];  Service: Orthopedics;  Laterality: Right;  70 mins   UMBILICAL HERNIA REPAIR  11/15/2015   Procedure: HERNIA REPAIR UMBILICAL ADULT;  Surgeon: Kieth Brightly, MD;  Location: [facility];  Service: General;;    Allergies  No Known Allergies  History of Present Illness    Walter Anderson comes to the office today for preoperative cardiac evaluation for left  total hip arthroplasty per Dr. Barbette Or with [facility], date of surgery planned for 08/06/2023, and recommendations concerning aspirin and Brilinta cessation perioperatively.  Home Medications    Current Outpatient Medications  Medication Sig Dispense Refill   acetaminophen (TYLENOL) 500 MG tablet Take 2,000 mg by mouth every 6 (six) hours as needed for pain.     allopurinol (ZYLOPRIM) 300 MG tablet Take 300 mg by mouth daily as needed.     Ascorbic Acid (VITAMIN C PO) Take 1 tablet by mouth daily.     aspirin 81 MG EC tablet Take 81 mg by mouth daily.     atorvastatin (LIPITOR) 80 MG tablet TAKE 1 TABLET BY MOUTH EVERY DAY 90 tablet 3   BRILINTA 90 MG TABS tablet TAKE 1 TABLET BY MOUTH TWICE A DAY 60 tablet 0   carvedilol (COREG) 6.25 MG tablet TAKE 1 TABLET (6.25 MG TOTAL) BY MOUTH 2 (TWO) TIMES DAILY WITH A MEAL. NEEDS FOLLOW UP APPOINTMENT FOR MORE REFILLS 60 tablet 0   Cholecalciferol (VITAMIN D3 PO) Take 250 mg by mouth daily.     Coenzyme Q10 400 MG CAPS Take 400 mg by mouth daily.      docusate sodium (COLACE) 100 MG capsule Take 100 mg by mouth as needed for mild constipation.     empagliflozin (JARDIANCE) 10 MG TABS tablet TAKE 1 TABLET BY MOUTH EVERY DAY BEFORE BREAKFAST 90 tablet 0   furosemide (LASIX) 40 MG tablet TAKE  1 TABLET BY MOUTH DAILY AS NEEDED FOR FLUID OR EDEMA. 90 tablet 1   loratadine (CLARITIN) 10 MG tablet Take 10 mg by mouth daily as needed for allergies.     magnesium gluconate (MAGONATE) 500 MG tablet Take 500 mg by mouth daily.     nitroGLYCERIN (NITROSTAT) 0.4 MG SL tablet Place 1 tablet (0.4 mg total) under the tongue every 5 (five) minutes x 3 doses as needed for chest pain. 25 tablet 2   Omega-3 Fatty Acids (OMEGA 3 PO) Take 1 capsule by mouth daily.     sacubitril-valsartan (ENTRESTO) 97-103 MG Take 1 tablet by mouth 2 (two) times daily. 180 tablet 3   spironolactone (ALDACTONE) 25 MG tablet Take 1 tablet (25 mg total) by mouth daily. NEEDS FOLLOW UP  APPOINTMENT FOR MORE REFILLS 90 tablet 0   Testosterone 20.25 MG/ACT (1.62%) GEL Apply 1 application topically daily.     No current facility-administered medications for this visit.     Family History    Family History  Problem Relation Age of Onset   Diabetes Mother    COPD Father    Heart disease Father    He indicated that his mother is deceased. He indicated that his father is deceased.  Social History    Social History   Socioeconomic History   Marital status: Married    Spouse name: Eleazer Barquero   Number of children: 2   Years of education: Not on file   Highest education level: Associate degree: occupational, Scientist, product/process development, or vocational program  Occupational History   Occupation: heavy Media planner  Tobacco Use   Smoking status: Never   Smokeless tobacco: Former    Types: Chew    Quit date: 11/15/2005  Vaping Use   Vaping status: Never Used  Substance and Sexual Activity   Alcohol use: Yes    Alcohol/week: 4.0 standard drinks of alcohol    Types: 4 Shots of liquor per week    Comment: 1-2 Weekly   Drug use: No   Sexual activity: Not Currently  Other Topics Concern   Not on file  Social History Narrative   Not on file   Social Determinants of Health   Financial Resource Strain: Low Risk  (09/13/2021)   Overall Financial Resource Strain (CARDIA)    Difficulty of Paying Living Expenses: Not very hard  Food Insecurity: No Food Insecurity (09/13/2021)   Hunger Vital Sign    Worried About Running Out of Food in the Last Year: Never true    Ran Out of Food in the Last Year: Never true  Transportation Needs: No Transportation Needs (09/13/2021)   PRAPARE - Administrator, Civil Service (Medical): No    Lack of Transportation (Non-Medical): No  Physical Activity: Not on file  Stress: Not on file  Social Connections: Not on file  Intimate Partner Violence: Not on file     Review of Systems    General:  No chills, fever, night sweats or  weight changes.  Cardiovascular:  No chest pain, dyspnea on exertion, edema, orthopnea, palpitations, paroxysmal nocturnal dyspnea. Dermatological: No rash, lesions/masses Respiratory: No cough, dyspnea Urologic: No hematuria, dysuria Abdominal:   No nausea, vomiting, diarrhea, bright red blood per rectum, melena, or hematemesis Neurologic:  No visual changes, wkns, changes in mental status. All other systems reviewed and are otherwise negative except as noted above.       Physical Exam    VS:  There were no vitals taken for this  visit. , BMI There is no height or weight on file to calculate BMI.     GEN: Well nourished, well developed, in no acute distress. HEENT: normal. Neck: Supple, no JVD, carotid bruits, or masses. Cardiac: RRR, no murmurs, rubs, or gallops. No clubbing, cyanosis, edema.  Radials/DP/PT 2+ and equal bilaterally.  Respiratory:  Respirations regular and unlabored, clear to auscultation bilaterally. GI: Soft, nontender, nondistended, BS + x 4. MS: no deformity or atrophy. Skin: warm and dry, no rash. Neuro:  Strength and sensation are intact. Psych: Normal affect.      Lab Results  Component Value Date   WBC 7.6 08/07/2022   HGB 13.6 08/07/2022   HCT 43.1 08/07/2022   MCV 99.3 08/07/2022   PLT 150 08/07/2022   Lab Results  Component Value Date   CREATININE 1.05 08/07/2022   BUN 19 08/07/2022   NA 140 08/07/2022   K 4.5 08/07/2022   CL 107 08/07/2022   CO2 22 08/07/2022   Lab Results  Component Value Date   ALT 20 09/11/2021   AST 18 09/11/2021   ALKPHOS 71 09/11/2021   BILITOT 0.7 09/11/2021   Lab Results  Component Value Date   CHOL 100 01/02/2022   HDL 30 (L) 01/02/2022   LDLCALC 57 01/02/2022   TRIG 65 01/02/2022   CHOLHDL 3.3 01/02/2022    Lab Results  Component Value Date   HGBA1C 5.5 09/11/2021     Review of Prior Studies      Assessment & Plan   1.  Pre-Operative Cardiac Clearance: According to the Revised Cardiac Risk  Index (RCRI), his    His   according to the [facility] Activity Status Index (DASI).   Per Dr. Allyson Sabal: "It's been close to 2 years since his intervention. Okay to hold Brilinta and ASA for procedure."        Signed, Bettey Mare. Liborio Nixon, ANP, AACC   07/11/2023 1:46 PM      Office [phone] Fax [phone]  Notice: This dictation was prepared with Dragon dictation along with smaller phrase technology. Any transcriptional errors that result from this process are unintentional and may not be corrected upon review.

## 2023-07-12 ENCOUNTER — Ambulatory Visit: Payer: Medicare PPO | Admitting: Adult Health

## 2023-07-12 ENCOUNTER — Encounter: Payer: Self-pay | Admitting: Adult Health

## 2023-07-15 NOTE — Progress Notes (Unsigned)
Cardiology Clinic Note   Patient Name: Walter Anderson Date of Encounter: 07/18/2023  Primary Care Provider:  Lonie Peak, PA-C Primary Cardiologist:  Nanetta Batty, MD  Patient Profile    Walter Anderson 77 year old male presents the clinic today for follow-up evaluation of his chronic systolic CHF.  Past Medical History    Past Medical History:  Diagnosis Date   Allergy    Arthritis    Asthma    Hypertension    Sleep apnea    uses CPAP   Past Surgical History:  Procedure Laterality Date   BACK SURGERY  2006   CHOLECYSTECTOMY N/A 11/15/2015   Procedure: LAPAROSCOPIC CHOLECYSTECTOMY WITH INTRAOPERATIVE CHOLANGIOGRAM;  Surgeon: Kieth Brightly, MD;  Location: [facility];  Service: General;  Laterality: N/A;   CORONARY/GRAFT ACUTE MI REVASCULARIZATION N/A 09/11/2021   Procedure: Coronary/Graft Acute MI Revascularization;  Surgeon: Runell Gess, MD;  Location: [facility];  Service: Cardiovascular;  Laterality: N/A;   FINGER ARTHROSCOPY  1990   3rd Finger Left Hand   LEFT HEART CATH AND CORONARY ANGIOGRAPHY N/A 09/11/2021   Procedure: LEFT HEART CATH AND CORONARY ANGIOGRAPHY;  Surgeon: Runell Gess, MD;  Location: [facility];  Service: Cardiovascular;  Laterality: N/A;   ROTATOR CUFF REPAIR     right shoulder   TONSILLECTOMY  76 years old   TOTAL HIP ARTHROPLASTY Right 04/01/2018   Procedure: RIGHT TOTAL HIP ARTHROPLASTY ANTERIOR APPROACH;  Surgeon: Durene Romans, MD;  Location: [facility];  Service: Orthopedics;  Laterality: Right;  70 mins   UMBILICAL HERNIA REPAIR  11/15/2015   Procedure: HERNIA REPAIR UMBILICAL ADULT;  Surgeon: Kieth Brightly, MD;  Location: [facility];  Service: General;;    Allergies  No Known Allergies  History of Present Illness    Walter Anderson has a PMH of HTN, asthma, hip replacement 2018, OSA on CPAP, coronary artery disease status post STEMI with DES 09/11/2021, ischemic cardiomyopathy, and chronic systolic  CHF.  He presented with STEMI 09/11/2021.  He underwent cardiac catheterization and received DES x1 to his mid LAD.  His follow-up echocardiogram showed an LVEF of 30-35% which was down from previous echocardiogram showing 55-60%.  He was placed on aspirin and Brilinta and started on Jardiance.  He followed up with the advanced heart failure clinic 09/21/2021.  During that time he reported that he was feeling okay.  He complained of fatigue.  He did note shortness of breath with ambulation.  He denied orthopnea and PND.  Reported that he slept with elevated head of bed chronically.  He denied chest pain and bleeding issues.  He reported following a low-sodium diet.  His weight had trended down from 288 to 281 pounds.  He reported compliance with his CPAP and medications.  He was continued on Entresto 24-26, spironolactone 12.5 mg daily, Jardiance 10 mg daily was added to his medication regimen and repeat echocardiogram was planned for 3 months.  He presented to the clinic 09/27/2021 for follow-up evaluation stated he felt well.  He was tolerating his medications without side effects and reported compliance.  We reviewed his angiography and he and his wife expressed understanding.  His blood pressure was 110/64.  His right radial cath site is clean dry intact and has no signs of infection.  He had been slowly increasing his physical activity and reported that he was ready to start rabbit hunting without his gun.  At the time we paused on up titration of his  medication due to his recent titration. I planned to see him back in 1 month to further increase his medication.  I gave him a salty 6 diet sheet.  His EKG showed normal sinus rhythm inferior infarct undetermined age, anterolateral infarct undetermined age 37 bpm.  We also reviewed his previous abdomen/pelvis CT which shows a atrophied scarred left kidney.  He presented  to the clinic 11/02/21 for follow-up evaluation stated he felt well.  He reported that  he and his wife both had a respiratory illness over Thanksgiving.  His wife continued to recover.  He was less active during that time.  He was happy that he continued to lose weight.  He was 270 pounds.   He had been eating about 2 meals per day.  He was planning on getting out with his dogs to walk his property.  We discussed erectile dysfunction as well.  I reviewed his medications and instructed him that when he is able to walk up 2 flights of stairs he will be healthy enough to resume sexual activity.  I elected to hold off on prescribing extra medication at this time.  We discussed repeating his echocardiogram in 2 months to see about his left ventricular ejection fraction.   Plan for follow-up in 3 to 4 months.  He followed up with Dr. Allyson Sabal and was last seen by Prince Rome NP on 08/07/2022.  During that time he continued to feel okay.  He remained fatigued since his MI.  He reported dyspnea when pushing himself to work in his garden.  He denied palpitations, bleeding, chest pain, and lower extremity swelling.  His weight was 260 pounds.  He was taking Lasix every week.  Echocardiogram 2/23 showed an EF of 40-45%, periapical hypokinesis and normal RV function.  Follow-up was planned for 4 months.  He presents to the clinic today for follow-up evaluation and states he had cardiac MRI on 03/11/2023.  It noted an EF of 42%, apical scarring and no evidence of blood clot in the left ventricle.  We reviewed this test and he and his wife expressed understanding.  He was recently seen by his PCP and noted to have hypotension.  His Entresto was reduced to half and his carvedilol was also cut in half.  He continues to use furosemide as needed.  His weight today is 271 pounds.  He is well compensated.  We reviewed the importance of low-sodium diet.  He has remained somewhat physically active and enjoys working in his garden as well as running his dogs.  He has upcoming hip surgery planned.  I will send a note to  orthopedics and plan follow-up in 4 to 6 months.  Today he denies chest pain, shortness of breath, lower extremity edema, fatigue, palpitations, melena, hematuria, hemoptysis, diaphoresis, weakness, presyncope, syncope, orthopnea, and PND.   Home Medications    Prior to Admission medications   Medication Sig Start Date End Date Taking? Authorizing Provider  acetaminophen (TYLENOL) 500 MG tablet Take 2,000 mg by mouth every 6 (six) hours as needed for pain.    [provider]  allopurinol (ZYLOPRIM) 300 MG tablet Take 300 mg by mouth daily as needed. 07/06/21   [provider]  amLODipine (NORVASC) 2.5 MG tablet Take 2.5 mg by mouth daily.    [provider]  Ascorbic Acid (VITAMIN C PO) Take 1 tablet by mouth daily.    [provider]  aspirin 81 MG EC tablet Take 81 mg by mouth daily.  [provider]  atorvastatin (LIPITOR) 80 MG tablet Take 1 tablet (80 mg total) by mouth daily. 09/14/21   Arty Baumgartner, NP  carvedilol (COREG) 3.125 MG tablet Take 1 tablet (3.125 mg total) by mouth 2 (two) times daily with a meal. 09/13/21   Laverda Page B, NP  Coenzyme Q10 400 MG CAPS Take 400 mg by mouth daily.     [provider]  docusate sodium (COLACE) 100 MG capsule Take 1 capsule (100 mg total) by mouth 2 (two) times daily. Patient taking differently: Take 100 mg by mouth daily as needed for mild constipation. 04/01/18   Lanney Gins, PA-C  doxazosin (CARDURA) 2 MG tablet Take 1 tablet (2 mg total) by mouth 2 (two) times daily. 09/21/21   Clegg, Amy D, NP  empagliflozin (JARDIANCE) 10 MG TABS tablet Take 1 tablet (10 mg total) by mouth daily before breakfast. 09/21/21   Clegg, Amy D, NP  furosemide (LASIX) 40 MG tablet Take 1 tablet (40 mg total) by mouth daily as needed for fluid or edema. 09/21/21 09/21/22  Clegg, Amy D, NP  loratadine (CLARITIN) 10 MG tablet Take 10 mg by mouth daily as needed for allergies.    [provider]   magnesium gluconate (MAGONATE) 500 MG tablet Take 500 mg by mouth daily.    [provider]  nitroGLYCERIN (NITROSTAT) 0.4 MG SL tablet Place 1 tablet (0.4 mg total) under the tongue every 5 (five) minutes x 3 doses as needed for chest pain. 09/13/21   Arty Baumgartner, NP  Omega-3 Fatty Acids (OMEGA 3 PO) Take 1 capsule by mouth daily.    [provider]  sacubitril-valsartan (ENTRESTO) 24-26 MG Take 1 tablet by mouth 2 (two) times daily. 09/13/21   Arty Baumgartner, NP  spironolactone (ALDACTONE) 25 MG tablet Take 0.5 tablets (12.5 mg total) by mouth daily. 09/14/21   Runell Gess, MD  Testosterone 20.25 MG/ACT (1.62%) GEL Apply 1 application topically daily. 09/08/21   [provider]  ticagrelor (BRILINTA) 90 MG TABS tablet Take 1 tablet (90 mg total) by mouth 2 (two) times daily. 09/13/21   Arty Baumgartner, NP    Family History    Family History  Problem Relation Age of Onset   Diabetes Mother    COPD Father    Heart disease Father    He indicated that his mother is deceased. He indicated that his father is deceased.   Social History    Social History   Socioeconomic History   Marital status: Married    Spouse name: Xaylen Bredehoft   Number of children: 2   Years of education: Not on file   Highest education level: Associate degree: occupational, Scientist, product/process development, or vocational program  Occupational History   Occupation: heavy Media planner  Tobacco Use   Smoking status: Never   Smokeless tobacco: Former    Types: Chew    Quit date: 11/15/2005  Vaping Use   Vaping status: Never Used  Substance and Sexual Activity   Alcohol use: Yes    Alcohol/week: 4.0 standard drinks of alcohol    Types: 4 Shots of liquor per week    Comment: 1-2 Weekly   Drug use: No   Sexual activity: Not Currently  Other Topics Concern   Not on file  Social History Narrative   Not on file   Social Determinants of Health   Financial Resource Strain: Low  Risk  (09/13/2021)   Overall Financial Resource Strain (CARDIA)  Difficulty of Paying Living Expenses: Not very hard  Food Insecurity: No Food Insecurity (09/13/2021)   Hunger Vital Sign    Worried About Running Out of Food in the Last Year: Never true    Ran Out of Food in the Last Year: Never true  Transportation Needs: No Transportation Needs (09/13/2021)   PRAPARE - Administrator, Civil Service (Medical): No    Lack of Transportation (Non-Medical): No  Physical Activity: Not on file  Stress: Not on file  Social Connections: Not on file  Intimate Partner Violence: Not on file     Review of Systems    General:  No chills, fever, night sweats or weight changes.  Cardiovascular:  No chest pain, dyspnea on exertion, edema, orthopnea, palpitations, paroxysmal nocturnal dyspnea. Dermatological: No rash, lesions/masses Respiratory: No cough, dyspnea Urologic: No hematuria, dysuria Abdominal:   No nausea, vomiting, diarrhea, bright red blood per rectum, melena, or hematemesis Neurologic:  No visual changes, wkns, changes in mental status. All other systems reviewed and are otherwise negative except as noted above.  Physical Exam    VS:  BP 108/62   Pulse 64   Ht 6' (1.829 m)   Wt 271 lb 6.4 oz (123.1 kg)   SpO2 96%   BMI 36.81 kg/m  , BMI Body mass index is 36.81 kg/m. GEN: Well nourished, well developed, in no acute distress. HEENT: normal. Neck: Supple, no JVD, carotid bruits, or masses. Cardiac: RRR, no murmurs, rubs, or gallops. No clubbing, cyanosis, edema.  Radials/DP/PT 2+ and equal bilaterally.  Respiratory:  Respirations regular and unlabored, clear to auscultation bilaterally. GI: Soft, nontender, nondistended, BS + x 4. MS: no deformity or atrophy. Skin: warm and dry, no rash.   Neuro:  Strength and sensation are intact. Psych: Normal affect.  Accessory Clinical Findings    Recent Labs: 08/07/2022: B Natriuretic Peptide 218.6; BUN 19; Creatinine,  Ser 1.05; Hemoglobin 13.6; Platelets 150; Potassium 4.5; Sodium 140; TSH 2.788   Recent Lipid Panel    Component Value Date/Time   CHOL 100 01/02/2022 1126   TRIG 65 01/02/2022 1126   HDL 30 (L) 01/02/2022 1126   CHOLHDL 3.3 01/02/2022 1126   VLDL 13 01/02/2022 1126   LDLCALC 57 01/02/2022 1126    ECG personally reviewed by me today-EKG Interpretation Date/Time:  Thursday July 18 2023 08:35:07 EDT Ventricular Rate:  64 PR Interval:  168 QRS Duration:  90 QT Interval:  412 QTC Calculation: 425 R Axis:   -49  Text Interpretation: Normal sinus rhythm Low voltage QRS Left anterior fascicular block Confirmed by Edd Fabian [phone]) on 07/18/2023 8:49:51 AM   EKG 09/27/2021 normal sinus rhythm left axis deviation inferior infarct undetermined age anterior lateral infarct undetermined age 59 bpm- No acute changes  Cardiac catheterization 09/11/2021   Mid LAD lesion is 100% stenosed.   A drug-eluting stent was successfully placed using a STENT ONYX FRONTIER 3.0X15.   Post intervention, there is a 0% residual stenosis.   Walter Anderson is a 77 y.o. male      [id number] LOCATION:  FACILITY: [facility]  PHYSICIAN: Nanetta Batty, M.D. Dec 19, 1945     DATE OF PROCEDURE:  09/11/2021  Diagnostic Dominance: Left Intervention    Echocardiogram 09/12/2021  IMPRESSIONS     1. Mid septum into apex is akinetic consistent with large LAD infarction.  No LV thrombus on contrast imaging. Left ventricular ejection fraction, by  estimation, is 30 to 35%. The left ventricle has moderately decreased  function.  The left ventricle  demonstrates regional wall motion abnormalities (see scoring  diagram/findings for description). There is mild left ventricular  hypertrophy. Left ventricular diastolic parameters are consistent with  Grade II diastolic dysfunction (pseudonormalization).   2. Right ventricular systolic function is normal. The right ventricular  size is normal. Tricuspid  regurgitation signal is inadequate for assessing  PA pressure.   3. The mitral valve is grossly normal. Trivial mitral valve  regurgitation. No evidence of mitral stenosis.   4. The aortic valve is tricuspid. There is moderate calcification of the  aortic valve. There is mild thickening of the aortic valve. Aortic valve  regurgitation is not visualized. Mild to moderate aortic valve  sclerosis/calcification is present, without  any evidence of aortic stenosis.   5. The inferior vena cava is dilated in size with >50% respiratory  variability, suggesting right atrial pressure of 8 mmHg.   Comparison(s): Changes from prior study are noted. The left ventricular  function is significantly worse. New WMAs consistent with LAD infarction.   Cardiac MRI 03/11/2023  IMPRESSION: 1. Normal LV size with peri-apical wall motion abnormalities as noted above, EF 42%.   2.  Normal RV size and systolic function, EF 50%.   3. Delayed enhancement pattern suggests prior LAD-territory infarction involving primarily the apex and peri-apical segments.   4.  I do not see a definite LV thrombus.   Walter Anderson     Electronically Signed   By: Marca Ancona M.D.   On: 03/11/2023 18:30  Assessment & Plan   1.   Coronary artery disease-no chest pain today and denies recent chest discomfort.  With catheterization 09/11/2021 and received DES x1 to his LAD.  Echocardiogram showed a reduction in his ejection fraction down to 30-35%.  Repeat echocardiogram 20/23 showed EF 40-45%. Continue aspirin, amlodipine, atorvastatin, nitroglycerin Heart healthy low-sodium diet-salty 6 reviewed Increase physical activity as tolerated Discontinue Brilinta  Ischemic cardiomyopathy-breathing stable.  Maintaining weight around 260 pounds. Has remained physically active .  Continue current medical therapy Heart healthy low-sodium diet Increase physical activity as tolerated Follows with advanced heart failure  clinic  Essential hypertension-BP today 1058/62  Maintain blood pressure log Continue  carvedilol 3.125,  Entresto 97-1 03 taking half tablet Continue spironolactone-is noticing some nipple sensitivity. Heart healthy low-sodium diet-salty 6 reviewed Increase physical activity as tolerated  Morbid obesity-weight today 271.  Continues weight loss. Increase physical activity as tolerated-goal 150 minutes of moderate physical activity per week.-Reviewed Continue heart healthy low-sodium diet-reviewed Continue weight loss  Preop- LEFT TOTAL HIP ARTHROPLASTY , DR. Durene Romans [phone]    Primary Cardiologist: Nanetta Batty, MD  Chart reviewed as part of pre-operative protocol coverage. Given past medical history and time since last visit, based on ACC/AHA guidelines, Walter Anderson would be at acceptable risk for the planned procedure without further cardiovascular testing.   His RCRI is a class IV risk, 11% risk of major cardiac event.  He is able to complete greater than 4 METS of physical activity.  Patient was advised that if he develops new symptoms prior to surgery to contact our office to arrange a follow-up appointment.  He verbalized understanding.  Patient no longer taking Brilinta.  I would recommend that he continue to take his aspirin throughout the perioperative period.  I will route this recommendation to the requesting party via Epic fax function and remove from pre-op pool.       Disposition: Follow-up with Dr. Allyson Sabal or me in 4-6 months.  Walter Anderson    07/18/2023, 8:50 AM [facility] [address] Office [phone] Fax [phone]  Notice: This dictation was prepared with Dragon dictation along with smaller phrase technology. Any transcriptional errors that result from this process are unintentional and may not be corrected upon review.  I spent 14 minutes examining this patient, reviewing  medications, and using patient centered shared decision making involving her cardiac care.  Prior to her visit I spent greater than 20 minutes reviewing her past medical history,  medications, and prior cardiac tests.

## 2023-07-18 ENCOUNTER — Encounter: Payer: Self-pay | Admitting: General Practice

## 2023-07-18 ENCOUNTER — Ambulatory Visit: Payer: Medicare PPO | Attending: Adult Health | Admitting: General Practice

## 2023-07-18 VITALS — BP 108/62 | HR 64 | Ht 72.0 in | Wt 271.4 lb

## 2023-07-18 DIAGNOSIS — I255 Ischemic cardiomyopathy: Secondary | ICD-10-CM

## 2023-07-18 DIAGNOSIS — I10 Essential (primary) hypertension: Secondary | ICD-10-CM | POA: Diagnosis not present

## 2023-07-18 DIAGNOSIS — I2510 Atherosclerotic heart disease of native coronary artery without angina pectoris: Secondary | ICD-10-CM | POA: Diagnosis not present

## 2023-07-18 DIAGNOSIS — Z01818 Encounter for other preprocedural examination: Secondary | ICD-10-CM | POA: Diagnosis not present

## 2023-07-18 NOTE — Patient Instructions (Signed)
Medication Instructions:  The current medical regimen is effective;  continue present plan and medications as directed. Please refer to the Current Medication list given to you today.  *If you need a refill on your cardiac medications before your next appointment, please call your pharmacy*  Lab Work: NONE If you have labs (blood work) drawn today and your tests are completely normal, you will receive your results only by:  MyChart Message (if you have MyChart) OR  A paper copy in the mail If you have any lab test that is abnormal or we need to change your treatment, we will call you to review the results.  Other Instructions PLEASE READ AND FOLLOW ATTACHED  SALTY 6  OK FOR SURGERY  Follow-Up: At [facility], you and your health needs are our priority.  As part of our continuing mission to provide you with exceptional heart care, we have created designated Provider Care Teams.  These Care Teams include your primary Cardiologist (physician) and Advanced Practice Providers (APPs -  Physician Assistants and Nurse Practitioners) who all work together to provide you with the care you need, when you need it.  Your next appointment:   4-6 month(s)  Provider:   Nanetta Batty, MD  or Edd Fabian, FNP

## 2023-07-19 ENCOUNTER — Other Ambulatory Visit (HOSPITAL_COMMUNITY): Payer: Self-pay | Admitting: Cardiology

## 2023-07-20 ENCOUNTER — Other Ambulatory Visit (HOSPITAL_COMMUNITY): Payer: Self-pay | Admitting: Family Medicine

## 2023-07-30 DIAGNOSIS — G4733 Obstructive sleep apnea (adult) (pediatric): Secondary | ICD-10-CM | POA: Diagnosis not present

## 2023-07-30 NOTE — Patient Instructions (Signed)
SURGICAL WAITING ROOM VISITATION  Patients having surgery or a procedure may have no more than 2 support people in the waiting area - these visitors may rotate.    Children under the age of 30 must have an adult with them who is not the patient.  Due to an increase in RSV and influenza rates and associated hospitalizations, children ages 27 and under may not visit patients in [facility] hospitals.  If the patient needs to stay at the hospital during part of their recovery, the visitor guidelines for inpatient rooms apply. Pre-op nurse will coordinate an appropriate time for 1 support person to accompany patient in pre-op.  This support person may not rotate.    Please refer to the [facility] website for the visitor guidelines for Inpatients (after your surgery is over and you are in a regular room).    Your procedure is scheduled on: 08/06/23   Report to [facility] Main Entrance    Report to admitting at 7:35 AM   Call this number if you have problems the morning of surgery [phone]   Do not eat food :After Midnight.   After Midnight you may have the following liquids until 7:05 AM DAY OF SURGERY  Water Non-Citrus Juices (without pulp, NO RED-Apple, White grape, White cranberry) Black Coffee (NO MILK/CREAM OR CREAMERS, sugar ok)  Clear Tea (NO MILK/CREAM OR CREAMERS, sugar ok) regular and decaf                             Plain Jell-O (NO RED)                                           Fruit ices (not with fruit pulp, NO RED)                                     Popsicles (NO RED)                                                               Sports drinks like Gatorade (NO RED)    The day of surgery:  Drink ONE (1) Pre-Surgery Clear Ensure at 7:05 AM the morning of surgery. Drink in one sitting. Do not sip.  This drink was given to you during your hospital  pre-op appointment visit. Nothing else to drink after completing the  Pre-Surgery Clear Ensure.          If  you have questions, please contact your surgeon's office.   FOLLOW BOWEL PREP AND ANY ADDITIONAL PRE OP INSTRUCTIONS YOU RECEIVED FROM YOUR SURGEON'S OFFICE!!!     Oral Hygiene is also important to reduce your risk of infection.                                    Remember - BRUSH YOUR TEETH THE MORNING OF SURGERY WITH YOUR REGULAR TOOTHPASTE  DENTURES WILL BE REMOVED PRIOR TO SURGERY PLEASE DO NOT APPLY "Poly grip" OR ADHESIVES!!!  Stop all vitamins and herbal supplements 7 days before surgery.   Hold Jardiance 3 days. Last dose 08/02/23.   Take these medicines the morning of surgery with A SIP OF WATER: Tylenol, Allopurinol, Atorvastatin, Carvedilol, Claritin   Bring CPAP mask and tubing day of surgery.                              You may not have any metal on your body including jewelry, and body piercing             Do not wear lotions, powders, cologne, or deodorant              Men may shave face and neck.   Do not bring valuables to the hospital. [facility] IS NOT             RESPONSIBLE   FOR VALUABLES.   Contacts, glasses, dentures or bridgework may not be worn into surgery.   Bring small overnight bag day of surgery.   DO NOT BRING YOUR HOME MEDICATIONS TO THE HOSPITAL. PHARMACY WILL DISPENSE MEDICATIONS LISTED ON YOUR MEDICATION LIST TO YOU DURING YOUR ADMISSION IN THE HOSPITAL!   Special Instructions: Bring a copy of your healthcare power of attorney and living will documents the day of surgery if you haven't scanned them before.              Please read over the following fact sheets you were given: IF YOU HAVE QUESTIONS ABOUT YOUR PRE-OP INSTRUCTIONS PLEASE CALL [name]    If you received a COVID test during your pre-op visit  it is requested that you wear a mask when out in public, stay away from anyone that may not be feeling well and notify your surgeon if you develop symptoms. If you test positive for Covid or have been in contact with anyone  that has tested positive in the last 10 days please notify you surgeon.      Pre-operative 5 CHG Bath Instructions   You can play a key role in reducing the risk of infection after surgery. Your skin needs to be as free of germs as possible. You can reduce the number of germs on your skin by washing with CHG (chlorhexidine gluconate) soap before surgery. CHG is an antiseptic soap that kills germs and continues to kill germs even after washing.   DO NOT use if you have an allergy to chlorhexidine/CHG or antibacterial soaps. If your skin becomes reddened or irritated, stop using the CHG and notify one of our RNs at [phone].   Please shower with the CHG soap starting 4 days before surgery using the following schedule:     Please keep in mind the following:  DO NOT shave, including legs and underarms, starting the day of your first shower.   You may shave your face at any point before/day of surgery.  Place clean sheets on your bed the day you start using CHG soap. Use a clean washcloth (not used since being washed) for each shower. DO NOT sleep with pets once you start using the CHG.   CHG Shower Instructions:  If you choose to wash your hair and private area, wash first with your normal shampoo/soap.  After you use shampoo/soap, rinse your hair and body thoroughly to remove shampoo/soap residue.  Turn the water OFF and apply about 3 tablespoons (45 ml) of CHG soap to a CLEAN washcloth.  Apply CHG  soap ONLY FROM YOUR NECK DOWN TO YOUR TOES (washing for 3-5 minutes)  DO NOT use CHG soap on face, private areas, open wounds, or sores.  Pay special attention to the area where your surgery is being performed.  If you are having back surgery, having someone wash your back for you may be helpful. Wait 2 minutes after CHG soap is applied, then you may rinse off the CHG soap.  Pat dry with a clean towel  Put on clean clothes/pajamas   If you choose to wear lotion, please use ONLY the  CHG-compatible lotions on the back of this paper.     Additional instructions for the day of surgery: DO NOT APPLY any lotions, deodorants, cologne, or perfumes.   Put on clean/comfortable clothes.  Brush your teeth.  Ask your nurse before applying any prescription medications to the skin.      CHG Compatible Lotions   Aveeno Moisturizing lotion  Cetaphil Moisturizing Cream  Cetaphil Moisturizing Lotion  Clairol Herbal Essence Moisturizing Lotion, Dry Skin  Clairol Herbal Essence Moisturizing Lotion, Extra Dry Skin  Clairol Herbal Essence Moisturizing Lotion, Normal Skin  Curel Age Defying Therapeutic Moisturizing Lotion with Alpha Hydroxy  Curel Extreme Care Body Lotion  Curel Soothing Hands Moisturizing Hand Lotion  Curel Therapeutic Moisturizing Cream, Fragrance-Free  Curel Therapeutic Moisturizing Lotion, Fragrance-Free  Curel Therapeutic Moisturizing Lotion, Original Formula  Eucerin Daily Replenishing Lotion  Eucerin Dry Skin Therapy Plus Alpha Hydroxy Crme  Eucerin Dry Skin Therapy Plus Alpha Hydroxy Lotion  Eucerin Original Crme  Eucerin Original Lotion  Eucerin Plus Crme Eucerin Plus Lotion  Eucerin TriLipid Replenishing Lotion  Keri Anti-Bacterial Hand Lotion  Keri Deep Conditioning Original Lotion Dry Skin Formula Softly Scented  Keri Deep Conditioning Original Lotion, Fragrance Free Sensitive Skin Formula  Keri Lotion Fast Absorbing Fragrance Free Sensitive Skin Formula  Keri Lotion Fast Absorbing Softly Scented Dry Skin Formula  Keri Original Lotion  Keri Skin Renewal Lotion Keri Silky Smooth Lotion  Keri Silky Smooth Sensitive Skin Lotion  Nivea Body Creamy Conditioning Oil  Nivea Body Extra Enriched Teacher, adult education Moisturizing Lotion Nivea Crme  Nivea Skin Firming Lotion  NutraDerm 30 Skin Lotion  NutraDerm Skin Lotion  NutraDerm Therapeutic Skin Cream  NutraDerm Therapeutic Skin Lotion  ProShield Protective  Hand Cream  Provon moisturizing lotion  WHAT IS A BLOOD TRANSFUSION? Blood Transfusion Information  A transfusion is the replacement of blood or some of its parts. Blood is made up of multiple cells which provide different functions. Red blood cells carry oxygen and are used for blood loss replacement. White blood cells fight against infection. Platelets control bleeding. Plasma helps clot blood. Other blood products are available for specialized needs, such as hemophilia or other clotting disorders. BEFORE THE TRANSFUSION  Who gives blood for transfusions?  Healthy volunteers who are fully evaluated to make sure their blood is safe. This is blood bank blood. Transfusion therapy is the safest it has ever been in the practice of medicine. Before blood is taken from a donor, a complete history is taken to make sure that person has no history of diseases nor engages in risky social behavior (examples are intravenous drug use or sexual activity with multiple partners). The donor's travel history is screened to minimize risk of transmitting infections, such as malaria. The donated blood is tested for signs of infectious diseases, such as HIV and hepatitis. The blood is then tested to be sure it is compatible  with you in order to minimize the chance of a transfusion reaction. If you or a relative donates blood, this is often done in anticipation of surgery and is not appropriate for emergency situations. It takes many days to process the donated blood. RISKS AND COMPLICATIONS Although transfusion therapy is very safe and saves many lives, the main dangers of transfusion include:  Getting an infectious disease. Developing a transfusion reaction. This is an allergic reaction to something in the blood you were given. Every precaution is taken to prevent this. The decision to have a blood transfusion has been considered carefully by your caregiver before blood is given. Blood is not given unless the benefits  outweigh the risks. AFTER THE TRANSFUSION Right after receiving a blood transfusion, you will usually feel much better and more energetic. This is especially true if your red blood cells have gotten low (anemic). The transfusion raises the level of the red blood cells which carry oxygen, and this usually causes an energy increase. The nurse administering the transfusion will monitor you carefully for complications. HOME CARE INSTRUCTIONS  No special instructions are needed after a transfusion. You may find your energy is better. Speak with your caregiver about any limitations on activity for underlying diseases you may have. SEEK MEDICAL CARE IF:  Your condition is not improving after your transfusion. You develop redness or irritation at the intravenous (IV) site. SEEK IMMEDIATE MEDICAL CARE IF:  Any of the following symptoms occur over the next 12 hours: Shaking chills. You have a temperature by mouth above 102 F (38.9 C), not controlled by medicine. Chest, back, or muscle pain. People around you feel you are not acting correctly or are confused. Shortness of breath or difficulty breathing. Dizziness and fainting. You get a rash or develop hives. You have a decrease in urine output. Your urine turns a dark color or changes to pink, red, or brown. Any of the following symptoms occur over the next 10 days: You have a temperature by mouth above 102 F (38.9 C), not controlled by medicine. Shortness of breath. Weakness after normal activity. The white part of the eye turns yellow (jaundice). You have a decrease in the amount of urine or are urinating less often. Your urine turns a dark color or changes to pink, red, or brown. Document Released: 11/02/2000 Document Revised: 01/28/2012 Document Reviewed: 06/21/2008 Exit[employer] Patient Information 2014 [place], [place].  _______________________________________________________________________  Incentive Spirometer  An incentive spirometer  is a tool that can help keep your lungs clear and active. This tool measures how well you are filling your lungs with each breath. Taking long deep breaths may help reverse or decrease the chance of developing breathing (pulmonary) problems (especially infection) following: A long period of time when you are unable to move or be active. BEFORE THE PROCEDURE  If the spirometer includes an indicator to show your best effort, your nurse or respiratory therapist will set it to a desired goal. If possible, sit up straight or lean slightly forward. Try not to slouch. Hold the incentive spirometer in an upright position. INSTRUCTIONS FOR USE  Sit on the edge of your bed if possible, or sit up as far as you can in bed or on a chair. Hold the incentive spirometer in an upright position. Breathe out normally. Place the mouthpiece in your mouth and seal your lips tightly around it. Breathe in slowly and as deeply as possible, raising the piston or the ball toward the top of the column. Hold your breath  for 3-5 seconds or for as long as possible. Allow the piston or ball to fall to the bottom of the column. Remove the mouthpiece from your mouth and breathe out normally. Rest for a few seconds and repeat Steps 1 through 7 at least 10 times every 1-2 hours when you are awake. Take your time and take a few normal breaths between deep breaths. The spirometer may include an indicator to show your best effort. Use the indicator as a goal to work toward during each repetition. After each set of 10 deep breaths, practice coughing to be sure your lungs are clear. If you have an incision (the cut made at the time of surgery), support your incision when coughing by placing a pillow or rolled up towels firmly against it. Once you are able to get out of bed, walk around indoors and cough well. You may stop using the incentive spirometer when instructed by your caregiver.  RISKS AND COMPLICATIONS Take your time so you do  not get dizzy or light-headed. If you are in pain, you may need to take or ask for pain medication before doing incentive spirometry. It is harder to take a deep breath if you are having pain. AFTER USE Rest and breathe slowly and easily. It can be helpful to keep track of a log of your progress. Your caregiver can provide you with a simple table to help with this. If you are using the spirometer at home, follow these instructions: SEEK MEDICAL CARE IF:  You are having difficultly using the spirometer. You have trouble using the spirometer as often as instructed. Your pain medication is not giving enough relief while using the spirometer. You develop fever of 100.5 F (38.1 C) or higher. SEEK IMMEDIATE MEDICAL CARE IF:  You cough up bloody sputum that had not been present before. You develop fever of 102 F (38.9 C) or greater. You develop worsening pain at or near the incision site. MAKE SURE YOU:  Understand these instructions. Will watch your condition. Will get help right away if you are not doing well or get worse. Document Released: 03/18/2007 Document Revised: 01/28/2012 Document Reviewed: 05/19/2007 [facility] Patient Information 2014 [place], [place].   ________________________________________________________________________

## 2023-07-30 NOTE — Progress Notes (Addendum)
COVID Vaccine Completed: yes  Date of COVID positive in last 90 days:  PCP - Lonie Peak, PA Cardiologist - Nanetta Batty, MD LOV 07/25/22  Cardiac clearance by Edd Fabian 07/18/23 in Epic   Cardiac MRI- 03/11/23 Epic Chest x-ray -  EKG - 07/18/23 Epic Stress Test -  ECHO - 11/29/22 Epic Cardiac Cath - 09/11/21 Epic Pacemaker/ICD device last checked: Spinal Cord Stimulator:  Bowel Prep -   Sleep Study -  CPAP -   Fasting Blood Sugar -  Checks Blood Sugar _____ times a day  Last dose of GLP1 agonist-  N/A GLP1 instructions:  N/A   Last dose of SGLT-2 inhibitors-  N/A SGLT-2 instructions: N/A   Blood Thinner Instructions:  Time Aspirin Instructions: ASA 81 Last Dose:  Activity level:  Can go up a flight of stairs and perform activities of daily living without stopping and without symptoms of chest pain or shortness of breath.  Able to exercise without symptoms  Unable to go up a flight of stairs without symptoms of     Anesthesia review: STEMI, HTN, cardiomyopathy, OSA, CHF  Patient denies shortness of breath, fever, cough and chest pain at PAT appointment  Patient verbalized understanding of instructions that were given to them at the PAT appointment. Patient was also instructed that they will need to review over the PAT instructions again at home before surgery.

## 2023-07-31 ENCOUNTER — Encounter (HOSPITAL_COMMUNITY)
Admission: RE | Admit: 2023-07-31 | Discharge: 2023-07-31 | Disposition: A | Discharge status: Home / Self Care | Payer: Medicare PPO | Source: Ambulatory Visit | Attending: Orthopedic Surgery | Admitting: Orthopedic Surgery

## 2023-07-31 ENCOUNTER — Encounter (HOSPITAL_COMMUNITY): Payer: Self-pay

## 2023-07-31 ENCOUNTER — Other Ambulatory Visit: Payer: Self-pay

## 2023-07-31 VITALS — BP 101/64 | HR 74 | Temp 97.9°F | Resp 14 | Ht 72.0 in | Wt 267.0 lb

## 2023-07-31 DIAGNOSIS — Z01812 Encounter for preprocedural laboratory examination: Secondary | ICD-10-CM | POA: Insufficient documentation

## 2023-07-31 DIAGNOSIS — M1612 Unilateral primary osteoarthritis, left hip: Secondary | ICD-10-CM

## 2023-07-31 DIAGNOSIS — I10 Essential (primary) hypertension: Secondary | ICD-10-CM

## 2023-07-31 DIAGNOSIS — Z01818 Encounter for other preprocedural examination: Secondary | ICD-10-CM

## 2023-07-31 HISTORY — DX: Heart failure, unspecified: I50.9

## 2023-07-31 HISTORY — DX: Acute myocardial infarction, unspecified: I21.9

## 2023-07-31 LAB — CBC
HCT: 40.6 % (ref 39.0–52.0)
Hemoglobin: 12.9 g/dL — ABNORMAL LOW (ref 13.0–17.0)
MCH: 32 pg (ref 26.0–34.0)
MCHC: 31.8 g/dL (ref 30.0–36.0)
MCV: 100.7 fL — ABNORMAL HIGH (ref 80.0–100.0)
Platelets: 142 10*3/uL — ABNORMAL LOW (ref 150–400)
RBC: 4.03 MIL/uL — ABNORMAL LOW (ref 4.22–5.81)
RDW: 14.7 % (ref 11.5–15.5)
WBC: 6.5 10*3/uL (ref 4.0–10.5)
nRBC: 0 % (ref 0.0–0.2)

## 2023-07-31 LAB — BASIC METABOLIC PANEL
Anion gap: 7 (ref 5–15)
BUN: 27 mg/dL — ABNORMAL HIGH (ref 8–23)
CO2: 25 mmol/L (ref 22–32)
Calcium: 8.8 mg/dL — ABNORMAL LOW (ref 8.9–10.3)
Chloride: 105 mmol/L (ref 98–111)
Creatinine, Ser: 1.26 mg/dL — ABNORMAL HIGH (ref 0.61–1.24)
GFR, Estimated: 59 mL/min — ABNORMAL LOW (ref >60)
Glucose, Bld: 120 mg/dL — ABNORMAL HIGH (ref 70–99)
Potassium: 4.9 mmol/L (ref 3.5–5.1)
Sodium: 137 mmol/L (ref 135–145)

## 2023-07-31 LAB — SURGICAL PCR SCREEN
MRSA, PCR: NEGATIVE
Staphylococcus aureus: POSITIVE — AB

## 2023-07-31 NOTE — Progress Notes (Signed)
STAPH+ results routed to Dr. Olin. 

## 2023-08-01 NOTE — Progress Notes (Signed)
Anesthesia Chart Review   Case: [id number] Date/Time: 08/06/23 0950   Procedure: TOTAL HIP ARTHROPLASTY ANTERIOR APPROACH (Left: Hip)   Anesthesia type: Spinal   Pre-op diagnosis: Left hip osteoarthritis   Location: [facility] ROOM 09 / [facility]   Surgeons: Durene Romans, MD       DISCUSSION:77 y.o. never smoker with h/o HTN, sleep apnea with CPAP, CAD (DES to LAD 09/11/2021), ischemic cardiomyopathy, left hip OA scheduled for above procedure 08/06/2023 with Dr. Durene Romans.   Pt seen by cardiology 07/18/2023. Per OV note, "Chart reviewed as part of pre-operative protocol coverage. Given past medical history and time since last visit, based on ACC/AHA guidelines, Walter Anderson would be at acceptable risk for the planned procedure without further cardiovascular testing.    His RCRI is a class IV risk, 11% risk of major cardiac event.  He is able to complete greater than 4 METS of physical activity.   Patient was advised that if he develops new symptoms prior to surgery to contact our office to arrange a follow-up appointment.  He verbalized understanding.   Patient no longer taking Brilinta.  I would recommend that he continue to take his aspirin throughout the perioperative period"  VS: BP 101/64   Pulse 74   Temp 36.6 C (Oral)   Resp 14   Ht 6' (1.829 m)   Wt 121.1 kg   SpO2 99%   BMI 36.21 kg/m   PROVIDERS: Lonie Peak, PA-C is PCP   Cardiologist - Nanetta Batty, MD  LABS: Labs reviewed: Acceptable for surgery. (all labs ordered are listed, but only abnormal results are displayed)  Labs Reviewed  SURGICAL PCR SCREEN - Abnormal; Notable for the following components:      Result Value   Staphylococcus aureus POSITIVE (*)    All other components within normal limits  BASIC METABOLIC PANEL - Abnormal; Notable for the following components:   Glucose, Bld 120 (*)    BUN 27 (*)    Creatinine, Ser 1.26 (*)    Calcium 8.8 (*)    GFR, Estimated 59 (*)    All other components within  normal limits  CBC - Abnormal; Notable for the following components:   RBC 4.03 (*)    Hemoglobin 12.9 (*)    MCV 100.7 (*)    Platelets 142 (*)    All other components within normal limits  TYPE AND SCREEN     IMAGES:   EKG:   CV: Echo 11/29/2022  1. Suboptimol definity contrast study with reduced sensitivity to detect  apical LV thrombus. There is significant swirling of contrast in the apex  with poor opacifiation and off axis images. No obvious thrombus but  suboptimal study. Left ventricular  ejection fraction, by estimation, is 40 to 45%. The left ventricle has  mildly decreased function. The left ventricle demonstrates regional wall  motion abnormalities (see scoring diagram/findings for description). There  is mild left ventricular  hypertrophy of the basal-septal segment. Left ventricular diastolic  parameters are consistent with Grade I diastolic dysfunction (impaired  relaxation). There is hypokinesis of the left ventricular, apical segment,  septal wall, inferior wall and  inferolateral wall.   2. Right ventricular systolic function is normal. The right ventricular  size is normal. There is normal pulmonary artery systolic pressure. The  estimated right ventricular systolic pressure is 18.7 mmHg.   3. The mitral valve is normal in structure. No evidence of mitral valve  regurgitation. No evidence of mitral stenosis.  4. The aortic valve is tricuspid. There is moderate calcification of the  aortic valve. There is moderate thickening of the aortic valve. Aortic  valve regurgitation is not visualized. Aortic valve  sclerosis/calcification is present, without any evidence  of aortic stenosis.   5. There is mild dilatation of the ascending aorta, measuring 38 mm.   6. The inferior vena cava is normal in size with greater than 50%  respiratory variability, suggesting right atrial pressure of 3 mmHg.  Past Medical History:  Diagnosis Date   Allergy    Arthritis     Asthma    seasonal   CHF (congestive heart failure) (HCC)    Hypertension    Myocardial infarction (HCC)    Sleep apnea    uses CPAP    Past Surgical History:  Procedure Laterality Date   BACK SURGERY  2006   CHOLECYSTECTOMY N/A 11/15/2015   Procedure: LAPAROSCOPIC CHOLECYSTECTOMY WITH INTRAOPERATIVE CHOLANGIOGRAM;  Surgeon: Kieth Brightly, MD;  Location: [facility] OR[facility];  Service: General;  Laterality: N/A;   CORONARY/GRAFT ACUTE MI REVASCULARIZATION N/A 09/11/2021   Procedure: Coronary/Graft Acute MI Revascularization;  Surgeon: Runell Gess, MD;  Location: [facility]AS[facility] LAB;  Service: Cardiovascular;  Laterality: N/A;   FINGER ARTHROSCOPY  1990   3rd Finger Left Hand   LEFT HEART CATH AND CORONARY ANGIOGRAPHY N/A 09/11/2021   Procedure: LEFT HEART CATH AND CORONARY ANGIOGRAPHY;  Surgeon: Runell Gess, MD;  Location: [facility]VASIVE CV LAB;  Service: Cardiovascular;  Laterality: N/A;   ROTATOR CUFF REPAIR     right shoulder   TONSILLECTOMY  77 years old   TOTAL HIP ARTHROPLASTY Right 04/01/2018   Procedure: RIGHT TOTAL HIP ARTHROPLASTY ANTERIOR APPROACH;  Surgeon: Durene Romans, MD;  Location: [facility];  Service: Orthopedics;  Laterality: Right;  70 mins   UMBILICAL HERNIA REPAIR  11/15/2015   Procedure: HERNIA REPAIR UMBILICAL ADULT;  Surgeon: Kieth Brightly, MD;  Location: [facility] ORS;  Service: General;;    MEDICATIONS:  acetaminophen (TYLENOL) 500 MG tablet   allopurinol (ZYLOPRIM) 300 MG tablet   Ascorbic Acid (VITAMIN C PO)   aspirin 81 MG EC tablet   atorvastatin (LIPITOR) 80 MG tablet   carvedilol (COREG) 6.25 MG tablet   Cholecalciferol (VITAMIN D3) 250 MCG (10000 UT) capsule   Coenzyme Q10 400 MG CAPS   docusate sodium (COLACE) 100 MG capsule   empagliflozin (JARDIANCE) 10 MG TABS tablet   furosemide (LASIX) 40 MG tablet   loratadine (CLARITIN) 10 MG tablet   magnesium gluconate (MAGONATE) 500 MG tablet   nitroGLYCERIN (NITROSTAT) 0.4 MG SL tablet    Omega-3 Fatty Acids (OMEGA 3 PO)   sacubitril-valsartan (ENTRESTO) 97-103 MG   spironolactone (ALDACTONE) 25 MG tablet   No current facility-administered medications for this encounter.    [name], PA-C [facility] Pre-Surgical Testing [phone]

## 2023-08-01 NOTE — Anesthesia Preprocedure Evaluation (Addendum)
Anesthesia Evaluation  Patient identified by MRN, date of birth, ID band Patient awake    Reviewed: Allergy & Precautions, NPO status , Patient's Chart, lab work & pertinent test results, reviewed documented beta blocker date and time   Airway Mallampati: IV  TM Distance: >3 FB Neck ROM: Full    Dental  (+) Poor Dentition, Dental Advisory Given, Missing   Pulmonary asthma , sleep apnea and Continuous Positive Airway Pressure Ventilation    Pulmonary exam normal breath sounds clear to auscultation       Cardiovascular hypertension (117/64 preop), Pt. on medications and Pt. on home beta blockers + Past MI (2022), + Cardiac Stents (2022 LAD stent) and +CHF (LVEF 40-45%)  Normal cardiovascular exam Rhythm:Regular Rate:Normal  Echo 11/2022  1. Suboptimol definity contrast study with reduced sensitivity to detect  apical LV thrombus. There is significant swirling of contrast in the apex  with poor opacifiation and off axis images. No obvious thrombus but  suboptimal study. Left ventricular  ejection fraction, by estimation, is 40 to 45%. The left ventricle has  mildly decreased function. The left ventricle demonstrates regional wall  motion abnormalities (see scoring diagram/findings for description). There  is mild left ventricular  hypertrophy of the basal-septal segment. Left ventricular diastolic  parameters are consistent with Grade I diastolic dysfunction (impaired  relaxation). There is hypokinesis of the left ventricular, apical segment,  septal wall, inferior wall and  inferolateral wall.   2. Right ventricular systolic function is normal. The right ventricular  size is normal. There is normal pulmonary artery systolic pressure. The  estimated right ventricular systolic pressure is 18.7 mmHg.   3. The mitral valve is normal in structure. No evidence of mitral valve  regurgitation. No evidence of mitral stenosis.   4. The  aortic valve is tricuspid. There is moderate calcification of the  aortic valve. There is moderate thickening of the aortic valve. Aortic  valve regurgitation is not visualized. Aortic valve  sclerosis/calcification is present, without any evidence  of aortic stenosis.   5. There is mild dilatation of the ascending aorta, measuring 38 mm.   6. The inferior vena cava is normal in size with greater than 50%  respiratory variability, suggesting right atrial pressure of 3 mmHg.      Neuro/Psych negative neurological ROS  negative psych ROS   GI/Hepatic negative GI ROS, Neg liver ROS,,,  Endo/Other  Obesity BMI 36  Renal/GU Renal InsufficiencyRenal diseaseCr 1.26  negative genitourinary   Musculoskeletal  (+) Arthritis , Osteoarthritis,    Abdominal  (+) + obese  Peds  Hematology negative hematology ROS (+)   Anesthesia Other Findings   Reproductive/Obstetrics negative OB ROS                             Anesthesia Physical Anesthesia Plan  ASA: 3  Anesthesia Plan: MAC and Spinal   Post-op Pain Management:    Induction: Intravenous  PONV Risk Score and Plan: TIVA, Propofol infusion and Treatment may vary due to age or medical condition  Airway Management Planned: Natural Airway and Simple Face Mask  Additional Equipment: None  Intra-op Plan:   Post-operative Plan:   Informed Consent: I have reviewed the patients History and Physical, chart, labs and discussed the procedure including the risks, benefits and alternatives for the proposed anesthesia with the patient or authorized representative who has indicated his/her understanding and acceptance.     Dental advisory given  Plan  Discussed with: CRNA  Anesthesia Plan Comments:        Anesthesia Quick Evaluation

## 2023-08-06 ENCOUNTER — Ambulatory Visit (HOSPITAL_COMMUNITY): Payer: Self-pay | Admitting: Anesthesiology

## 2023-08-06 ENCOUNTER — Other Ambulatory Visit: Payer: Self-pay

## 2023-08-06 ENCOUNTER — Encounter (HOSPITAL_COMMUNITY)
Admission: RE | Disposition: A | Discharge status: Home / Self Care | Payer: Self-pay | Source: Ambulatory Visit | Attending: Orthopedic Surgery

## 2023-08-06 ENCOUNTER — Ambulatory Visit (HOSPITAL_COMMUNITY): Payer: Medicare PPO

## 2023-08-06 ENCOUNTER — Ambulatory Visit (HOSPITAL_COMMUNITY): Payer: Medicare PPO | Admitting: Physician Assistant

## 2023-08-06 ENCOUNTER — Observation Stay (HOSPITAL_COMMUNITY)
Admission: RE | Admit: 2023-08-06 | Discharge: 2023-08-07 | Disposition: A | Discharge status: Home / Self Care | Payer: Medicare PPO | Source: Ambulatory Visit | Attending: Orthopedic Surgery | Admitting: Orthopedic Surgery

## 2023-08-06 ENCOUNTER — Encounter (HOSPITAL_COMMUNITY): Payer: Self-pay | Admitting: Orthopedic Surgery

## 2023-08-06 DIAGNOSIS — Z96641 Presence of right artificial hip joint: Secondary | ICD-10-CM | POA: Insufficient documentation

## 2023-08-06 DIAGNOSIS — Z7982 Long term (current) use of aspirin: Secondary | ICD-10-CM | POA: Insufficient documentation

## 2023-08-06 DIAGNOSIS — Z471 Aftercare following joint replacement surgery: Secondary | ICD-10-CM | POA: Diagnosis not present

## 2023-08-06 DIAGNOSIS — I509 Heart failure, unspecified: Secondary | ICD-10-CM | POA: Diagnosis not present

## 2023-08-06 DIAGNOSIS — Z96642 Presence of left artificial hip joint: Secondary | ICD-10-CM

## 2023-08-06 DIAGNOSIS — Z01818 Encounter for other preprocedural examination: Secondary | ICD-10-CM

## 2023-08-06 DIAGNOSIS — N189 Chronic kidney disease, unspecified: Secondary | ICD-10-CM | POA: Diagnosis not present

## 2023-08-06 DIAGNOSIS — I130 Hypertensive heart and chronic kidney disease with heart failure and stage 1 through stage 4 chronic kidney disease, or unspecified chronic kidney disease: Secondary | ICD-10-CM

## 2023-08-06 DIAGNOSIS — I110 Hypertensive heart disease with heart failure: Secondary | ICD-10-CM | POA: Diagnosis not present

## 2023-08-06 DIAGNOSIS — Z79899 Other long term (current) drug therapy: Secondary | ICD-10-CM | POA: Diagnosis not present

## 2023-08-06 DIAGNOSIS — M1612 Unilateral primary osteoarthritis, left hip: Secondary | ICD-10-CM | POA: Diagnosis not present

## 2023-08-06 DIAGNOSIS — J45909 Unspecified asthma, uncomplicated: Secondary | ICD-10-CM | POA: Insufficient documentation

## 2023-08-06 DIAGNOSIS — Z87891 Personal history of nicotine dependence: Secondary | ICD-10-CM

## 2023-08-06 DIAGNOSIS — Z96643 Presence of artificial hip joint, bilateral: Secondary | ICD-10-CM | POA: Diagnosis not present

## 2023-08-06 HISTORY — PX: TOTAL HIP ARTHROPLASTY: SHX124

## 2023-08-06 LAB — GLUCOSE, CAPILLARY
Glucose-Capillary: 188 mg/dL — ABNORMAL HIGH (ref 70–99)
Glucose-Capillary: 177 mg/dL — ABNORMAL HIGH (ref 70–99)

## 2023-08-06 LAB — TYPE AND SCREEN
ABO/RH(D): O POS
Antibody Screen: NEGATIVE

## 2023-08-06 SURGERY — ARTHROPLASTY, HIP, TOTAL, ANTERIOR APPROACH
Anesthesia: Monitor Anesthesia Care | Site: Hip | Laterality: Left

## 2023-08-06 MED ORDER — PROPOFOL 1000 MG/100ML IV EMUL
INTRAVENOUS | Status: AC
  Filled 2023-08-06: qty 100

## 2023-08-06 MED ORDER — 0.9 % SODIUM CHLORIDE (POUR BTL) OPTIME
TOPICAL | Status: DC | PRN
  Administered 2023-08-06: 1000 mL

## 2023-08-06 MED ORDER — CARVEDILOL 6.25 MG PO TABS
6.2500 mg | ORAL_TABLET | Freq: Two times a day (BID) | ORAL | Status: DC
  Administered 2023-08-06 – 2023-08-07 (×2): 6.25 mg via ORAL
  Filled 2023-08-06 (×2): qty 1

## 2023-08-06 MED ORDER — SODIUM CHLORIDE (PF) 0.9 % IJ SOLN
INTRAMUSCULAR | Status: AC
  Filled 2023-08-06: qty 50

## 2023-08-06 MED ORDER — OXYCODONE HCL 5 MG PO TABS
10.0000 mg | ORAL_TABLET | ORAL | Status: DC | PRN
  Administered 2023-08-06: 10 mg via ORAL
  Filled 2023-08-06 (×2): qty 2

## 2023-08-06 MED ORDER — EPHEDRINE SULFATE (PRESSORS) 50 MG/ML IJ SOLN
INTRAMUSCULAR | Status: DC | PRN
Start: 2023-08-06 — End: 2023-08-06
  Administered 2023-08-06 (×3): 5 mg via INTRAVENOUS
  Administered 2023-08-06: 10 mg via INTRAVENOUS

## 2023-08-06 MED ORDER — HYDROMORPHONE HCL 1 MG/ML IJ SOLN: 0.2500 mg | INTRAMUSCULAR | Status: DC | PRN

## 2023-08-06 MED ORDER — LACTATED RINGERS IV SOLN: INTRAVENOUS | Status: DC

## 2023-08-06 MED ORDER — ACETAMINOPHEN 500 MG PO TABS
1000.0000 mg | ORAL_TABLET | Freq: Four times a day (QID) | ORAL | Status: DC
  Administered 2023-08-06 – 2023-08-07 (×3): 1000 mg via ORAL
  Filled 2023-08-06 (×3): qty 2

## 2023-08-06 MED ORDER — DEXTROSE 5 % IV SOLN
INTRAVENOUS | Status: DC | PRN
  Administered 2023-08-06: 3 g via INTRAVENOUS

## 2023-08-06 MED ORDER — PHENYLEPHRINE HCL-NACL 20-0.9 MG/250ML-% IV SOLN
INTRAVENOUS | Status: DC | PRN
Start: 2023-08-06 — End: 2023-08-06
  Administered 2023-08-06: 15 ug/min via INTRAVENOUS

## 2023-08-06 MED ORDER — GLYCOPYRROLATE 0.2 MG/ML IJ SOLN
INTRAMUSCULAR | Status: AC
  Filled 2023-08-06: qty 1

## 2023-08-06 MED ORDER — ACETAMINOPHEN 500 MG PO TABS
1000.0000 mg | ORAL_TABLET | Freq: Once | ORAL | Status: DC
  Filled 2023-08-06: qty 2

## 2023-08-06 MED ORDER — INSULIN ASPART 100 UNIT/ML IJ SOLN
0.0000 [IU] | Freq: Three times a day (TID) | INTRAMUSCULAR | Status: DC
  Administered 2023-08-06: 3 [IU] via SUBCUTANEOUS
  Administered 2023-08-07: 2 [IU] via SUBCUTANEOUS

## 2023-08-06 MED ORDER — NITROGLYCERIN 0.4 MG SL SUBL: 0.4000 mg | SUBLINGUAL_TABLET | SUBLINGUAL | Status: DC | PRN

## 2023-08-06 MED ORDER — SACUBITRIL-VALSARTAN 97-103 MG PO TABS
1.0000 | ORAL_TABLET | Freq: Two times a day (BID) | ORAL | Status: DC
  Administered 2023-08-07: 1 via ORAL
  Filled 2023-08-06: qty 1

## 2023-08-06 MED ORDER — DEXAMETHASONE SODIUM PHOSPHATE 10 MG/ML IJ SOLN
8.0000 mg | Freq: Once | INTRAMUSCULAR | Status: AC
  Administered 2023-08-06: 8 mg via INTRAVENOUS

## 2023-08-06 MED ORDER — TRANEXAMIC ACID-NACL 1000-0.7 MG/100ML-% IV SOLN
1000.0000 mg | INTRAVENOUS | Status: AC
  Administered 2023-08-06: 1000 mg via INTRAVENOUS
  Filled 2023-08-06: qty 100

## 2023-08-06 MED ORDER — ORAL CARE MOUTH RINSE: 15.0000 mL | Freq: Once | OROMUCOSAL | Status: AC

## 2023-08-06 MED ORDER — FENTANYL CITRATE (PF) 100 MCG/2ML IJ SOLN
INTRAMUSCULAR | Status: AC
  Filled 2023-08-06: qty 2

## 2023-08-06 MED ORDER — SENNA 8.6 MG PO TABS
2.0000 | ORAL_TABLET | Freq: Every day | ORAL | Status: DC
  Administered 2023-08-06: 17.2 mg via ORAL
  Filled 2023-08-06: qty 2

## 2023-08-06 MED ORDER — OXYCODONE HCL 5 MG PO TABS: 5.0000 mg | ORAL_TABLET | Freq: Once | ORAL | Status: DC | PRN

## 2023-08-06 MED ORDER — ALLOPURINOL 300 MG PO TABS
300.0000 mg | ORAL_TABLET | Freq: Every day | ORAL | Status: DC
  Administered 2023-08-06 – 2023-08-07 (×2): 300 mg via ORAL
  Filled 2023-08-06 (×2): qty 1

## 2023-08-06 MED ORDER — BISACODYL 10 MG RE SUPP: 10.0000 mg | Freq: Every day | RECTAL | Status: DC | PRN

## 2023-08-06 MED ORDER — CEFAZOLIN SODIUM-DEXTROSE 2-4 GM/100ML-% IV SOLN
2.0000 g | Freq: Four times a day (QID) | INTRAVENOUS | Status: AC
  Administered 2023-08-06 – 2023-08-07 (×2): 2 g via INTRAVENOUS
  Filled 2023-08-06 (×2): qty 100

## 2023-08-06 MED ORDER — BUPIVACAINE IN DEXTROSE 0.75-8.25 % IT SOLN
INTRATHECAL | Status: DC | PRN
  Administered 2023-08-06: 2 mL via INTRATHECAL

## 2023-08-06 MED ORDER — AMISULPRIDE (ANTIEMETIC) 5 MG/2ML IV SOLN: 10.0000 mg | Freq: Once | INTRAVENOUS | Status: DC | PRN

## 2023-08-06 MED ORDER — BUPIVACAINE-EPINEPHRINE 0.25% -1:200000 IJ SOLN
INTRAMUSCULAR | Status: AC
  Filled 2023-08-06: qty 1

## 2023-08-06 MED ORDER — POLYETHYLENE GLYCOL 3350 17 G PO PACK
17.0000 g | PACK | Freq: Two times a day (BID) | ORAL | Status: DC
  Administered 2023-08-07: 17 g via ORAL
  Filled 2023-08-06 (×2): qty 1

## 2023-08-06 MED ORDER — ONDANSETRON HCL 4 MG/2ML IJ SOLN
4.0000 mg | Freq: Four times a day (QID) | INTRAMUSCULAR | Status: DC | PRN
  Filled 2023-08-06: qty 2

## 2023-08-06 MED ORDER — DEXAMETHASONE SODIUM PHOSPHATE 10 MG/ML IJ SOLN
INTRAMUSCULAR | Status: AC
  Filled 2023-08-06: qty 1

## 2023-08-06 MED ORDER — FUROSEMIDE 40 MG PO TABS
40.0000 mg | ORAL_TABLET | Freq: Every day | ORAL | Status: DC
  Administered 2023-08-07: 40 mg via ORAL
  Filled 2023-08-06: qty 1

## 2023-08-06 MED ORDER — ONDANSETRON HCL 4 MG PO TABS: 4.0000 mg | ORAL_TABLET | Freq: Four times a day (QID) | ORAL | Status: DC | PRN

## 2023-08-06 MED ORDER — PNEUMOCOCCAL 20-VAL CONJ VACC 0.5 ML IM SUSY: 0.5000 mL | PREFILLED_SYRINGE | INTRAMUSCULAR | Status: DC | PRN

## 2023-08-06 MED ORDER — ONDANSETRON HCL 4 MG/2ML IJ SOLN: 4.0000 mg | Freq: Once | INTRAMUSCULAR | Status: DC | PRN

## 2023-08-06 MED ORDER — HYDROMORPHONE HCL 1 MG/ML IJ SOLN: 0.5000 mg | INTRAMUSCULAR | Status: DC | PRN

## 2023-08-06 MED ORDER — CHLORHEXIDINE GLUCONATE 0.12 % MT SOLN
15.0000 mL | Freq: Once | OROMUCOSAL | Status: AC
  Administered 2023-08-06: 15 mL via OROMUCOSAL

## 2023-08-06 MED ORDER — ALUM & MAG HYDROXIDE-SIMETH 200-200-20 MG/5ML PO SUSP: 30.0000 mL | ORAL | Status: DC | PRN

## 2023-08-06 MED ORDER — CEFAZOLIN IN SODIUM CHLORIDE 3-0.9 GM/100ML-% IV SOLN
3.0000 g | INTRAVENOUS | Status: DC
  Filled 2023-08-06: qty 100

## 2023-08-06 MED ORDER — METOCLOPRAMIDE HCL 5 MG PO TABS: 5.0000 mg | ORAL_TABLET | Freq: Three times a day (TID) | ORAL | Status: DC | PRN

## 2023-08-06 MED ORDER — ONDANSETRON HCL 4 MG/2ML IJ SOLN
INTRAMUSCULAR | Status: DC | PRN
Start: 2023-08-06 — End: 2023-08-06
  Administered 2023-08-06: 4 mg via INTRAVENOUS

## 2023-08-06 MED ORDER — PHENOL 1.4 % MT LIQD: 1.0000 | OROMUCOSAL | Status: DC | PRN

## 2023-08-06 MED ORDER — MENTHOL 3 MG MT LOZG: 1.0000 | LOZENGE | OROMUCOSAL | Status: DC | PRN

## 2023-08-06 MED ORDER — OXYCODONE HCL 5 MG/5ML PO SOLN: 5.0000 mg | Freq: Once | ORAL | Status: DC | PRN

## 2023-08-06 MED ORDER — PROPOFOL 500 MG/50ML IV EMUL
INTRAVENOUS | Status: DC | PRN
  Administered 2023-08-06: 30 ug/kg/min via INTRAVENOUS

## 2023-08-06 MED ORDER — EMPAGLIFLOZIN 10 MG PO TABS
10.0000 mg | ORAL_TABLET | Freq: Every day | ORAL | Status: DC
  Administered 2023-08-07: 10 mg via ORAL
  Filled 2023-08-06: qty 1

## 2023-08-06 MED ORDER — METHOCARBAMOL 500 MG IVPB - SIMPLE MED: 500.0000 mg | Freq: Four times a day (QID) | INTRAVENOUS | Status: DC | PRN

## 2023-08-06 MED ORDER — POVIDONE-IODINE 10 % EX SWAB: 2.0000 | Freq: Once | CUTANEOUS | Status: DC

## 2023-08-06 MED ORDER — EPHEDRINE 5 MG/ML INJ
INTRAVENOUS | Status: AC
  Filled 2023-08-06: qty 5

## 2023-08-06 MED ORDER — FENTANYL CITRATE (PF) 100 MCG/2ML IJ SOLN
INTRAMUSCULAR | Status: DC | PRN
  Administered 2023-08-06: 50 ug via INTRAVENOUS

## 2023-08-06 MED ORDER — METHOCARBAMOL 500 MG PO TABS
500.0000 mg | ORAL_TABLET | Freq: Four times a day (QID) | ORAL | Status: DC | PRN
  Administered 2023-08-06: 500 mg via ORAL
  Filled 2023-08-06: qty 1

## 2023-08-06 MED ORDER — TRANEXAMIC ACID-NACL 1000-0.7 MG/100ML-% IV SOLN
1000.0000 mg | Freq: Once | INTRAVENOUS | Status: AC
  Administered 2023-08-06: 1000 mg via INTRAVENOUS
  Filled 2023-08-06: qty 100

## 2023-08-06 MED ORDER — SPIRONOLACTONE 25 MG PO TABS
25.0000 mg | ORAL_TABLET | Freq: Every day | ORAL | Status: DC
  Administered 2023-08-07: 25 mg via ORAL
  Filled 2023-08-06: qty 1

## 2023-08-06 MED ORDER — ONDANSETRON HCL 4 MG/2ML IJ SOLN
INTRAMUSCULAR | Status: AC
  Filled 2023-08-06: qty 2

## 2023-08-06 MED ORDER — OXYCODONE HCL 5 MG PO TABS
5.0000 mg | ORAL_TABLET | ORAL | Status: DC | PRN
  Administered 2023-08-06: 10 mg via ORAL
  Administered 2023-08-07: 5 mg via ORAL
  Filled 2023-08-06 (×2): qty 2

## 2023-08-06 MED ORDER — ASPIRIN 81 MG PO CHEW
81.0000 mg | CHEWABLE_TABLET | Freq: Two times a day (BID) | ORAL | Status: DC
  Administered 2023-08-06 – 2023-08-07 (×2): 81 mg via ORAL
  Filled 2023-08-06 (×2): qty 1

## 2023-08-06 MED ORDER — SODIUM CHLORIDE (PF) 0.9 % IJ SOLN
INTRAMUSCULAR | Status: DC | PRN
  Administered 2023-08-06: 61 mL

## 2023-08-06 MED ORDER — GLYCOPYRROLATE 0.2 MG/ML IJ SOLN
INTRAMUSCULAR | Status: DC | PRN
Start: 2023-08-06 — End: 2023-08-06
  Administered 2023-08-06 (×2): .1 mg via INTRAVENOUS

## 2023-08-06 MED ORDER — SODIUM CHLORIDE 0.9 % IV SOLN: INTRAVENOUS | Status: DC

## 2023-08-06 MED ORDER — ATORVASTATIN CALCIUM 40 MG PO TABS
80.0000 mg | ORAL_TABLET | Freq: Every day | ORAL | Status: DC
  Administered 2023-08-06 – 2023-08-07 (×2): 80 mg via ORAL
  Filled 2023-08-06 (×2): qty 2

## 2023-08-06 MED ORDER — LORATADINE 10 MG PO TABS: 10.0000 mg | ORAL_TABLET | Freq: Every day | ORAL | Status: DC | PRN

## 2023-08-06 MED ORDER — STERILE WATER FOR IRRIGATION IR SOLN
Status: DC | PRN
  Administered 2023-08-06: 2000 mL

## 2023-08-06 MED ORDER — METOCLOPRAMIDE HCL 5 MG/ML IJ SOLN: 5.0000 mg | Freq: Three times a day (TID) | INTRAMUSCULAR | Status: DC | PRN

## 2023-08-06 MED ORDER — KETOROLAC TROMETHAMINE 30 MG/ML IJ SOLN
INTRAMUSCULAR | Status: AC
  Filled 2023-08-06: qty 1

## 2023-08-06 MED ORDER — INFLUENZA VAC A&B SURF ANT ADJ 0.5 ML IM SUSY: 0.5000 mL | PREFILLED_SYRINGE | INTRAMUSCULAR | Status: DC | PRN

## 2023-08-06 MED ORDER — DEXAMETHASONE SODIUM PHOSPHATE 10 MG/ML IJ SOLN
10.0000 mg | Freq: Once | INTRAMUSCULAR | Status: AC
  Administered 2023-08-07: 10 mg via INTRAVENOUS
  Filled 2023-08-06: qty 1

## 2023-08-06 MED ORDER — MAGNESIUM GLUCONATE 500 MG PO TABS
500.0000 mg | ORAL_TABLET | Freq: Every day | ORAL | Status: DC
  Administered 2023-08-07: 500 mg via ORAL
  Filled 2023-08-06: qty 1

## 2023-08-06 MED ORDER — DIPHENHYDRAMINE HCL 12.5 MG/5ML PO ELIX: 12.5000 mg | ORAL_SOLUTION | ORAL | Status: DC | PRN

## 2023-08-06 SURGICAL SUPPLY — 42 items
ADH SKN CLS APL DERMABOND .7 (GAUZE/BANDAGES/DRESSINGS) ×1
BAG COUNTER SPONGE SURGICOUNT (BAG) IMPLANT
BAG SPEC THK2 15X12 ZIP CLS (MISCELLANEOUS)
BAG SPNG CNTER NS LX DISP (BAG)
BAG ZIPLOCK 12X15 (MISCELLANEOUS) IMPLANT
BLADE SAG 18X100X1.27 (BLADE) ×1 IMPLANT
COVER PERINEAL POST (MISCELLANEOUS) ×1 IMPLANT
COVER SURGICAL LIGHT HANDLE (MISCELLANEOUS) ×1 IMPLANT
CUP ACET PINNACLE SECTR 56MM (Hips) IMPLANT
DERMABOND ADVANCED .7 DNX12 (GAUZE/BANDAGES/DRESSINGS) ×1 IMPLANT
DRAPE FOOT SWITCH (DRAPES) ×1 IMPLANT
DRAPE STERI IOBAN 125X83 (DRAPES) ×1 IMPLANT
DRAPE U-SHAPE 47X51 STRL (DRAPES) ×2 IMPLANT
DRESSING AQUACEL AG SP 3.5X10 (GAUZE/BANDAGES/DRESSINGS) ×1 IMPLANT
DRSG AQUACEL AG SP 3.5X10 (GAUZE/BANDAGES/DRESSINGS) ×1
DURAPREP 26ML APPLICATOR (WOUND CARE) ×1 IMPLANT
ELECT REM PT RETURN 15FT ADLT (MISCELLANEOUS) ×1 IMPLANT
GLOVE BIO SURGEON STRL SZ 6 (GLOVE) ×1 IMPLANT
GLOVE BIOGEL PI IND STRL 6.5 (GLOVE) ×1 IMPLANT
GLOVE BIOGEL PI IND STRL 7.5 (GLOVE) ×1 IMPLANT
GLOVE ORTHO TXT STRL SZ7.5 (GLOVE) ×2 IMPLANT
GOWN STRL REUS W/ TWL LRG LVL3 (GOWN DISPOSABLE) ×2 IMPLANT
GOWN STRL REUS W/TWL LRG LVL3 (GOWN DISPOSABLE) ×2
HEAD CERAMIC DELTA 36 PLUS 1.5 (Hips) IMPLANT
HOLDER FOLEY CATH W/STRAP (MISCELLANEOUS) ×1 IMPLANT
KIT TURNOVER KIT A (KITS) IMPLANT
NDL SAFETY ECLIP 18X1.5 (MISCELLANEOUS) IMPLANT
PACK ANTERIOR HIP CUSTOM (KITS) ×1 IMPLANT
PINNACLE ALTRX PLUS 4 N 36X56 (Hips) IMPLANT
PINNACLE SECTOR CUP 56MM (Hips) ×1 IMPLANT
SCREW 6.5MMX25MM (Screw) IMPLANT
STEM FEM ACTIS HIGH SZ7 (Stem) IMPLANT
SUT MNCRL AB 4-0 PS2 18 (SUTURE) ×1 IMPLANT
SUT STRATAFIX 0 PDS 27 VIOLET (SUTURE) ×1
SUT VIC AB 1 CT1 36 (SUTURE) ×3 IMPLANT
SUT VIC AB 2-0 CT1 27 (SUTURE) ×2
SUT VIC AB 2-0 CT1 TAPERPNT 27 (SUTURE) ×2 IMPLANT
SUTURE STRATFX 0 PDS 27 VIOLET (SUTURE) ×1 IMPLANT
SYR 3ML LL SCALE MARK (SYRINGE) IMPLANT
TRAY FOLEY MTR SLVR 16FR STAT (SET/KITS/TRAYS/PACK) IMPLANT
TUBE SUCTION HIGH CAP CLEAR NV (SUCTIONS) ×1 IMPLANT
WATER STERILE IRR 1000ML POUR (IV SOLUTION) ×1 IMPLANT

## 2023-08-06 NOTE — Discharge Instructions (Signed)

## 2023-08-06 NOTE — Interval H&P Note (Signed)
History and Physical Interval Note:  08/06/2023 8:40 AM  Walter Anderson  has presented today for surgery, with the diagnosis of Left hip osteoarthritis.  The various methods of treatment have been discussed with the patient and family. After consideration of risks, benefits and other options for treatment, the patient has consented to  Procedure(s): TOTAL HIP ARTHROPLASTY ANTERIOR APPROACH (Left) as a surgical intervention.  The patient's history has been reviewed, patient examined, no change in status, stable for surgery.  I have reviewed the patient's chart and labs.  Questions were answered to the patient's satisfaction.     Shelda Pal

## 2023-08-06 NOTE — Transfer of Care (Signed)
Immediate Anesthesia Transfer of Care Note  Patient: Walter Anderson  Procedure(s) Performed: TOTAL HIP ARTHROPLASTY ANTERIOR APPROACH (Left: Hip)  Patient Location: PACU  Anesthesia Type:Spinal  Level of Consciousness: awake, alert , and oriented  Airway & Oxygen Therapy: Patient Spontanous Breathing and Patient connected to face mask oxygen  Post-op Assessment: Report given to RN and Post -op Vital signs reviewed and stable  Post vital signs: Reviewed  Last Vitals:  Vitals Value Taken Time  BP 94/60 08/06/23 11:35  Temp    Pulse 70 08/06/23 11:35  Resp 21 08/06/23 1133  SpO2 99% 08/06/23 11:35  Vitals shown include unfiled device data.  Last Pain:  Vitals:   08/06/23 0811  TempSrc: Oral         Complications: No notable events documented.

## 2023-08-06 NOTE — Plan of Care (Signed)

## 2023-08-06 NOTE — Care Plan (Signed)
Ortho Bundle Case Management Note  Patient Details  Name: PLES FRAGER MRN: [id number] Date of Birth: 08-Apr-1946                  L THA on 08-06-23  DCP: Home with spouse  DME: No needs, has a RW  PT: HEP   DME Arranged:  N/A DME Agency:       Additional Comments: Please contact me with any questions of if this plan should need to change.   Ennis Forts, RN,CCM [facility]  [phone] 08/06/2023, 9:16 AM

## 2023-08-06 NOTE — Anesthesia Postprocedure Evaluation (Signed)
Anesthesia Post Note  Patient: Walter Anderson  Procedure(s) Performed: TOTAL HIP ARTHROPLASTY ANTERIOR APPROACH (Left: Hip)     Patient location during evaluation: PACU Anesthesia Type: MAC and Spinal Level of consciousness: awake and alert and oriented Pain management: pain level controlled Vital Signs Assessment: post-procedure vital signs reviewed and stable Respiratory status: spontaneous breathing, nonlabored ventilation and respiratory function stable Cardiovascular status: blood pressure returned to baseline and stable Postop Assessment: no headache, no backache, spinal receding and no apparent nausea or vomiting Anesthetic complications: no   No notable events documented.  Last Vitals:  Vitals:   08/06/23 1315 08/06/23 1330  BP: 124/71 103/76  Pulse: (!) 50 (!) 49  Resp: 17 13  Temp:    SpO2: 96% 95%    Last Pain:  Vitals:   08/06/23 1330  TempSrc:   PainSc: 0-No pain                 Lannie Fields

## 2023-08-06 NOTE — H&P (Signed)
TOTAL HIP ADMISSION H&P  Patient is admitted for left total hip arthroplasty.  Therapy Plans: HEP Disposition: Home with wife Planned DVT Prophylaxis: aspirin 81mg  BID DME needed: none PCP: Lonie Peak - clearance recieved Cardio: Dr. Allyson Sabal - clearance received (recommended to continue aspirin peri-operatively) TXA: IV Allergies: NKDA Anesthesia Concerns: none BMI: 37.9 Last HgbA1c: borderline   Other: - oxycodone, robaxin, tylenol - No hx of VTE or cancer  Subjective:  Chief Complaint: left hip pain  HPI: Walter Anderson, 77 y.o. male, has a history of pain and functional disability in the left hip(s) due to arthritis and patient has failed non-surgical conservative treatments for greater than 12 weeks to include NSAID's and/or analgesics and activity modification.  Onset of symptoms was gradual starting 2 years ago with gradually worsening course since that time.The patient noted no past surgery on the left hip(s).  Patient currently rates pain in the left hip at 8 out of 10 with activity. Patient has worsening of pain with activity and weight bearing, pain that interfers with activities of daily living, and pain with passive range of motion. Patient has evidence of joint space narrowing by imaging studies. This condition presents safety issues increasing the risk of falls. There is no current active infection.  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   Cholecystitis, acute with  cholelithiasis 11/16/2015   Past Medical History:  Diagnosis Date   Allergy    Arthritis    Asthma    seasonal   CHF (congestive heart failure) (HCC)    Hypertension    Myocardial infarction (HCC)    Sleep apnea    uses CPAP    Past Surgical History:  Procedure Laterality Date   BACK SURGERY  2006   CHOLECYSTECTOMY N/A 11/15/2015   Procedure: LAPAROSCOPIC CHOLECYSTECTOMY WITH INTRAOPERATIVE CHOLANGIOGRAM;  Surgeon: Kieth Brightly, MD;  Location: [facility] ORS;  Service: General;  Laterality: N/A;   CORONARY/GRAFT ACUTE MI REVASCULARIZATION N/A 09/11/2021   Procedure: Coronary/Graft Acute MI Revascularization;  Surgeon: Runell Gess, MD;  Location: [facility];  Service: Cardiovascular;  Laterality: N/A;   FINGER ARTHROSCOPY  1990   3rd Finger Left Hand   LEFT HEART CATH AND CORONARY ANGIOGRAPHY N/A 09/11/2021   Procedure: LEFT HEART CATH AND CORONARY ANGIOGRAPHY;  Surgeon: Runell Gess, MD;  Location: [facility];  Service: Cardiovascular;  Laterality: N/A;   ROTATOR CUFF REPAIR     right shoulder   TONSILLECTOMY  77 years old   TOTAL HIP ARTHROPLASTY Right 04/01/2018   Procedure: RIGHT TOTAL HIP ARTHROPLASTY ANTERIOR APPROACH;  Surgeon: Durene Romans, MD;  Location: [facility];  Service: Orthopedics;  Laterality: Right;  70 mins   UMBILICAL HERNIA REPAIR  11/15/2015   Procedure: HERNIA REPAIR UMBILICAL ADULT;  Surgeon: Kieth Brightly, MD;  Location: [facility] OR[facility];  Service: General;;    No current facility-administered medications for this encounter.   Current Outpatient Medications  Medication Sig Dispense Refill Last Dose   acetaminophen (TYLENOL) 500 MG tablet Take 2,000 mg  by mouth every 6 (six) hours as needed for pain.      allopurinol (ZYLOPRIM) 300 MG tablet Take 300 mg by mouth daily.      Ascorbic Acid (VITAMIN C PO) Take 1 tablet by mouth at bedtime.      aspirin 81 MG EC tablet Take 81 mg by mouth daily.      atorvastatin (LIPITOR) 80 MG  tablet TAKE 1 TABLET BY MOUTH EVERY DAY 90 tablet 3    carvedilol (COREG) 6.25 MG tablet TAKE 1 TABLET BY MOUTH 2 (TWO) TIMES DAILY WITH A MEAL. NEEDS FOLLOW UP APPOINTMENT FOR REFILLS 180 tablet 0    Cholecalciferol (VITAMIN D3) 250 MCG (10000 UT) capsule Take 10,000 Units by mouth daily.      Coenzyme Q10 400 MG CAPS Take 400 mg by mouth 2 (two) times daily.      docusate sodium (COLACE) 100 MG capsule Take 100 mg by mouth daily as needed for mild constipation.      empagliflozin (JARDIANCE) 10 MG TABS tablet TAKE 1 TABLET BY MOUTH EVERY DAY BEFORE BREAKFAST 90 tablet 0    furosemide (LASIX) 40 MG tablet TAKE 1 TABLET BY MOUTH DAILY AS NEEDED FOR FLUID OR EDEMA. (Patient taking differently: Take 40 mg by mouth daily.) 90 tablet 1    loratadine (CLARITIN) 10 MG tablet Take 10 mg by mouth daily as needed for allergies.      magnesium gluconate (MAGONATE) 500 MG tablet Take 500 mg by mouth daily.      nitroGLYCERIN (NITROSTAT) 0.4 MG SL tablet Place 1 tablet (0.4 mg total) under the tongue every 5 (five) minutes x 3 doses as needed for chest pain. 25 tablet 2    Omega-3 Fatty Acids (OMEGA 3 PO) Take 1 capsule by mouth daily.      sacubitril-valsartan (ENTRESTO) 97-103 MG Take 1 tablet by mouth 2 (two) times daily. 180 tablet 3    spironolactone (ALDACTONE) 25 MG tablet TAKE 1 TABLET (25 MG TOTAL) BY MOUTH DAILY. NEEDS FOLLOW UP APPOINTMENT FOR MORE REFILLS 90 tablet 0    No Known Allergies  Social History   Tobacco Use   Smoking status: Never   Smokeless tobacco: Former    Types: Chew    Quit date: 11/15/2005  Substance Use Topics   Alcohol use: Not Currently    Alcohol/week: 1.0 standard drink of alcohol    Types: 1 Standard drinks or equivalent per week    Comment: 1-2 Weekly    Family History  Problem Relation Age of Onset   Diabetes Mother    COPD Father    Heart disease Father      Review of Systems  Constitutional:  Negative for chills and fever.  Respiratory:  Negative for  cough and shortness of breath.   Cardiovascular:  Negative for chest pain.  Gastrointestinal:  Negative for nausea and vomiting.  Musculoskeletal:  Positive for arthralgias.     Objective:  Physical Exam Well nourished and well developed. General: Alert and oriented x3, cooperative and pleasant, no acute distress. Head: normocephalic, atraumatic, neck supple. Eyes: EOMI.  Musculoskeletal: Left hip exam: He has pain and limited hip flexion internal rotation to 5 degrees with pelvic tilting, external rotation to 20 degrees Limited active hip flexion external rotation and abduction Mild tenderness palpation laterally   Calves soft and nontender. Motor function intact in LE. Strength 5/5 LE bilaterally. Neuro: Distal pulses 2+. Sensation to light touch intact in LE.  Vital signs in last 24  hours:    Labs:   Estimated body mass index is 36.21 kg/m as calculated from the following:   Height as of 07/31/23: 6' (1.829 m).   Weight as of 07/31/23: 121.1 kg.   Imaging Review Plain radiographs demonstrate severe degenerative joint disease of the left hip(s). The bone quality appears to be adequate for age and reported activity level.      Assessment/Plan:  End stage arthritis, left hip(s)  The patient history, physical examination, clinical judgement of the provider and imaging studies are consistent with end stage degenerative joint disease of the left hip(s) and total hip arthroplasty is deemed medically necessary. The treatment options including medical management, injection therapy, arthroscopy and arthroplasty were discussed at length. The risks and benefits of total hip arthroplasty were presented and reviewed. The risks due to aseptic loosening, infection, stiffness, dislocation/subluxation,  thromboembolic complications and other imponderables were discussed.  The patient acknowledged the explanation, agreed to proceed with the plan and consent was signed. Patient is being  admitted for inpatient treatment for surgery, pain control, PT, OT, prophylactic antibiotics, VTE prophylaxis, progressive ambulation and ADL's and discharge planning.The patient is planning to be discharged  home.   Rosalene Billings, PA-C Orthopedic Surgery [facility] [place] [phone]

## 2023-08-06 NOTE — Progress Notes (Signed)
   08/06/23 2342  BiPAP/CPAP/SIPAP  $ Non-Invasive Home Ventilator  Initial  BiPAP/CPAP/SIPAP Pt Type Adult  BiPAP/CPAP/SIPAP DREAMSTATIOND  Mask Type Nasal pillows  Respiratory Rate 20 breaths/min  FiO2 (%) 21 %  Patient Home Equipment No (only his nasal pillows and tubing from home)  Auto Titrate Yes (automode, min6cm per pt request, max20cm h2o)  CPAP/SIPAP surface wiped down Yes  BiPAP/CPAP /SiPAP Vitals  Pulse Rate 62  Resp 20  SpO2 98 %  MEWS Score/Color  MEWS Score 0  MEWS Score Color Green

## 2023-08-06 NOTE — Op Note (Signed)
NAME:  Walter Anderson                ACCOUNT NO.: [id number]      MEDICAL RECORD NO.: [id number]      FACILITY:  [facility]      PHYSICIAN:  Shelda Pal  DATE OF BIRTH:  Mar 25, 1946     DATE OF PROCEDURE:  08/06/2023                                 OPERATIVE REPORT         PREOPERATIVE DIAGNOSIS: Left  hip osteoarthritis.      POSTOPERATIVE DIAGNOSIS:  Left hip osteoarthritis.      PROCEDURE:  Left total hip replacement through an anterior approach   utilizing DePuy THR system, component size 56 mm pinnacle cup, a size 36+4 neutral   Altrex liner, a size 7 Hi Actis stem with a 36+1.5 delta ceramic   ball.      SURGEON:  Madlyn Frankel. Charlann Boxer, M.D.      ASSISTANT:  Rosalene Billings, PA-C     ANESTHESIA:  Spinal.      SPECIMENS:  None.      COMPLICATIONS:  None.      BLOOD LOSS:  600 cc     DRAINS:  None.      INDICATION OF THE PROCEDURE:  Walter Anderson is a 77 y.o. male who had   presented to office for evaluation of left hip pain.  Radiographs revealed   progressive degenerative changes with bone-on-bone   articulation of the  hip joint, including subchondral cystic changes and osteophytes.  The patient had painful limited range of   motion significantly affecting their overall quality of life and function.  The patient was failing to    respond to conservative measures including medications and/or injections and activity modification and at this point was ready   to proceed with more definitive measures.  Consent was obtained for   benefit of pain relief.  Specific risks of infection, DVT, component   failure, dislocation, neurovascular injury, and need for revision surgery were reviewed in the office.     PROCEDURE IN DETAIL:  The patient was brought to operative theater.   Once adequate anesthesia, preoperative antibiotics, 2 gm of Ancef, 1 gm of Tranexamic Acid, and 10 mg of Decadron were administered, the patient was positioned supine on the [employer]  table.  Once the patient was safely positioned with adequate padding of boney prominences we predraped out the hip, and used fluoroscopy to confirm orientation of the pelvis.      The left hip was then prepped and draped from proximal iliac crest to   mid thigh with a shower curtain technique.      Time-out was performed identifying the patient, planned procedure, and the appropriate extremity.     An incision was then made 2 cm lateral to the   anterior superior iliac spine extending over the orientation of the   tensor fascia lata muscle and sharp dissection was carried down to the   fascia of the muscle.      The fascia was then incised.  The muscle belly was identified and swept   laterally and retractor placed along the superior neck.  Following   cauterization of the circumflex vessels and removing some pericapsular   fat, a second cobra retractor was placed on the inferior  neck.  A T-capsulotomy was made along the line of the   superior neck to the trochanteric fossa, then extended proximally and   distally.  Tag sutures were placed and the retractors were then placed   intracapsular.  We then identified the trochanteric fossa and   orientation of my neck cut and then made a neck osteotomy with the femur on traction.  The femoral   head was removed without difficulty or complication.  Traction was let   off and retractors were placed posterior and anterior around the   acetabulum.      The labrum and foveal tissue were debrided.  I began reaming with a 48 mm   reamer and reamed up to 55 mm reamer with good bony bed preparation and a 56 mm  cup was chosen.  The final 56 mm Pinnacle cup was then impacted under fluoroscopy to confirm the depth of penetration and orientation with respect to   Abduction and forward flexion.  A screw was placed into the ilium followed by the hole eliminator.  The final   36+4 neutral Altrex liner was impacted with good visualized rim fit.  The cup was  positioned anatomically within the acetabular portion of the pelvis.      At this point, the femur was rolled to 100 degrees.  Further capsule was   released off the inferior aspect of the femoral neck.  I then   released the superior capsule proximally.  With the leg in a neutral position the hook was placed laterally   along the femur under the vastus lateralis origin and elevated manually and then held in position using the hook attachment on the bed.  The leg was then extended and adducted with the leg rolled to 100   degrees of external rotation.  Retractors were placed along the medial calcar and posteriorly over the greater trochanter.  Once the proximal femur was fully   exposed, I used a box osteotome to set orientation.  I then began   broaching with the starting chili pepper broach and passed this by hand and then broached up to 7.  With the 7 broach in place I chose a high offset neck and did several trial reductions.  The offset was appropriate, leg lengths   appeared to be equal best matched with the +1.5 head ball trial confirmed radiographically.   Given these findings, I went ahead and dislocated the hip, repositioned all   retractors and positioned the right hip in the extended and abducted position.  The final 7 Hi Actis stem was   chosen and it was impacted down to the level of neck cut.  Based on this   and the trial reductions, a final 36+1.5 delta ceramic ball was chosen and   impacted onto a clean and dry trunnion, and the hip was reduced.  The   hip had been irrigated throughout the case again at this point.  I did   reapproximate the superior capsular leaflet to the anterior leaflet   using #1 Vicryl.  The fascia of the   tensor fascia lata muscle was then reapproximated using #1 Vicryl and #0 Stratafix sutures.  The   remaining wound was closed with 2-0 Vicryl and running 4-0 Monocryl.   The hip was cleaned, dried, and dressed sterilely using Dermabond and   Aquacel  dressing.  The patient was then brought   to recovery room in stable condition tolerating the procedure well.    Morrie Sheldon  Domenic Schwab, PA-C was present for the entirety of the case involved from   preoperative positioning, perioperative retractor management, general   facilitation of the case, as well as primary wound closure as assistant.            Madlyn Frankel Charlann Boxer, M.D.        08/06/2023 8:41 AM

## 2023-08-06 NOTE — Anesthesia Procedure Notes (Signed)
Spinal  Patient location during procedure: OR Start time: 08/06/2023 10:00 AM End time: 08/06/2023 10:06 AM Reason for block: surgical anesthesia Staffing Performed: anesthesiologist  Anesthesiologist: Lannie Fields, DO Performed by: Lannie Fields, DO Authorized by: Lannie Fields, DO   Preanesthetic Checklist Completed: patient identified, IV checked, risks and benefits discussed, surgical consent, monitors and equipment checked, pre-op evaluation and timeout performed Spinal Block Patient position: sitting Prep: DuraPrep and site prepped and draped Patient monitoring: cardiac monitor, continuous pulse ox and blood pressure Approach: midline Location: L3-4 Injection technique: single-shot Needle Needle type: Pencan  Needle gauge: 24 G Needle length: 12.7 cm Assessment Sensory level: T6 Events: CSF return Additional Notes Functioning IV was confirmed and monitors were applied. Sterile prep and drape, including hand hygiene and sterile gloves were used. The patient was positioned and the spine was prepped. The skin was anesthetized with lidocaine.  Free flow of clear CSF was obtained prior to injecting local anesthetic into the CSF.  The spinal needle aspirated freely following injection.  The needle was carefully withdrawn.  The patient tolerated the procedure well.   Difficult spinal at multiple levels, requiring pencan

## 2023-08-06 NOTE — Evaluation (Signed)
Physical Therapy Evaluation Patient Details Name: Walter Anderson MRN: [id number] DOB: [birth year] Today's Date: 08/06/2023  History of Present Illness  77 yo male presents to therapy s/p L THA, anterior approach on 08/06/2023 due to failure of conservative measures. Pt PMH includes but is not limited to OSA on CPAP, HLD, HTN, STEMI, R THA, AA (2019), CHF, and back surgery.  Clinical Impression     Walter Anderson is a 77 y.o. male POD 0 s/p L THA. Patient reports IND with mobility at baseline. Patient is now limited by functional impairments (see PT problem list below) and requires S for bed mobility and CGA and cues for transfers. Patient was able to ambulate 70 feet with RW and CGA level of assist. Patient instructed in exercise to facilitate ROM and circulation to manage edema. Patient will benefit from continued skilled PT interventions to address impairments and progress towards PLOF. Acute PT will follow to progress mobility and stair training in preparation for safe discharge home with family support and HEP.       If plan is discharge home, recommend the following: A little help with walking and/or transfers;A little help with bathing/dressing/bathroom;Assistance with cooking/housework;Assist for transportation;Help with stairs or ramp for entrance   Can travel by private vehicle        Equipment Recommendations Rolling walker (2 wheels)  Recommendations for Other Services       Functional Status Assessment Patient has had a recent decline in their functional status and demonstrates the ability to make significant improvements in function in a reasonable and predictable amount of time.     Precautions / Restrictions Precautions Precautions: Fall Restrictions Weight Bearing Restrictions: No      Mobility  Bed Mobility Overal bed mobility: Needs Assistance Bed Mobility: Supine to Sit     Supine to sit: Supervision, HOB elevated, Used rails     General bed mobility comments:  min cues    Transfers Overall transfer level: Needs assistance Equipment used: Rolling walker (2 wheels) Transfers: Sit to/from Stand Sit to Stand: Contact guard assist, From elevated surface           General transfer comment: min cues with pt able to push with R UE    Ambulation/Gait Ambulation/Gait assistance: Contact guard assist Gait Distance (Feet): 70 Feet Assistive device: Rolling walker (2 wheels) Gait Pattern/deviations: Step-to pattern, Antalgic Gait velocity: decreased     General Gait Details: cues for proper body position inside RW and posture  Stairs            Wheelchair Mobility     Tilt Bed    Modified Rankin (Stroke Patients Only)       Balance Overall balance assessment: Needs assistance Sitting-balance support: Feet supported Sitting balance-Leahy Scale: Good     Standing balance support: Bilateral upper extremity supported, During functional activity, Reliant on assistive device for balance Standing balance-Leahy Scale: Fair Standing balance comment: static standing no UE support, B UE support at [facility] for dynamic tasks                             Pertinent Vitals/Pain Pain Assessment Pain Assessment: 0-10 Pain Score: 2  Pain Location: L LE and hip Pain Descriptors / Indicators: Tingling, Operative site guarding Pain Intervention(s): Limited activity within patient's tolerance, Monitored during session, Patient requesting pain meds-RN notified, Ice applied    Home Living Family/patient expects to be discharged to:: Private residence Living Arrangements: Spouse/significant  other (pt reports wife uses RW for amb and occationally requires physical assist for mobility tasks) Available Help at Discharge: Family Type of Home: House Home Access: Stairs to enter Entrance Stairs-Rails: Left Entrance Stairs-Number of Steps: 2 + 1   Home Layout: One level Home Equipment: Standard Walker;Grab bars - tub/shower;Grab bars -  toilet;Lift chair      Prior Function Prior Level of Function : Independent/Modified Independent;Driving             Mobility Comments: IND no AD for all ADLs, self care tasks and IADLs       Extremity/Trunk Assessment        Lower Extremity Assessment Lower Extremity Assessment: LLE deficits/detail LLE Deficits / Details: ankle DF/PF 5/5 LLE Sensation: WNL    Cervical / Trunk Assessment Cervical / Trunk Assessment: Back Surgery  Communication   Communication Communication: No apparent difficulties  Cognition Arousal: Alert Behavior During Therapy: WFL for tasks assessed/performed Overall Cognitive Status: Within Functional Limits for tasks assessed                                          General Comments      Exercises Total Joint Exercises Ankle Circles/Pumps: AROM, Both, 20 reps   Assessment/Plan    PT Assessment Patient needs continued PT services  PT Problem List Decreased strength;Decreased range of motion;Decreased balance;Decreased activity tolerance;Decreased mobility;Decreased coordination;Pain       PT Treatment Interventions DME instruction;Gait training;Stair training;Functional mobility training;Therapeutic activities;Therapeutic exercise;Balance training;Neuromuscular re-education;Patient/family education;Modalities    PT Goals (Current goals can be found in the Care Plan section)  Acute Rehab PT Goals Patient Stated Goal: to be able to get home PT Goal Formulation: With patient Time For Goal Achievement: 08/20/23 Potential to Achieve Goals: Good    Frequency 7X/week     Co-evaluation               AM-PAC PT "6 Clicks" Mobility  Outcome Measure Help needed turning from your back to your side while in a flat bed without using bedrails?: None Help needed moving from lying on your back to sitting on the side of a flat bed without using bedrails?: None Help needed moving to and from a bed to a chair (including a  wheelchair)?: A Little Help needed standing up from a chair using your arms (e.g., wheelchair or bedside chair)?: A Little Help needed to walk in hospital room?: A Little Help needed climbing 3-5 steps with a railing? : A Lot 6 Click Score: 19    End of Session   Activity Tolerance: Patient tolerated treatment well Patient left: in chair;with call bell/phone within reach;with nursing/sitter in room Nurse Communication: Mobility status;Patient requests pain meds PT Visit Diagnosis: Unsteadiness on feet (R26.81);Other abnormalities of gait and mobility (R26.89);Difficulty in walking, not elsewhere classified (R26.2);Pain Pain - Right/Left: Left Pain - part of body: Hip;Leg    Time: 1610-9604 PT Time Calculation (min) (ACUTE ONLY): 26 min   Charges:   PT Evaluation $PT Eval Low Complexity: 1 Low PT Treatments $Gait Training: 8-22 mins PT General Charges $$ ACUTE PT VISIT: 1 Visit         Johnny Bridge, PT Acute Rehab   Jacqualyn Posey 08/06/2023, 6:14 PM

## 2023-08-07 ENCOUNTER — Encounter (HOSPITAL_COMMUNITY): Payer: Self-pay | Admitting: Orthopedic Surgery

## 2023-08-07 DIAGNOSIS — I509 Heart failure, unspecified: Secondary | ICD-10-CM | POA: Diagnosis not present

## 2023-08-07 DIAGNOSIS — Z87891 Personal history of nicotine dependence: Secondary | ICD-10-CM | POA: Diagnosis not present

## 2023-08-07 DIAGNOSIS — J45909 Unspecified asthma, uncomplicated: Secondary | ICD-10-CM | POA: Diagnosis not present

## 2023-08-07 DIAGNOSIS — Z79899 Other long term (current) drug therapy: Secondary | ICD-10-CM | POA: Diagnosis not present

## 2023-08-07 DIAGNOSIS — I110 Hypertensive heart disease with heart failure: Secondary | ICD-10-CM | POA: Diagnosis not present

## 2023-08-07 DIAGNOSIS — Z96641 Presence of right artificial hip joint: Secondary | ICD-10-CM | POA: Diagnosis not present

## 2023-08-07 DIAGNOSIS — M1612 Unilateral primary osteoarthritis, left hip: Secondary | ICD-10-CM | POA: Diagnosis not present

## 2023-08-07 DIAGNOSIS — Z7982 Long term (current) use of aspirin: Secondary | ICD-10-CM | POA: Diagnosis not present

## 2023-08-07 LAB — GLUCOSE, CAPILLARY: Glucose-Capillary: 130 mg/dL — ABNORMAL HIGH (ref 70–99)

## 2023-08-07 MED ORDER — OXYCODONE HCL 5 MG PO TABS: 5.0000 mg | ORAL_TABLET | ORAL | 0 refills | Status: DC | PRN

## 2023-08-07 MED ORDER — METHOCARBAMOL 500 MG PO TABS: 500.0000 mg | ORAL_TABLET | Freq: Four times a day (QID) | ORAL | 2 refills | Status: AC | PRN

## 2023-08-07 MED ORDER — ASPIRIN 81 MG PO CHEW: 81.0000 mg | CHEWABLE_TABLET | Freq: Two times a day (BID) | ORAL | 0 refills | Status: AC

## 2023-08-07 MED ORDER — POLYETHYLENE GLYCOL 3350 17 G PO PACK: 17.0000 g | PACK | Freq: Two times a day (BID) | ORAL | 0 refills | Status: DC

## 2023-08-07 MED ORDER — SENNA 8.6 MG PO TABS: 2.0000 | ORAL_TABLET | Freq: Every day | ORAL | 0 refills | Status: AC

## 2023-08-07 NOTE — Progress Notes (Signed)
   Subjective: 1 Day Post-Op Procedure(s) (LRB): TOTAL HIP ARTHROPLASTY ANTERIOR APPROACH (Left) Patient reports pain as mild.   Patient seen in rounds with Dr. Charlann Boxer. Patient is resting in bed on exam this morning. No acute events overnight. Foley catheter removed. Patient ambulated 70 feet with PT yesterday. We will start therapy today.   Objective: Vital signs in last 24 hours: Temp:  [97.4 F (36.3 C)-98 F (36.7 C)] 97.5 F (36.4 C) (09/18 0552) Pulse Rate:  [45-75] 64 (09/18 0552) Resp:  [13-20] 18 (09/18 0552) BP: (94-128)/(55-95) 111/62 (09/18 0552) SpO2:  [94 %-100 %] 100 % (09/18 0552) FiO2 (%):  [21 %] 21 % (09/17 2342)  Intake/Output from previous day:  Intake/Output Summary (Last 24 hours) at 08/07/2023 0742 Last data filed at 08/07/2023 0600 Gross per 24 hour  Intake 3065.08 ml  Output 2600 ml  Net 465.08 ml     Intake/Output this shift: No intake/output data recorded.  Labs: Recent Labs    08/07/23 0321  HGB 10.0*   Recent Labs    08/07/23 0321  WBC 10.5  RBC 3.08*  HCT 30.8*  PLT 121*   Recent Labs    08/07/23 0321  NA 131*  K 4.3  CL 103  CO2 22  BUN 21  CREATININE 0.95  GLUCOSE 154*  CALCIUM 8.4*   No results for input(s): "LABPT", "INR" in the last 72 hours.  Exam: General - Patient is Alert and Oriented Extremity - Neurologically intact Sensation intact distally Intact pulses distally Dorsiflexion/Plantar flexion intact Dressing - dressing C/D/I Motor Function - intact, moving foot and toes well on exam.   Past Medical History:  Diagnosis Date   Allergy    Arthritis    Asthma    seasonal   CHF (congestive heart failure) (HCC)    Hypertension    Myocardial infarction (HCC)    Sleep apnea    uses CPAP    Assessment/Plan: 1 Day Post-Op Procedure(s) (LRB): TOTAL HIP ARTHROPLASTY ANTERIOR APPROACH (Left) Principal Problem:   S/P total left hip arthroplasty  Estimated body mass index is 36.21 kg/m as calculated from  the following:   Height as of this encounter: 6' (1.829 m).   Weight as of this encounter: 121.1 kg. Advance diet Up with therapy D/C IV fluids  DVT Prophylaxis - Aspirin Weight bearing as tolerated.  Hgb stable at 10 this AM.  Plan is to go Home after hospital stay. Plan for discharge today after meeting goals with therapy. Follow up in the office in 2 weeks.   Rosalene Billings, PA-C Orthopedic Surgery [phone] 08/07/2023, 7:42 AM

## 2023-08-07 NOTE — TOC Transition Note (Signed)
Transition of Care [facility]) - CM/SW Discharge Note  Patient Details  Name: Walter Anderson MRN: [id number] Date of Birth: 05-May-1946  [facility] CM/SW Contact:  Ewing Schlein, LCSW Phone Number: 08/07/2023, 9:36 AM  Clinical Narrative: Patient is expected to discharge home after working with PT. CSW met with patient to confirm discharge plan and needs. Patient will go home with a home exercise program (HEP). Per patient, he will need a rolling walker as he does not have one at home. [employer] delivered rolling walker to patient's room. TOC signing off.  Final next level of care: Home/Self Care Barriers to Discharge: No Barriers Identified  Patient Goals and CMS Choice CMS Medicare.gov Compare Post Acute Care list provided to:: Patient Choice offered to / list presented to : Patient  Discharge Plan and Services Additional resources added to the After Visit Summary for        DME Arranged: Walker rolling DME Agency: [employer] Date DME Agency Contacted: 08/07/23 Representative spoke with at DME Agency: Yvonna Alanis  Social Determinants of Health (SDOH) Interventions SDOH Screenings   Food Insecurity: No Food Insecurity (08/06/2023)  Housing: Low Risk  (08/06/2023)  Transportation Needs: No Transportation Needs (08/06/2023)  Utilities: Not At Risk (08/06/2023)  Alcohol Screen: Low Risk  (09/13/2021)  Financial Resource Strain: Low Risk  (09/13/2021)  Tobacco Use: Medium Risk (08/06/2023)   Readmission Risk Interventions     No data to display

## 2023-08-07 NOTE — Progress Notes (Signed)
Physical Therapy Treatment Patient Details Name: Walter Anderson MRN: [id number] DOB: [birth year] Today's Date: 08/07/2023   History of Present Illness 77 yo male presents to therapy s/p L THA, anterior approach on 08/06/2023 due to failure of conservative measures. Pt PMH includes but is not limited to OSA on CPAP, HLD, HTN, STEMI, R THA, AA (2019), CHF, and back surgery.    PT Comments  Pt is progressing well with mobility and is ready to DC home from a PT standpoint. He ambulated 52' with RW, completed stair training, and demonstrates good understanding of HEP.     If plan is discharge home, recommend the following: A little help with bathing/dressing/bathroom;Assistance with cooking/housework;Assist for transportation;Help with stairs or ramp for entrance   Can travel by private vehicle        Equipment Recommendations  Rolling walker (2 wheels)    Recommendations for Other Services       Precautions / Restrictions Precautions Precautions: Fall Restrictions Weight Bearing Restrictions: No Other Position/Activity Restrictions: WBAT     Mobility  Bed Mobility Overal bed mobility: Needs Assistance Bed Mobility: Supine to Sit     Supine to sit: Supervision, Used rails, HOB elevated     General bed mobility comments: gait belt used as LLE lifter    Transfers Overall transfer level: Needs assistance Equipment used: Rolling walker (2 wheels) Transfers: Sit to/from Stand Sit to Stand: Supervision           General transfer comment: VCs hand placement    Ambulation/Gait Ambulation/Gait assistance: Supervision Gait Distance (Feet): 130 Feet Assistive device: Rolling walker (2 wheels) Gait Pattern/deviations: Step-to pattern, Decreased step length - right, Decreased step length - left Gait velocity: decreased     General Gait Details: VCs sequencing, no loss of balance   Stairs Stairs: Yes Stairs assistance: Contact guard assist Stair Management: One rail  Right, Forwards, Step to pattern, With cane Number of Stairs: 2 General stair comments: VCs sequencing   Wheelchair Mobility     Tilt Bed    Modified Rankin (Stroke Patients Only)       Balance Overall balance assessment: Needs assistance Sitting-balance support: Feet supported Sitting balance-Leahy Scale: Good     Standing balance support: Bilateral upper extremity supported, During functional activity, Reliant on assistive device for balance Standing balance-Leahy Scale: Fair Standing balance comment: static standing no UE support, B UE support at RW for dynamic tasks                            Cognition Arousal: Alert Behavior During Therapy: [facility] for tasks assessed/performed Overall Cognitive Status: Within Functional Limits for tasks assessed                                          Exercises Total Joint Exercises Ankle Circles/Pumps: AROM, Both, 20 reps Quad Sets: AROM, Both, 5 reps, Supine Short Arc Quad: AROM, Left, 5 reps, Supine Heel Slides: AAROM, Left, 5 reps, Supine Hip ABduction/ADduction: AAROM, Left, 5 reps, Supine Long Arc Quad: AROM, Left, 5 reps, Seated    General Comments        Pertinent Vitals/Pain Pain Assessment Pain Score: 4  Pain Location: L hip Pain Descriptors / Indicators: Sore Pain Intervention(s): Limited activity within patient's tolerance, Monitored during session, Ice applied, Premedicated before session    Home Living  Prior Function            PT Goals (current goals can now be found in the care plan section) Acute Rehab PT Goals Patient Stated Goal: work in the garden, run the dogs PT Goal Formulation: With patient Time For Goal Achievement: 08/20/23 Potential to Achieve Goals: Good Progress towards PT goals: Goals met/education completed, patient discharged from PT    Frequency    7X/week      PT Plan      Co-evaluation               AM-PAC PT "6 Clicks" Mobility   Outcome Measure  Help needed turning from your back to your side while in a flat bed without using bedrails?: None Help needed moving from lying on your back to sitting on the side of a flat bed without using bedrails?: None Help needed moving to and from a bed to a chair (including a wheelchair)?: None Help needed standing up from a chair using your arms (e.g., wheelchair or bedside chair)?: None Help needed to walk in hospital room?: None Help needed climbing 3-5 steps with a railing? : A Little 6 Click Score: 23    End of Session Equipment Utilized During Treatment: Gait belt Activity Tolerance: Patient tolerated treatment well Patient left: in chair;with call bell/phone within reach Nurse Communication: Mobility status PT Visit Diagnosis: Unsteadiness on feet (R26.81);Other abnormalities of gait and mobility (R26.89);Difficulty in walking, not elsewhere classified (R26.2);Pain Pain - Right/Left: Left Pain - part of body: Hip;Leg     Time: 6962-9528 PT Time Calculation (min) (ACUTE ONLY): 33 min  Charges:    $Gait Training: 8-22 mins $Therapeutic Exercise: 8-22 mins PT General Charges $$ ACUTE PT VISIT: 1 Visit                     Ralene Bathe Kistler PT 08/07/2023  Acute Rehabilitation Services  Office [phone]

## 2023-08-08 LAB — HEMOGLOBIN A1C
Hgb A1c MFr Bld: 6.1 % — ABNORMAL HIGH (ref 4.8–5.6)
Mean Plasma Glucose: 128 mg/dL

## 2023-08-20 NOTE — Discharge Summary (Signed)
Patient ID: Walter Anderson MRN: [id number] DOB/AGE: 1945/12/25 77 y.o.  Admit date: 08/06/2023 Discharge date: 08/07/2023  Admission Diagnoses:  Left hip osteoarthritis  Discharge Diagnoses:  Principal Problem:   S/P total left hip arthroplasty   Past Medical History:  Diagnosis Date   Allergy    Arthritis    Asthma    seasonal   CHF (congestive heart failure) (HCC)    Hypertension    Myocardial infarction (HCC)    Sleep apnea    uses CPAP    Surgeries: Procedure(s): TOTAL HIP ARTHROPLASTY ANTERIOR APPROACH on 08/06/2023   Consultants:   Discharged Condition: Improved  Hospital Course: Walter Anderson is an 77 y.o. male who was admitted 08/06/2023 for operative treatment ofS/P total left hip arthroplasty. Patient has severe unremitting pain that affects sleep, daily activities, and work/hobbies. After pre-op clearance the patient was taken to the operating room on 08/06/2023 and underwent  Procedure(s): TOTAL HIP ARTHROPLASTY ANTERIOR APPROACH.    Patient was given perioperative antibiotics:  Anti-infectives (From admission, onward)    Start     Dose/Rate Route Frequency Ordered Stop   08/06/23 1600  ceFAZolin (ANCEF) IVPB 2g/100 mL premix        2 g 200 mL/hr over 30 Minutes Intravenous Every 6 hours 08/06/23 1348 08/07/23 0040   08/06/23 0730  ceFAZolin (ANCEF) IVPB 3g/100 mL premix  Status:  Discontinued        3 g 200 mL/hr over 30 Minutes Intravenous On call to O.R. 08/06/23 0726 08/06/23 1337        Patient was given sequential compression devices, early ambulation, and chemoprophylaxis to prevent DVT. Patient worked with PT and was meeting their goals regarding safe ambulation and transfers.  Patient benefited maximally from hospital stay and there were no complications.    Recent vital signs: No data found.   Recent laboratory studies: No results for input(s): "WBC", "HGB", "HCT", "PLT", "NA", "K", "CL", "CO2", "BUN", "CREATININE", "GLUCOSE", "INR", "CALCIUM"  in the last 72 hours.  Invalid input(s): "PT", "2"   Discharge Medications:   Allergies as of 08/07/2023   No Known Allergies      Medication List     STOP taking these medications    aspirin EC 81 MG tablet Replaced by: aspirin 81 MG chewable tablet       TAKE these medications    acetaminophen 500 MG tablet Commonly known as: TYLENOL Take 2,000 mg by mouth every 6 (six) hours as needed for pain.   allopurinol 300 MG tablet Commonly known as: ZYLOPRIM Take 300 mg by mouth daily.   aspirin 81 MG chewable tablet Chew 1 tablet (81 mg total) by mouth 2 (two) times daily for 28 days. Replaces: aspirin EC 81 MG tablet   atorvastatin 80 MG tablet Commonly known as: LIPITOR TAKE 1 TABLET BY MOUTH EVERY DAY   carvedilol 6.25 MG tablet Commonly known as: COREG TAKE 1 TABLET BY MOUTH 2 (TWO) TIMES DAILY WITH A MEAL. NEEDS FOLLOW UP APPOINTMENT FOR REFILLS   Coenzyme Q10 400 MG Caps Take 400 mg by mouth 2 (two) times daily.   docusate sodium 100 MG capsule Commonly known as: COLACE Take 100 mg by mouth daily as needed for mild constipation.   Entresto 97-103 MG Generic drug: sacubitril-valsartan Take 1 tablet by mouth 2 (two) times daily.   furosemide 40 MG tablet Commonly known as: LASIX TAKE 1 TABLET BY MOUTH DAILY AS NEEDED FOR FLUID OR EDEMA. What changed: See the new instructions.  Jardiance 10 MG Tabs tablet Generic drug: empagliflozin TAKE 1 TABLET BY MOUTH EVERY DAY BEFORE BREAKFAST   loratadine 10 MG tablet Commonly known as: CLARITIN Take 10 mg by mouth daily as needed for allergies.   magnesium gluconate 500 MG tablet Commonly known as: MAGONATE Take 500 mg by mouth daily.   methocarbamol 500 MG tablet Commonly known as: ROBAXIN Take 1 tablet (500 mg total) by mouth every 6 (six) hours as needed for muscle spasms (muscle pain).   nitroGLYCERIN 0.4 MG SL tablet Commonly known as: NITROSTAT Place 1 tablet (0.4 mg total) under the tongue  every 5 (five) minutes x 3 doses as needed for chest pain.   OMEGA 3 PO Take 1 capsule by mouth daily.   oxyCODONE 5 MG immediate release tablet Commonly known as: Oxy IR/ROXICODONE Take 1 tablet (5 mg total) by mouth every 4 (four) hours as needed for severe pain.   polyethylene glycol 17 g packet Commonly known as: MIRALAX / GLYCOLAX Take 17 g by mouth 2 (two) times daily.   senna 8.6 MG Tabs tablet Commonly known as: SENOKOT Take 2 tablets (17.2 mg total) by mouth at bedtime for 14 days.   spironolactone 25 MG tablet Commonly known as: ALDACTONE TAKE 1 TABLET (25 MG TOTAL) BY MOUTH DAILY. NEEDS FOLLOW UP APPOINTMENT FOR MORE REFILLS   VITAMIN C PO Take 1 tablet by mouth at bedtime.   Vitamin D3 250 MCG (10000 UT) capsule Take 10,000 Units by mouth daily.               Discharge Care Instructions  (From admission, onward)           Start     Ordered   08/07/23 0000  Change dressing       Comments: Maintain surgical dressing until follow up in the clinic. If the edges start to pull up, may reinforce with tape. If the dressing is no longer working, may remove and cover with gauze and tape, but must keep the area dry and clean.  Call with any questions or concerns.   08/07/23 0744            Diagnostic Studies: DG HIP UNILAT WITH PELVIS 1V LEFT  Result Date: 08/06/2023 CLINICAL DATA:  Elective surgery.  The left hip arthroplasty. EXAM: DG HIP (WITH OR WITHOUT PELVIS) 1V*L* COMPARISON:  CT abdomen and pelvis 01/13/2020 FINDINGS: Images were performed intraoperatively without the presence of a radiologist. The patient is undergoing new total left hip arthroplasty. Partial visualization of prior total right hip arthroplasty. No hardware complication is seen. Total fluoroscopy images: 2 Total fluoroscopy time: 14 seconds Total dose: Radiation Exposure Index (as provided by the fluoroscopic device): 2.794 mGy air Kerma Please see intraoperative findings for further  detail. IMPRESSION: Intraoperative fluoroscopy for new total left hip arthroplasty. Electronically Signed   By: Neita Garnet M.D.   On: 08/06/2023 13:23   DG Pelvis Portable  Result Date: 08/06/2023 CLINICAL DATA:  Status post total hip arthroplasty, left. EXAM: PORTABLE PELVIS 1-2 VIEWS COMPARISON:  None Available. FINDINGS: Left hip arthroplasty in expected alignment. No periprosthetic lucency or fracture. Recent postsurgical change includes air and edema in the soft tissues. Prior right hip arthroplasty IMPRESSION: Left hip arthroplasty without immediate postoperative complication. Electronically Signed   By: Narda Rutherford M.D.   On: 08/06/2023 12:56   DG C-Arm 1-60 Min-No Report  Result Date: 08/06/2023 Fluoroscopy was utilized by the requesting physician.  No radiographic interpretation.    Disposition:  Discharge disposition: 01-Home or Self Care       Discharge Instructions     Call MD / Call 911   Complete by: As directed    If you experience chest pain or shortness of breath, CALL 911 and be transported to the hospital emergency room.  If you develope a fever above 101 F, pus (white drainage) or increased drainage or redness at the wound, or calf pain, call your surgeon's office.   Change dressing   Complete by: As directed    Maintain surgical dressing until follow up in the clinic. If the edges start to pull up, may reinforce with tape. If the dressing is no longer working, may remove and cover with gauze and tape, but must keep the area dry and clean.  Call with any questions or concerns.   Constipation Prevention   Complete by: As directed    Drink plenty of fluids.  Prune juice may be helpful.  You may use a stool softener, such as Colace (over the counter) 100 mg twice a day.  Use MiraLax (over the counter) for constipation as needed.   Diet - low sodium heart healthy   Complete by: As directed    Increase activity slowly as tolerated   Complete by: As directed     Weight bearing as tolerated with assist device (walker, cane, etc) as directed, use it as long as suggested by your surgeon or therapist, typically at least 4-6 weeks.   Post-operative opioid taper instructions:   Complete by: As directed    POST-OPERATIVE OPIOID TAPER INSTRUCTIONS: It is important to wean off of your opioid medication as soon as possible. If you do not need pain medication after your surgery it is ok to stop day one. Opioids include: Codeine, Hydrocodone(Norco, Vicodin), Oxycodone(Percocet, oxycontin) and hydromorphone amongst others.  Long term and even short term use of opiods can cause: Increased pain response Dependence Constipation Depression Respiratory depression And more.  Withdrawal symptoms can include Flu like symptoms Nausea, vomiting And more Techniques to manage these symptoms Hydrate well Eat regular healthy meals Stay active Use relaxation techniques(deep breathing, meditating, yoga) Do Not substitute Alcohol to help with tapering If you have been on opioids for less than two weeks and do not have pain than it is ok to stop all together.  Plan to wean off of opioids This plan should start within one week post op of your joint replacement. Maintain the same interval or time between taking each dose and first decrease the dose.  Cut the total daily intake of opioids by one tablet each day Next start to increase the time between doses. The last dose that should be eliminated is the evening dose.      TED hose   Complete by: As directed    Use stockings (TED hose) for 2 weeks on both leg(s).  You may remove them at night for sleeping.        Follow-up Information     Cassandria Anger, PA-C. Go on 08/21/2023.   Specialty: Orthopedic Surgery Why: You are scheduled for first post op appt on Wednesday October 2 at 9:30am. Contact information: [address] [place] [address] [phone]                   Signed: Cassandria Anger 08/20/2023, 7:16 AM

## 2023-09-18 DIAGNOSIS — Z96642 Presence of left artificial hip joint: Secondary | ICD-10-CM | POA: Diagnosis not present

## 2023-09-18 DIAGNOSIS — Z471 Aftercare following joint replacement surgery: Secondary | ICD-10-CM | POA: Diagnosis not present

## 2023-09-22 ENCOUNTER — Other Ambulatory Visit (HOSPITAL_COMMUNITY): Payer: Self-pay | Admitting: Adult Health

## 2023-10-17 ENCOUNTER — Other Ambulatory Visit (HOSPITAL_COMMUNITY): Payer: Self-pay | Admitting: Cardiology

## 2023-10-17 ENCOUNTER — Other Ambulatory Visit (HOSPITAL_COMMUNITY): Payer: Self-pay | Admitting: Family Medicine

## 2023-10-24 ENCOUNTER — Other Ambulatory Visit (HOSPITAL_COMMUNITY): Payer: Self-pay | Admitting: Cardiology

## 2023-10-28 NOTE — Progress Notes (Unsigned)
Cardiology Clinic Note   Patient Name: Walter Anderson Date of Encounter: 10/28/2023  Primary Care Provider:  Lonie Peak, PA-C Primary Cardiologist:  Nanetta Batty, MD  Patient Profile    Walter Anderson 77 year old male presents the clinic today for follow-up evaluation of his chronic systolic CHF.  Past Medical History    Past Medical History:  Diagnosis Date   Allergy    Arthritis    Asthma    seasonal   CHF (congestive heart failure) (HCC)    Hypertension    Myocardial infarction (HCC)    Sleep apnea    uses CPAP   Past Surgical History:  Procedure Laterality Date   BACK SURGERY  2006   CHOLECYSTECTOMY N/A 11/15/2015   Procedure: LAPAROSCOPIC CHOLECYSTECTOMY WITH INTRAOPERATIVE CHOLANGIOGRAM;  Surgeon: Kieth Brightly, MD;  Location: [facility];  Service: General;  Laterality: N/A;   CORONARY/GRAFT ACUTE MI REVASCULARIZATION N/A 09/11/2021   Procedure: Coronary/Graft Acute MI Revascularization;  Surgeon: Runell Gess, MD;  Location: [facility];  Service: Cardiovascular;  Laterality: N/A;   FINGER ARTHROSCOPY  1990   3rd Finger Left Hand   LEFT HEART CATH AND CORONARY ANGIOGRAPHY N/A 09/11/2021   Procedure: LEFT HEART CATH AND CORONARY ANGIOGRAPHY;  Surgeon: Runell Gess, MD;  Location: [facility];  Service: Cardiovascular;  Laterality: N/A;   ROTATOR CUFF REPAIR     right shoulder   TONSILLECTOMY  77 years old   TOTAL HIP ARTHROPLASTY Right 04/01/2018   Procedure: RIGHT TOTAL HIP ARTHROPLASTY ANTERIOR APPROACH;  Surgeon: Durene Romans, MD;  Location: [facility];  Service: Orthopedics;  Laterality: Right;  70 mins   TOTAL HIP ARTHROPLASTY Left 08/06/2023   Procedure: TOTAL HIP ARTHROPLASTY ANTERIOR APPROACH;  Surgeon: Durene Romans, MD;  Location: [facility];  Service: Orthopedics;  Laterality: Left;   UMBILICAL HERNIA REPAIR  11/15/2015   Procedure: HERNIA REPAIR UMBILICAL ADULT;  Surgeon: Kieth Brightly, MD;  Location: [facility];   Service: General;;    Allergies  No Known Allergies  History of Present Illness    Walter Anderson has a PMH of HTN, asthma, hip replacement 2018, OSA on CPAP, coronary artery disease status post STEMI with DES 09/11/2021, ischemic cardiomyopathy, and chronic systolic CHF.    He presented with STEMI 09/11/2021.  He underwent cardiac catheterization and received DES x1 to his mid LAD.  His follow-up echocardiogram showed an LVEF of 30-35% which was down from previous echocardiogram showing 55-60%.  He was placed on aspirin and Brilinta and started on Jardiance.  He followed up with the advanced heart failure clinic 09/21/2021.  During that time he reported that he was feeling okay.  He complained of fatigue.  He did note shortness of breath with ambulation.  He denied orthopnea and PND.  Reported that he slept with elevated head of bed chronically.  He denied chest pain and bleeding issues.  He reported following a low-sodium diet.  His weight had trended down from 288 to 281 pounds.  He reported compliance with his CPAP and medications.  He was continued on Entresto 24-26, spironolactone 12.5 mg daily, Jardiance 10 mg daily was added to his medication regimen and repeat echocardiogram was planned for 3 months.  He presented to the clinic 09/27/2021 for follow-up evaluation stated he felt well.  He was tolerating his medications without side effects and reported compliance.  We reviewed his angiography and he and his wife expressed understanding.  His blood pressure was 110/64.  His right radial cath site is clean dry intact and has no signs of infection.  He had been slowly increasing his physical activity and reported that he was ready to start rabbit hunting without his gun.  At the time we paused on up titration of his medication due to his recent titration. I planned to see him back in 1 month to further increase his medication.  I gave him a salty 6 diet sheet.  His EKG showed normal sinus rhythm  inferior infarct undetermined age, anterolateral infarct undetermined age 19 bpm.  We also reviewed his previous abdomen/pelvis CT which shows a atrophied scarred left kidney.  He presented  to the clinic 11/02/21 for follow-up evaluation stated he felt well.  He reported that he and his wife both had a respiratory illness over Thanksgiving.  His wife continued to recover.  He was less active during that time.  He was happy that he continued to lose weight.  He was 270 pounds.   He had been eating about 2 meals per day.  He was planning on getting out with his dogs to walk his property.  We discussed erectile dysfunction as well.  I reviewed his medications and instructed him that when he is able to walk up 2 flights of stairs he will be healthy enough to resume sexual activity.  I elected to hold off on prescribing extra medication at this time.  We discussed repeating his echocardiogram in 2 months to see about his left ventricular ejection fraction.   Plan for follow-up in 3 to 4 months.  He followed up with Dr. Allyson Sabal and was last seen by Prince Rome NP on 08/07/2022.  During that time he continued to feel okay.  He remained fatigued since his MI.  He reported dyspnea when pushing himself to work in his garden.  He denied palpitations, bleeding, chest pain, and lower extremity swelling.  His weight was 260 pounds.  He was taking Lasix every week.  Echocardiogram 2/23 showed an EF of 40-45%, periapical hypokinesis and normal RV function.  Follow-up was planned for 4 months.    He presented to the clinic 07/18/23 for follow-up evaluation and stated he had cardiac MRI on 03/11/2023.  It noted an EF of 42%, apical scarring and no evidence of blood clot in the left ventricle.  We reviewed this test and he and his wife expressed understanding.  He had recently seen  his PCP and noted to have hypotension.  His Entresto was reduced to half and his carvedilol was also cut in half.  He continued to use furosemide  as needed.  His weight was 271 pounds.  He was well compensated.  We reviewed the importance of low-sodium diet.  He had remained somewhat physically active and enjoyed working in his garden as well as running his dogs.  He had upcoming hip surgery planned.  I planned follow-up in 4 to 6 months.  He presents to the clinic today for follow-up evaluation and states***.  Today he denies chest pain, shortness of breath, lower extremity edema, fatigue, palpitations, melena, hematuria, hemoptysis, diaphoresis, weakness, presyncope, syncope, orthopnea, and PND.   Home Medications    Prior to Admission medications   Medication Sig Start Date End Date Taking? Authorizing Provider  acetaminophen (TYLENOL) 500 MG tablet Take 2,000 mg by mouth every 6 (six) hours as needed for pain.    [provider]  allopurinol (ZYLOPRIM) 300 MG tablet Take 300 mg by mouth daily as needed. 07/06/21  [provider]  amLODipine (NORVASC) 2.5 MG tablet Take 2.5 mg by mouth daily.    [provider]  Ascorbic Acid (VITAMIN C PO) Take 1 tablet by mouth daily.    [provider]  aspirin 81 MG EC tablet Take 81 mg by mouth daily.    [provider]  atorvastatin (LIPITOR) 80 MG tablet Take 1 tablet (80 mg total) by mouth daily. 09/14/21   Arty Baumgartner, NP  carvedilol (COREG) 3.125 MG tablet Take 1 tablet (3.125 mg total) by mouth 2 (two) times daily with a meal. 09/13/21   Laverda Page B, NP  Coenzyme Q10 400 MG CAPS Take 400 mg by mouth daily.     [provider]  docusate sodium (COLACE) 100 MG capsule Take 1 capsule (100 mg total) by mouth 2 (two) times daily. Patient taking differently: Take 100 mg by mouth daily as needed for mild constipation. 04/01/18   Lanney Gins, PA-C  doxazosin (CARDURA) 2 MG tablet Take 1 tablet (2 mg total) by mouth 2 (two) times daily. 09/21/21   Clegg, Amy D, NP  empagliflozin (JARDIANCE) 10 MG TABS tablet Take 1 tablet (10 mg  total) by mouth daily before breakfast. 09/21/21   Clegg, Amy D, NP  furosemide (LASIX) 40 MG tablet Take 1 tablet (40 mg total) by mouth daily as needed for fluid or edema. 09/21/21 09/21/22  Clegg, Amy D, NP  loratadine (CLARITIN) 10 MG tablet Take 10 mg by mouth daily as needed for allergies.    [provider]  magnesium gluconate (MAGONATE) 500 MG tablet Take 500 mg by mouth daily.    [provider]  nitroGLYCERIN (NITROSTAT) 0.4 MG SL tablet Place 1 tablet (0.4 mg total) under the tongue every 5 (five) minutes x 3 doses as needed for chest pain. 09/13/21   Arty Baumgartner, NP  Omega-3 Fatty Acids (OMEGA 3 PO) Take 1 capsule by mouth daily.    [provider]  sacubitril-valsartan (ENTRESTO) 24-26 MG Take 1 tablet by mouth 2 (two) times daily. 09/13/21   Arty Baumgartner, NP  spironolactone (ALDACTONE) 25 MG tablet Take 0.5 tablets (12.5 mg total) by mouth daily. 09/14/21   Runell Gess, MD  Testosterone 20.25 MG/ACT (1.62%) GEL Apply 1 application topically daily. 09/08/21   [provider]  ticagrelor (BRILINTA) 90 MG TABS tablet Take 1 tablet (90 mg total) by mouth 2 (two) times daily. 09/13/21   Arty Baumgartner, NP    Family History    Family History  Problem Relation Age of Onset   Diabetes Mother    COPD Father    Heart disease Father    He indicated that his mother is deceased. He indicated that his father is deceased.   Social History    Social History   Socioeconomic History   Marital status: Married    Spouse name: Walter Anderson   Number of children: 2   Years of education: Not on file   Highest education level: Associate degree: occupational, Scientist, product/process development, or vocational program  Occupational History   Occupation: heavy Media planner  Tobacco Use   Smoking status: Never   Smokeless tobacco: Former    Types: Chew    Quit date: 11/15/2005  Vaping Use   Vaping status: Never Used  Substance and Sexual Activity    Alcohol use: Not Currently    Alcohol/week: 1.0 standard drink of alcohol    Types: 1 Standard drinks or equivalent per week  Comment: 1-2 Weekly   Drug use: No   Sexual activity: Not Currently  Other Topics Concern   Not on file  Social History Narrative   Not on file   Social Determinants of Health   Financial Resource Strain: Low Risk  (09/13/2021)   Overall Financial Resource Strain (CARDIA)    Difficulty of Paying Living Expenses: Not very hard  Food Insecurity: No Food Insecurity (08/06/2023)   Hunger Vital Sign    Worried About Running Out of Food in the Last Year: Never true    Ran Out of Food in the Last Year: Never true  Transportation Needs: No Transportation Needs (08/06/2023)   PRAPARE - Administrator, Civil Service (Medical): No    Lack of Transportation (Non-Medical): No  Physical Activity: Not on file  Stress: Not on file  Social Connections: Not on file  Intimate Partner Violence: Not At Risk (08/06/2023)   Humiliation, Afraid, Rape, and Kick questionnaire    Fear of Current or Ex-Partner: No    Emotionally Abused: No    Physically Abused: No    Sexually Abused: No     Review of Systems    General:  No chills, fever, night sweats or weight changes.  Cardiovascular:  No chest pain, dyspnea on exertion, edema, orthopnea, palpitations, paroxysmal nocturnal dyspnea. Dermatological: No rash, lesions/masses Respiratory: No cough, dyspnea Urologic: No hematuria, dysuria Abdominal:   No nausea, vomiting, diarrhea, bright red blood per rectum, melena, or hematemesis Neurologic:  No visual changes, wkns, changes in mental status. All other systems reviewed and are otherwise negative except as noted above.  Physical Exam    VS:  There were no vitals taken for this visit. , BMI There is no height or weight on file to calculate BMI. GEN: Well nourished, well developed, in no acute distress. HEENT: normal. Neck: Supple, no JVD, carotid bruits, or  masses. Cardiac: RRR, no murmurs, rubs, or gallops. No clubbing, cyanosis, edema.  Radials/DP/PT 2+ and equal bilaterally.  Respiratory:  Respirations regular and unlabored, clear to auscultation bilaterally. GI: Soft, nontender, nondistended, BS + x 4. MS: no deformity or atrophy. Skin: warm and dry, no rash.   Neuro:  Strength and sensation are intact. Psych: Normal affect.  Accessory Clinical Findings    Recent Labs: 08/07/2023: BUN 21; Creatinine, Ser 0.95; Hemoglobin 10.0; Platelets 121; Potassium 4.3; Sodium 131   Recent Lipid Panel    Component Value Date/Time   CHOL 100 01/02/2022 1126   TRIG 65 01/02/2022 1126   HDL 30 (L) 01/02/2022 1126   CHOLHDL 3.3 01/02/2022 1126   VLDL 13 01/02/2022 1126   LDLCALC 57 01/02/2022 1126    ECG personally reviewed by me today-EKG Interpretation Date/Time:    Ventricular Rate:    PR Interval:    QRS Duration:    QT Interval:    QTC Calculation:   R Axis:      Text Interpretation:     EKG 09/27/2021 normal sinus rhythm left axis deviation inferior infarct undetermined age anterior lateral infarct undetermined age 11 bpm- No acute changes  Cardiac catheterization 09/11/2021   Mid LAD lesion is 100% stenosed.   A drug-eluting stent was successfully placed using a STENT ONYX FRONTIER 3.0X15.   Post intervention, there is a 0% residual stenosis.   Walter Anderson is a 77 y.o. male      [id number] LOCATION:  FACILITY: [facility]  PHYSICIAN: Nanetta Batty, M.D. 07-24-1946     DATE OF PROCEDURE:  09/11/2021  Diagnostic Dominance: Left Intervention    Echocardiogram 09/12/2021  IMPRESSIONS     1. Mid septum into apex is akinetic consistent with large LAD infarction.  No LV thrombus on contrast imaging. Left ventricular ejection fraction, by  estimation, is 30 to 35%. The left ventricle has moderately decreased  function. The left ventricle  demonstrates regional wall motion abnormalities (see scoring  diagram/findings for  description). There is mild left ventricular  hypertrophy. Left ventricular diastolic parameters are consistent with  Grade II diastolic dysfunction (pseudonormalization).   2. Right ventricular systolic function is normal. The right ventricular  size is normal. Tricuspid regurgitation signal is inadequate for assessing  PA pressure.   3. The mitral valve is grossly normal. Trivial mitral valve  regurgitation. No evidence of mitral stenosis.   4. The aortic valve is tricuspid. There is moderate calcification of the  aortic valve. There is mild thickening of the aortic valve. Aortic valve  regurgitation is not visualized. Mild to moderate aortic valve  sclerosis/calcification is present, without  any evidence of aortic stenosis.   5. The inferior vena cava is dilated in size with >50% respiratory  variability, suggesting right atrial pressure of 8 mmHg.   Comparison(s): Changes from prior study are noted. The left ventricular  function is significantly worse. New WMAs consistent with LAD infarction.   Cardiac MRI 03/11/2023  IMPRESSION: 1. Normal LV size with peri-apical wall motion abnormalities as noted above, EF 42%.   2.  Normal RV size and systolic function, EF 50%.   3. Delayed enhancement pattern suggests prior LAD-territory infarction involving primarily the apex and peri-apical segments.   4.  I do not see a definite LV thrombus.   Walter Anderson     Electronically Signed   By: Marca Ancona M.D.   On: 03/11/2023 18:30  Assessment & Plan   1.  Ischemic cardiomyopathy-continues to maintain weight around 260 pounds.  No increased work of breathing.  Continues to be fairly  physically active .   Continue current medical therapy Heart healthy low-sodium diet-reviewed Maintain physical activity   Coronary artery disease-denies recent anginal symptoms at rest or with exertion.  Underwent catheterization 09/11/2021 and received DES x1 to his LAD.  Echocardiogram showed a  reduction in his ejection fraction down to 30-35%.  Follow-up echocardiogram 20/23 showed EF 40-45%. Continue aspirin, amlodipine, atorvastatin, nitroglycerin Maintain physical activity  Essential hypertension-BP today 105***8/62  Maintain blood pressure log Continue  carvedilol, Entresto, spironolactone Low-sodium diet  Morbid obesity-weight today 27***1.  Reduce calorie diet Maintain physical activity Continue weight loss/weight log      Disposition: Follow-up with Dr. Allyson Sabal or me in 6-9 months.   Walter Anderson    10/28/2023, 7:28 AM [facility] [address] Office [phone] Fax [phone]  Notice: This dictation was prepared with Dragon dictation along with smaller phrase technology. Any transcriptional errors that result from this process are unintentional and may not be corrected upon review.  I spent 14*** minutes examining this patient, reviewing medications, and using patient centered shared decision making involving her cardiac care.  Prior to her visit I spent greater than 20 minutes reviewing her past medical history,  medications, and prior cardiac tests.

## 2023-10-30 ENCOUNTER — Ambulatory Visit: Payer: Medicare PPO | Attending: General Practice | Admitting: General Practice

## 2023-10-30 ENCOUNTER — Encounter: Payer: Self-pay | Admitting: General Practice

## 2023-10-30 VITALS — BP 112/68 | HR 64 | Wt 274.0 lb

## 2023-10-30 DIAGNOSIS — I2510 Atherosclerotic heart disease of native coronary artery without angina pectoris: Secondary | ICD-10-CM | POA: Diagnosis not present

## 2023-10-30 DIAGNOSIS — I255 Ischemic cardiomyopathy: Secondary | ICD-10-CM | POA: Diagnosis not present

## 2023-10-30 DIAGNOSIS — I10 Essential (primary) hypertension: Secondary | ICD-10-CM

## 2023-10-30 NOTE — Patient Instructions (Signed)
Medication Instructions:  Continue current medications *If you need a refill on your cardiac medications before your next appointment, please call your pharmacy*   Lab Work: none If you have labs (blood work) drawn today and your tests are completely normal, you will receive your results only by: MyChart Message (if you have MyChart) OR A paper copy in the mail If you have any lab test that is abnormal or we need to change your treatment, we will call you to review the results.   Testing/Procedures: none   Follow-Up: At [facility], you and your health needs are our priority.  As part of our continuing mission to provide you with exceptional heart care, we have created designated Provider Care Teams.  These Care Teams include your primary Cardiologist (physician) and Advanced Practice Providers (APPs -  Physician Assistants and Nurse Practitioners) who all work together to provide you with the care you need, when you need it.  We recommend signing up for the patient portal called "MyChart".  Sign up information is provided on this After Visit Summary.  MyChart is used to connect with patients for Virtual Visits (Telemedicine).  Patients are able to view lab/test results, encounter notes, upcoming appointments, etc.  Non-urgent messages can be sent to your provider as well.   To learn more about what you can do with MyChart, go to [email or web address].    Your next appointment:   9 month(s)  Provider:   Oris Drone. NP  Other Instructions Please reduce Carbohydrates in your diet and Calories Please increase your Physical exercise  as tolerated and fiber in your diet

## 2023-12-20 ENCOUNTER — Other Ambulatory Visit (HOSPITAL_COMMUNITY): Payer: Self-pay | Admitting: Adult Health

## 2023-12-20 MED ORDER — EMPAGLIFLOZIN 10 MG PO TABS: 10.0000 mg | ORAL_TABLET | Freq: Every day | ORAL | 3 refills | Status: DC

## 2023-12-30 DIAGNOSIS — I2510 Atherosclerotic heart disease of native coronary artery without angina pectoris: Secondary | ICD-10-CM | POA: Diagnosis not present

## 2023-12-30 DIAGNOSIS — M109 Gout, unspecified: Secondary | ICD-10-CM | POA: Diagnosis not present

## 2023-12-30 DIAGNOSIS — I10 Essential (primary) hypertension: Secondary | ICD-10-CM | POA: Diagnosis not present

## 2023-12-30 DIAGNOSIS — Z139 Encounter for screening, unspecified: Secondary | ICD-10-CM | POA: Diagnosis not present

## 2023-12-30 DIAGNOSIS — G4733 Obstructive sleep apnea (adult) (pediatric): Secondary | ICD-10-CM | POA: Diagnosis not present

## 2023-12-30 DIAGNOSIS — Z1331 Encounter for screening for depression: Secondary | ICD-10-CM | POA: Diagnosis not present

## 2023-12-30 DIAGNOSIS — I5022 Chronic systolic (congestive) heart failure: Secondary | ICD-10-CM | POA: Diagnosis not present

## 2023-12-30 DIAGNOSIS — E349 Endocrine disorder, unspecified: Secondary | ICD-10-CM | POA: Diagnosis not present

## 2023-12-30 DIAGNOSIS — R7303 Prediabetes: Secondary | ICD-10-CM | POA: Diagnosis not present

## 2023-12-30 DIAGNOSIS — Z79899 Other long term (current) drug therapy: Secondary | ICD-10-CM | POA: Diagnosis not present

## 2024-01-22 ENCOUNTER — Other Ambulatory Visit (HOSPITAL_COMMUNITY): Payer: Self-pay | Admitting: Cardiology

## 2024-01-27 DIAGNOSIS — G4733 Obstructive sleep apnea (adult) (pediatric): Secondary | ICD-10-CM | POA: Diagnosis not present

## 2024-01-28 ENCOUNTER — Other Ambulatory Visit (HOSPITAL_COMMUNITY): Payer: Self-pay | Admitting: Family Medicine

## 2024-01-28 ENCOUNTER — Other Ambulatory Visit (HOSPITAL_COMMUNITY): Payer: Self-pay | Admitting: Cardiology

## 2024-04-08 ENCOUNTER — Other Ambulatory Visit (HOSPITAL_COMMUNITY): Payer: Self-pay | Admitting: Cardiology

## 2024-04-16 ENCOUNTER — Other Ambulatory Visit (HOSPITAL_COMMUNITY): Payer: Self-pay | Admitting: Cardiology

## 2024-04-24 ENCOUNTER — Other Ambulatory Visit (HOSPITAL_COMMUNITY): Payer: Self-pay | Admitting: Cardiology

## 2024-04-24 ENCOUNTER — Telehealth (HOSPITAL_COMMUNITY): Payer: Self-pay | Admitting: Cardiology

## 2024-04-24 ENCOUNTER — Other Ambulatory Visit (HOSPITAL_COMMUNITY): Payer: Self-pay | Admitting: Family Medicine

## 2024-04-24 NOTE — Telephone Encounter (Signed)
 front office left pt a message to schedule a f/u appt with Dr. Mitzie Anda in the [facility]. Received message from Pang[place]ch T, CMA, that this pt needs a f/u appt.

## 2024-06-29 DIAGNOSIS — Z79899 Other long term (current) drug therapy: Secondary | ICD-10-CM | POA: Diagnosis not present

## 2024-06-29 DIAGNOSIS — R7303 Prediabetes: Secondary | ICD-10-CM | POA: Diagnosis not present

## 2024-06-29 DIAGNOSIS — Z9181 History of falling: Secondary | ICD-10-CM | POA: Diagnosis not present

## 2024-06-29 DIAGNOSIS — I5022 Chronic systolic (congestive) heart failure: Secondary | ICD-10-CM | POA: Diagnosis not present

## 2024-06-29 DIAGNOSIS — I2510 Atherosclerotic heart disease of native coronary artery without angina pectoris: Secondary | ICD-10-CM | POA: Diagnosis not present

## 2024-06-29 DIAGNOSIS — Z125 Encounter for screening for malignant neoplasm of prostate: Secondary | ICD-10-CM | POA: Diagnosis not present

## 2024-06-29 DIAGNOSIS — E349 Endocrine disorder, unspecified: Secondary | ICD-10-CM | POA: Diagnosis not present

## 2024-06-29 DIAGNOSIS — I10 Essential (primary) hypertension: Secondary | ICD-10-CM | POA: Diagnosis not present

## 2024-06-29 DIAGNOSIS — M109 Gout, unspecified: Secondary | ICD-10-CM | POA: Diagnosis not present

## 2024-06-29 DIAGNOSIS — G4733 Obstructive sleep apnea (adult) (pediatric): Secondary | ICD-10-CM | POA: Diagnosis not present

## 2024-06-29 DIAGNOSIS — Z23 Encounter for immunization: Secondary | ICD-10-CM | POA: Diagnosis not present

## 2024-07-20 ENCOUNTER — Other Ambulatory Visit (HOSPITAL_COMMUNITY): Payer: Self-pay | Admitting: Cardiology

## 2024-07-24 ENCOUNTER — Other Ambulatory Visit (HOSPITAL_COMMUNITY): Payer: Self-pay | Admitting: Cardiology

## 2024-07-24 ENCOUNTER — Other Ambulatory Visit (HOSPITAL_COMMUNITY): Payer: Self-pay | Admitting: Family Medicine

## 2024-07-27 ENCOUNTER — Encounter: Payer: Self-pay | Admitting: Cardiovascular Disease

## 2024-07-27 ENCOUNTER — Ambulatory Visit: Attending: Cardiovascular Disease | Admitting: Cardiovascular Disease

## 2024-07-27 VITALS — BP 118/68 | HR 55 | Ht 72.0 in | Wt 280.0 lb

## 2024-07-27 DIAGNOSIS — I2510 Atherosclerotic heart disease of native coronary artery without angina pectoris: Secondary | ICD-10-CM | POA: Diagnosis not present

## 2024-07-27 DIAGNOSIS — E782 Mixed hyperlipidemia: Secondary | ICD-10-CM

## 2024-07-27 DIAGNOSIS — I10 Essential (primary) hypertension: Secondary | ICD-10-CM | POA: Diagnosis not present

## 2024-07-27 DIAGNOSIS — G4733 Obstructive sleep apnea (adult) (pediatric): Secondary | ICD-10-CM | POA: Diagnosis not present

## 2024-07-27 DIAGNOSIS — I2102 ST elevation (STEMI) myocardial infarction involving left anterior descending coronary artery: Secondary | ICD-10-CM | POA: Diagnosis not present

## 2024-07-27 DIAGNOSIS — I255 Ischemic cardiomyopathy: Secondary | ICD-10-CM

## 2024-07-27 NOTE — Progress Notes (Signed)
 07/27/2024 JAAZIAH SCHULKE   09-Feb-1946  [id number]  Primary Physician Montey Lot, PA-C Primary Cardiologist: Dorn JINNY Lesches MD FACP, [place], [place], FSCAI  HPI:  Walter Anderson is a 78 y.o.  severely overweight married Caucasian male father of 2, grandfather of 4 grandchildren who I last saw in the office 07/25/2022.  He is accompanied by his wife Walter Anderson today.  He works as an Financial trader.  His primary provider is Lot Montey, PA-C, in [place] .  He has a history of treated hypertension, diabetes and hyperlipidemia.  He also has obstructive sleep apnea on CPAP.  He had an anterior STEMI 09/11/2021.  I opened up a occluded mid LAD with a Medtronic frontier 3 mm x 15 mm long drug-eluting stent postdilated to 3.25 mm with a door to balloon time of 37 minutes.  He was discharged home on a LifeVest.  He has seen Dr. Rolan in the heart failure clinic.  His most recent 2D echo revealed an increase in his LV function from 30 to 35% of the time of his myocardial infarction up to 40 to 45% by 2D echo 01/02/2022.  He is on guideline directed optimal medical therapy.   Since I saw him in the office 2 years ago he continues to do well.  He has had a hip replacement.  He denies chest pain or shortness of breath.   Current Meds  Medication Sig   acetaminophen  (TYLENOL ) 500 MG tablet Take 2,000 mg by mouth every 6 (six) hours as needed for pain.   allopurinol  (ZYLOPRIM ) 300 MG tablet Take 300 mg by mouth daily.   Ascorbic Acid (VITAMIN C PO) Take 1 tablet by mouth at bedtime.   aspirin  EC 81 MG tablet Take 81 mg by mouth daily. Swallow whole.   atorvastatin  (LIPITOR ) 80 MG tablet Take 1 tablet (80 mg total) by mouth daily. PLEASE SCHEDULE APPOINTMENT FOR MORE REFILLS   carvedilol  (COREG ) 6.25 MG tablet TAKE 1 TABLET BY MOUTH 2 (TWO) TIMES DAILY WITH A MEAL. NEEDS FOLLOW UP APPOINTMENT FOR REFILLS (Patient taking differently: Patient takes 1 in the morning)   Cholecalciferol (VITAMIN D3) 250 MCG  (10000 UT) capsule Take 10,000 Units by mouth daily.   Coenzyme Q10 400 MG CAPS Take 400 mg by mouth 2 (two) times daily.   docusate sodium  (COLACE) 100 MG capsule Take 100 mg by mouth daily as needed for mild constipation.   empagliflozin  (JARDIANCE ) 10 MG TABS tablet Take 1 tablet (10 mg total) by mouth daily.   ENTRESTO  97-103 MG TAKE 1 TABLET BY MOUTH TWICE A DAY   furosemide  (LASIX ) 40 MG tablet TAKE 1 TABLET BY MOUTH DAILY AS NEEDED FOR FLUID OR EDEMA.   loratadine  (CLARITIN ) 10 MG tablet Take 10 mg by mouth daily as needed for allergies.   nitroGLYCERIN  (NITROSTAT ) 0.4 MG SL tablet Place 1 tablet (0.4 mg total) under the tongue every 5 (five) minutes x 3 doses as needed for chest pain.   Omega-3 Fatty Acids (OMEGA 3 PO) Take 1 capsule by mouth daily.   spironolactone  (ALDACTONE ) 25 MG tablet TAKE 1 TABLET (25 MG TOTAL) BY MOUTH DAILY. NEEDS FOLLOW UP APPOINTMENT FOR MORE REFILLS     No Known Allergies  Social History   Socioeconomic History   Marital status: Married    Spouse name: Walter Anderson   Number of children: 2   Years of education: Not on file   Highest education level: Associate degree: occupational, Scientist, product/process development, or vocational  program  Occupational History   Occupation: heavy Media planner  Tobacco Use   Smoking status: Never   Smokeless tobacco: Former    Types: Chew    Quit date: 11/15/2005  Vaping Use   Vaping status: Never Used  Substance and Sexual Activity   Alcohol use: Not Currently    Alcohol/week: 1.0 standard drink of alcohol    Types: 1 Standard drinks or equivalent per week    Comment: 1-2 Weekly   Drug use: No   Sexual activity: Not Currently  Other Topics Concern   Not on file  Social History Narrative   Not on file   Social Drivers of Health   Financial Resource Strain: Low Risk  (09/13/2021)   Overall Financial Resource Strain (CARDIA)    Difficulty of Paying Living Expenses: Not very hard  Food Insecurity: No Food Insecurity  (08/06/2023)   Hunger Vital Sign    Worried About Running Out of Food in the Last Year: Never true    Ran Out of Food in the Last Year: Never true  Transportation Needs: No Transportation Needs (08/06/2023)   PRAPARE - Administrator, Civil Service (Medical): No    Lack of Transportation (Non-Medical): No  Physical Activity: Not on file  Stress: Not on file  Social Connections: Not on file  Intimate Partner Violence: Not At Risk (08/06/2023)   Humiliation, Afraid, Rape, and Kick questionnaire    Fear of Current or Ex-Partner: No    Emotionally Abused: No    Physically Abused: No    Sexually Abused: No     Review of Systems: General: negative for chills, fever, night sweats or weight changes.  Cardiovascular: negative for chest pain, dyspnea on exertion, edema, orthopnea, palpitations, paroxysmal nocturnal dyspnea or shortness of breath Dermatological: negative for rash Respiratory: negative for cough or wheezing Urologic: negative for hematuria Abdominal: negative for nausea, vomiting, diarrhea, bright red blood per rectum, melena, or hematemesis Neurologic: negative for visual changes, syncope, or dizziness All other systems reviewed and are otherwise negative except as noted above.    Blood pressure 118/68, pulse (!) 55, height 6' (1.829 m), weight 280 lb (127 kg), SpO2 96%.  General appearance: alert and no distress Neck: no adenopathy, no carotid bruit, no JVD, supple, symmetrical, trachea midline, and thyroid  not enlarged, symmetric, no tenderness/mass/nodules Lungs: clear to auscultation bilaterally Heart: regular rate and rhythm, S1, S2 normal, no murmur, click, rub or gallop Extremities: extremities normal, atraumatic, no cyanosis or edema Pulses: 2+ and symmetric Skin: Skin color, texture, turgor normal. No rashes or lesions Neurologic: Grossly normal  EKG EKG Interpretation Date/Time:  Monday July 27 2024 10:12:38 EDT Ventricular Rate:  55 PR  Interval:  168 QRS Duration:  94 QT Interval:  404 QTC Calculation: 386 R Axis:   -67  Text Interpretation: Sinus bradycardia Left axis deviation Low voltage QRS Inferior infarct (cited on or before 27-Jul-2024) Anterolateral infarct (cited on or before 27-Jul-2024) When compared with ECG of 30-Oct-2023 09:20, No significant change was found Confirmed by Court Carrier [phone]) on 07/27/2024 10:22:34 AM    ASSESSMENT AND PLAN:   STEMI (ST elevation myocardial infarction) (HCC) History of anterior STEMI 09/07/2021.  I brought him to the Cath Lab and placed a Medtronic frontier 3 mm x 15 mm long drug-eluting stent in his mid LAD postdilated to 3.25 mm with a door to balloon time of 37 minutes.  He has been asymptomatic since denying chest pain or shortness of breath.  He  remains on a baby aspirin .  Hyperlipidemia History of hyperlipidemia on high-dose statin therapy lipid profile performed 06/29/2024 revealing total cholesterol 118, LDL 66 and HDL 33.  Hypertension History of essential hypertension with blood pressure measured today at 118/68.  He is on carvedilol  and Entresto .  Ischemic cardiomyopathy History of ischemic cardiomyopathy on GDMT with his most recent echo performed 11/29/2022 revealed an EF of 40 to 45% without valvular abnormalities.  A cardiac MRI did not suggest LV thrombus.  Obstructive sleep apnea History of obstructive sleep apnea on CPAP.     Dorn DOROTHA Lesches MD FACP,FACC,FAHA, [facility] 07/27/2024 10:31 AM

## 2024-07-27 NOTE — Assessment & Plan Note (Signed)
 History of obstructive sleep apnea on CPAP.

## 2024-07-27 NOTE — Assessment & Plan Note (Signed)
 History of anterior STEMI 09/07/2021.  I brought him to the Cath Lab and placed a Medtronic frontier 3 mm x 15 mm long drug-eluting stent in his mid LAD postdilated to 3.25 mm with a door to balloon time of 37 minutes.  He has been asymptomatic since denying chest pain or shortness of breath.  He remains on a baby aspirin .

## 2024-07-27 NOTE — Assessment & Plan Note (Signed)
 History of hyperlipidemia on high-dose statin therapy lipid profile performed 06/29/2024 revealing total cholesterol 118, LDL 66 and HDL 33.

## 2024-07-27 NOTE — Assessment & Plan Note (Signed)
 History of essential hypertension with blood pressure measured today at 118/68.  He is on carvedilol  and Entresto .

## 2024-07-27 NOTE — Assessment & Plan Note (Signed)
 History of ischemic cardiomyopathy on GDMT with his most recent echo performed 11/29/2022 revealed an EF of 40 to 45% without valvular abnormalities.  A cardiac MRI did not suggest LV thrombus.

## 2024-07-27 NOTE — Patient Instructions (Signed)
 Medication Instructions:  Your physician recommends that you continue on your current medications as directed. Please refer to the Current Medication list given to you today.  *If you need a refill on your cardiac medications before your next appointment, please call your pharmacy*   Follow-Up: At [facility], you and your health needs are our priority.  As part of our continuing mission to provide you with exceptional heart care, our providers are all part of one team.  This team includes your primary Cardiologist (physician) and Advanced Practice Providers or APPs (Physician Assistants and Nurse Practitioners) who all work together to provide you with the care you need, when you need it.  Your next appointment:   12 month(s)  Provider:   Lawana Pray, NP         Then, Lauro Portal, MD will plan to see you again in 2 year(s).    We recommend signing up for the patient portal called MyChart.  Sign up information is provided on this After Visit Summary.  MyChart is used to connect with patients for Virtual Visits (Telemedicine).  Patients are able to view lab/test results, encounter notes, upcoming appointments, etc.  Non-urgent messages can be sent to your provider as well.   To learn more about what you can do with MyChart, go to [email or web address].

## 2024-08-12 DIAGNOSIS — G4733 Obstructive sleep apnea (adult) (pediatric): Secondary | ICD-10-CM | POA: Diagnosis not present

## 2024-08-28 DIAGNOSIS — Z23 Encounter for immunization: Secondary | ICD-10-CM | POA: Diagnosis not present

## 2024-10-14 DIAGNOSIS — Z96642 Presence of left artificial hip joint: Secondary | ICD-10-CM | POA: Diagnosis not present

## 2024-10-18 ENCOUNTER — Other Ambulatory Visit (HOSPITAL_COMMUNITY): Payer: Self-pay | Admitting: Cardiology

## 2024-10-21 ENCOUNTER — Other Ambulatory Visit (HOSPITAL_COMMUNITY): Payer: Self-pay | Admitting: Family Medicine

## 2024-10-21 ENCOUNTER — Other Ambulatory Visit (HOSPITAL_COMMUNITY): Payer: Self-pay | Admitting: Cardiology

## 2024-11-14 ENCOUNTER — Other Ambulatory Visit (HOSPITAL_COMMUNITY): Payer: Self-pay | Admitting: Family Medicine

## 2024-11-14 ENCOUNTER — Other Ambulatory Visit (HOSPITAL_COMMUNITY): Payer: Self-pay | Admitting: Cardiology

## 2024-12-07 ENCOUNTER — Other Ambulatory Visit (HOSPITAL_COMMUNITY): Payer: Self-pay | Admitting: General Practice
